# Patient Record
Sex: Female | Born: 1960 | Race: White | Hispanic: No | Smoking: Current every day smoker
Health system: Southern US, Community
[De-identification: ages and names within clinical notes are randomized; demographics above are authoritative.]

## PROBLEM LIST (undated history)

## (undated) DIAGNOSIS — J069 Acute upper respiratory infection, unspecified: Secondary | ICD-10-CM

## (undated) DIAGNOSIS — R569 Unspecified convulsions: Secondary | ICD-10-CM

## (undated) DIAGNOSIS — D839 Common variable immunodeficiency, unspecified: Secondary | ICD-10-CM

## (undated) DIAGNOSIS — J45909 Unspecified asthma, uncomplicated: Secondary | ICD-10-CM

## (undated) DIAGNOSIS — E119 Type 2 diabetes mellitus without complications: Secondary | ICD-10-CM

## (undated) DIAGNOSIS — I10 Essential (primary) hypertension: Secondary | ICD-10-CM

## (undated) DIAGNOSIS — I208 Other forms of angina pectoris: Secondary | ICD-10-CM

## (undated) HISTORY — PX: OTHER SURGICAL HISTORY: SHX169

## (undated) HISTORY — DX: Acute upper respiratory infection, unspecified: J06.9

## (undated) HISTORY — PX: APPENDECTOMY: SHX54

## (undated) HISTORY — DX: Common variable immunodeficiency, unspecified: D83.9

## (undated) HISTORY — PX: HERNIA REPAIR: SHX51

## (undated) HISTORY — DX: Type 2 diabetes mellitus without complications: E11.9

## (undated) HISTORY — PX: CHOLECYSTECTOMY: SHX55

## (undated) HISTORY — PX: BACK SURGERY: SHX140

## (undated) HISTORY — PX: SHOULDER SURGERY: SHX246

## (undated) HISTORY — DX: Unspecified asthma, uncomplicated: J45.909

## (undated) HISTORY — DX: Other forms of angina pectoris: I20.8

---

## 1998-11-26 ENCOUNTER — Observation Stay (HOSPITAL_COMMUNITY): Admission: RE | Admit: 1998-11-26 | Discharge: 1998-11-27 | Payer: Self-pay | Admitting: Specialist

## 1998-11-26 ENCOUNTER — Encounter: Payer: Self-pay | Admitting: Specialist

## 1999-03-31 ENCOUNTER — Encounter: Payer: Self-pay | Admitting: Specialist

## 1999-03-31 ENCOUNTER — Ambulatory Visit (HOSPITAL_COMMUNITY): Admission: RE | Admit: 1999-03-31 | Discharge: 1999-03-31 | Payer: Self-pay | Admitting: Specialist

## 1999-06-25 ENCOUNTER — Encounter: Payer: Self-pay | Admitting: Specialist

## 1999-06-25 ENCOUNTER — Ambulatory Visit (HOSPITAL_COMMUNITY): Admission: RE | Admit: 1999-06-25 | Discharge: 1999-06-25 | Payer: Self-pay | Admitting: Specialist

## 1999-07-14 ENCOUNTER — Encounter: Payer: Self-pay | Admitting: Specialist

## 1999-07-15 ENCOUNTER — Inpatient Hospital Stay (HOSPITAL_COMMUNITY): Admission: EM | Admit: 1999-07-15 | Discharge: 1999-07-16 | Payer: Self-pay | Admitting: Specialist

## 2000-03-04 ENCOUNTER — Ambulatory Visit (HOSPITAL_COMMUNITY): Admission: RE | Admit: 2000-03-04 | Discharge: 2000-03-04 | Payer: Self-pay | Admitting: Specialist

## 2000-03-04 ENCOUNTER — Encounter: Payer: Self-pay | Admitting: Specialist

## 2000-06-01 ENCOUNTER — Encounter: Payer: Self-pay | Admitting: Specialist

## 2000-06-08 ENCOUNTER — Inpatient Hospital Stay (HOSPITAL_COMMUNITY): Admission: RE | Admit: 2000-06-08 | Discharge: 2000-06-12 | Payer: Self-pay | Admitting: Specialist

## 2000-06-08 ENCOUNTER — Encounter: Payer: Self-pay | Admitting: Specialist

## 2001-01-11 ENCOUNTER — Encounter: Payer: Self-pay | Admitting: Specialist

## 2001-01-11 ENCOUNTER — Inpatient Hospital Stay (HOSPITAL_COMMUNITY): Admission: RE | Admit: 2001-01-11 | Discharge: 2001-01-13 | Payer: Self-pay | Admitting: Specialist

## 2001-07-19 ENCOUNTER — Ambulatory Visit (HOSPITAL_COMMUNITY): Admission: RE | Admit: 2001-07-19 | Discharge: 2001-07-19 | Payer: Self-pay | Admitting: Specialist

## 2001-07-19 ENCOUNTER — Encounter: Payer: Self-pay | Admitting: Specialist

## 2001-10-06 ENCOUNTER — Encounter: Admission: RE | Admit: 2001-10-06 | Discharge: 2002-01-04 | Payer: Self-pay

## 2002-01-30 ENCOUNTER — Encounter: Admission: RE | Admit: 2002-01-30 | Discharge: 2002-04-30 | Payer: Self-pay

## 2002-05-22 ENCOUNTER — Encounter: Admission: RE | Admit: 2002-05-22 | Discharge: 2002-05-29 | Payer: Self-pay

## 2002-06-15 ENCOUNTER — Encounter: Admission: RE | Admit: 2002-06-15 | Discharge: 2002-09-13 | Payer: Self-pay

## 2004-01-04 ENCOUNTER — Emergency Department (HOSPITAL_COMMUNITY): Admission: EM | Admit: 2004-01-04 | Discharge: 2004-01-04 | Payer: Self-pay | Admitting: Emergency Medicine

## 2006-03-09 ENCOUNTER — Emergency Department (HOSPITAL_COMMUNITY): Admission: EM | Admit: 2006-03-09 | Discharge: 2006-03-09 | Payer: Self-pay | Admitting: Emergency Medicine

## 2011-08-31 DIAGNOSIS — I10 Essential (primary) hypertension: Secondary | ICD-10-CM | POA: Diagnosis not present

## 2011-08-31 DIAGNOSIS — I2089 Other forms of angina pectoris: Secondary | ICD-10-CM | POA: Insufficient documentation

## 2011-08-31 DIAGNOSIS — R0789 Other chest pain: Secondary | ICD-10-CM

## 2011-08-31 DIAGNOSIS — J40 Bronchitis, not specified as acute or chronic: Secondary | ICD-10-CM | POA: Diagnosis not present

## 2011-08-31 DIAGNOSIS — I208 Other forms of angina pectoris: Secondary | ICD-10-CM

## 2011-08-31 DIAGNOSIS — Z79899 Other long term (current) drug therapy: Secondary | ICD-10-CM | POA: Diagnosis not present

## 2011-08-31 DIAGNOSIS — F17200 Nicotine dependence, unspecified, uncomplicated: Secondary | ICD-10-CM | POA: Diagnosis not present

## 2011-08-31 DIAGNOSIS — J111 Influenza due to unidentified influenza virus with other respiratory manifestations: Secondary | ICD-10-CM | POA: Diagnosis not present

## 2011-08-31 DIAGNOSIS — I4891 Unspecified atrial fibrillation: Secondary | ICD-10-CM | POA: Diagnosis not present

## 2011-08-31 DIAGNOSIS — R05 Cough: Secondary | ICD-10-CM | POA: Diagnosis not present

## 2011-08-31 DIAGNOSIS — E785 Hyperlipidemia, unspecified: Secondary | ICD-10-CM | POA: Diagnosis not present

## 2011-08-31 DIAGNOSIS — J45909 Unspecified asthma, uncomplicated: Secondary | ICD-10-CM | POA: Diagnosis not present

## 2011-08-31 HISTORY — DX: Other chest pain: R07.89

## 2011-08-31 HISTORY — DX: Other forms of angina pectoris: I20.89

## 2011-08-31 HISTORY — DX: Other forms of angina pectoris: I20.8

## 2011-09-08 DIAGNOSIS — H04129 Dry eye syndrome of unspecified lacrimal gland: Secondary | ICD-10-CM | POA: Diagnosis not present

## 2011-09-08 DIAGNOSIS — H401290 Low-tension glaucoma, unspecified eye, stage unspecified: Secondary | ICD-10-CM | POA: Diagnosis not present

## 2011-09-08 DIAGNOSIS — H47219 Primary optic atrophy, unspecified eye: Secondary | ICD-10-CM | POA: Diagnosis not present

## 2011-09-08 DIAGNOSIS — R002 Palpitations: Secondary | ICD-10-CM | POA: Diagnosis not present

## 2011-09-08 DIAGNOSIS — R079 Chest pain, unspecified: Secondary | ICD-10-CM | POA: Diagnosis not present

## 2011-09-13 DIAGNOSIS — L723 Sebaceous cyst: Secondary | ICD-10-CM | POA: Diagnosis not present

## 2011-09-28 DIAGNOSIS — L723 Sebaceous cyst: Secondary | ICD-10-CM | POA: Diagnosis not present

## 2011-10-06 DIAGNOSIS — G4733 Obstructive sleep apnea (adult) (pediatric): Secondary | ICD-10-CM | POA: Diagnosis not present

## 2011-10-06 DIAGNOSIS — E785 Hyperlipidemia, unspecified: Secondary | ICD-10-CM | POA: Diagnosis not present

## 2011-10-06 DIAGNOSIS — I471 Supraventricular tachycardia: Secondary | ICD-10-CM | POA: Diagnosis not present

## 2011-10-06 DIAGNOSIS — I10 Essential (primary) hypertension: Secondary | ICD-10-CM | POA: Diagnosis not present

## 2011-10-13 DIAGNOSIS — R079 Chest pain, unspecified: Secondary | ICD-10-CM | POA: Diagnosis not present

## 2011-10-13 DIAGNOSIS — G4730 Sleep apnea, unspecified: Secondary | ICD-10-CM | POA: Diagnosis not present

## 2011-10-25 DIAGNOSIS — I2510 Atherosclerotic heart disease of native coronary artery without angina pectoris: Secondary | ICD-10-CM | POA: Diagnosis not present

## 2011-10-25 DIAGNOSIS — I119 Hypertensive heart disease without heart failure: Secondary | ICD-10-CM | POA: Diagnosis not present

## 2011-10-25 DIAGNOSIS — R079 Chest pain, unspecified: Secondary | ICD-10-CM | POA: Diagnosis not present

## 2011-11-01 DIAGNOSIS — Z79899 Other long term (current) drug therapy: Secondary | ICD-10-CM | POA: Diagnosis not present

## 2011-11-05 DIAGNOSIS — J3089 Other allergic rhinitis: Secondary | ICD-10-CM | POA: Diagnosis not present

## 2011-11-05 DIAGNOSIS — G2581 Restless legs syndrome: Secondary | ICD-10-CM | POA: Diagnosis not present

## 2011-11-05 DIAGNOSIS — I10 Essential (primary) hypertension: Secondary | ICD-10-CM | POA: Diagnosis not present

## 2011-11-05 DIAGNOSIS — K219 Gastro-esophageal reflux disease without esophagitis: Secondary | ICD-10-CM | POA: Diagnosis not present

## 2011-11-11 DIAGNOSIS — E559 Vitamin D deficiency, unspecified: Secondary | ICD-10-CM | POA: Diagnosis not present

## 2011-11-11 DIAGNOSIS — R0989 Other specified symptoms and signs involving the circulatory and respiratory systems: Secondary | ICD-10-CM | POA: Diagnosis not present

## 2011-11-11 DIAGNOSIS — J310 Chronic rhinitis: Secondary | ICD-10-CM | POA: Diagnosis not present

## 2011-11-11 DIAGNOSIS — G2581 Restless legs syndrome: Secondary | ICD-10-CM | POA: Diagnosis not present

## 2011-12-06 DIAGNOSIS — G894 Chronic pain syndrome: Secondary | ICD-10-CM | POA: Diagnosis not present

## 2011-12-06 DIAGNOSIS — Z5181 Encounter for therapeutic drug level monitoring: Secondary | ICD-10-CM | POA: Diagnosis not present

## 2011-12-06 DIAGNOSIS — IMO0002 Reserved for concepts with insufficient information to code with codable children: Secondary | ICD-10-CM | POA: Diagnosis not present

## 2011-12-06 DIAGNOSIS — M961 Postlaminectomy syndrome, not elsewhere classified: Secondary | ICD-10-CM | POA: Diagnosis not present

## 2011-12-20 DIAGNOSIS — G4730 Sleep apnea, unspecified: Secondary | ICD-10-CM | POA: Diagnosis not present

## 2011-12-20 DIAGNOSIS — E785 Hyperlipidemia, unspecified: Secondary | ICD-10-CM | POA: Diagnosis not present

## 2011-12-20 DIAGNOSIS — I10 Essential (primary) hypertension: Secondary | ICD-10-CM | POA: Diagnosis not present

## 2011-12-20 DIAGNOSIS — R51 Headache: Secondary | ICD-10-CM | POA: Diagnosis not present

## 2011-12-21 DIAGNOSIS — R5381 Other malaise: Secondary | ICD-10-CM | POA: Diagnosis not present

## 2011-12-21 DIAGNOSIS — R0989 Other specified symptoms and signs involving the circulatory and respiratory systems: Secondary | ICD-10-CM | POA: Diagnosis not present

## 2011-12-21 DIAGNOSIS — G2581 Restless legs syndrome: Secondary | ICD-10-CM | POA: Diagnosis not present

## 2011-12-21 DIAGNOSIS — R0609 Other forms of dyspnea: Secondary | ICD-10-CM | POA: Diagnosis not present

## 2011-12-21 DIAGNOSIS — G4700 Insomnia, unspecified: Secondary | ICD-10-CM | POA: Diagnosis not present

## 2012-01-03 DIAGNOSIS — IMO0002 Reserved for concepts with insufficient information to code with codable children: Secondary | ICD-10-CM | POA: Diagnosis not present

## 2012-01-03 DIAGNOSIS — M961 Postlaminectomy syndrome, not elsewhere classified: Secondary | ICD-10-CM | POA: Diagnosis not present

## 2012-01-03 DIAGNOSIS — G894 Chronic pain syndrome: Secondary | ICD-10-CM | POA: Diagnosis not present

## 2012-01-15 DIAGNOSIS — I4891 Unspecified atrial fibrillation: Secondary | ICD-10-CM | POA: Diagnosis not present

## 2012-01-15 DIAGNOSIS — J449 Chronic obstructive pulmonary disease, unspecified: Secondary | ICD-10-CM | POA: Diagnosis not present

## 2012-01-15 DIAGNOSIS — F17200 Nicotine dependence, unspecified, uncomplicated: Secondary | ICD-10-CM | POA: Diagnosis not present

## 2012-01-15 DIAGNOSIS — K219 Gastro-esophageal reflux disease without esophagitis: Secondary | ICD-10-CM | POA: Diagnosis not present

## 2012-01-15 DIAGNOSIS — E7800 Pure hypercholesterolemia, unspecified: Secondary | ICD-10-CM | POA: Diagnosis not present

## 2012-01-15 DIAGNOSIS — I10 Essential (primary) hypertension: Secondary | ICD-10-CM | POA: Diagnosis not present

## 2012-01-15 DIAGNOSIS — M62838 Other muscle spasm: Secondary | ICD-10-CM | POA: Diagnosis not present

## 2012-01-26 DIAGNOSIS — E559 Vitamin D deficiency, unspecified: Secondary | ICD-10-CM | POA: Diagnosis not present

## 2012-01-26 DIAGNOSIS — R0989 Other specified symptoms and signs involving the circulatory and respiratory systems: Secondary | ICD-10-CM | POA: Diagnosis not present

## 2012-01-26 DIAGNOSIS — R0609 Other forms of dyspnea: Secondary | ICD-10-CM | POA: Diagnosis not present

## 2012-01-26 DIAGNOSIS — G2581 Restless legs syndrome: Secondary | ICD-10-CM | POA: Diagnosis not present

## 2012-01-26 DIAGNOSIS — J310 Chronic rhinitis: Secondary | ICD-10-CM | POA: Diagnosis not present

## 2012-02-02 DIAGNOSIS — M961 Postlaminectomy syndrome, not elsewhere classified: Secondary | ICD-10-CM | POA: Diagnosis not present

## 2012-02-02 DIAGNOSIS — G894 Chronic pain syndrome: Secondary | ICD-10-CM | POA: Diagnosis not present

## 2012-03-03 DIAGNOSIS — J441 Chronic obstructive pulmonary disease with (acute) exacerbation: Secondary | ICD-10-CM | POA: Diagnosis not present

## 2012-03-06 DIAGNOSIS — G894 Chronic pain syndrome: Secondary | ICD-10-CM | POA: Diagnosis not present

## 2012-03-06 DIAGNOSIS — M961 Postlaminectomy syndrome, not elsewhere classified: Secondary | ICD-10-CM | POA: Diagnosis not present

## 2012-03-06 DIAGNOSIS — IMO0002 Reserved for concepts with insufficient information to code with codable children: Secondary | ICD-10-CM | POA: Diagnosis not present

## 2012-03-21 DIAGNOSIS — R0989 Other specified symptoms and signs involving the circulatory and respiratory systems: Secondary | ICD-10-CM | POA: Diagnosis not present

## 2012-03-21 DIAGNOSIS — R5383 Other fatigue: Secondary | ICD-10-CM | POA: Diagnosis not present

## 2012-03-21 DIAGNOSIS — R0609 Other forms of dyspnea: Secondary | ICD-10-CM | POA: Diagnosis not present

## 2012-03-21 DIAGNOSIS — R5381 Other malaise: Secondary | ICD-10-CM | POA: Diagnosis not present

## 2012-03-21 DIAGNOSIS — G2581 Restless legs syndrome: Secondary | ICD-10-CM | POA: Diagnosis not present

## 2012-03-21 DIAGNOSIS — G4700 Insomnia, unspecified: Secondary | ICD-10-CM | POA: Diagnosis not present

## 2012-04-03 DIAGNOSIS — Z5181 Encounter for therapeutic drug level monitoring: Secondary | ICD-10-CM | POA: Diagnosis not present

## 2012-04-03 DIAGNOSIS — G894 Chronic pain syndrome: Secondary | ICD-10-CM | POA: Diagnosis not present

## 2012-04-03 DIAGNOSIS — IMO0002 Reserved for concepts with insufficient information to code with codable children: Secondary | ICD-10-CM | POA: Diagnosis not present

## 2012-04-03 DIAGNOSIS — M961 Postlaminectomy syndrome, not elsewhere classified: Secondary | ICD-10-CM | POA: Diagnosis not present

## 2012-04-24 DIAGNOSIS — R222 Localized swelling, mass and lump, trunk: Secondary | ICD-10-CM | POA: Diagnosis not present

## 2012-04-26 DIAGNOSIS — R222 Localized swelling, mass and lump, trunk: Secondary | ICD-10-CM | POA: Diagnosis not present

## 2012-04-26 DIAGNOSIS — R911 Solitary pulmonary nodule: Secondary | ICD-10-CM | POA: Diagnosis not present

## 2012-05-03 DIAGNOSIS — G894 Chronic pain syndrome: Secondary | ICD-10-CM | POA: Diagnosis not present

## 2012-05-03 DIAGNOSIS — IMO0002 Reserved for concepts with insufficient information to code with codable children: Secondary | ICD-10-CM | POA: Diagnosis not present

## 2012-05-03 DIAGNOSIS — M961 Postlaminectomy syndrome, not elsewhere classified: Secondary | ICD-10-CM | POA: Diagnosis not present

## 2012-05-22 DIAGNOSIS — R5381 Other malaise: Secondary | ICD-10-CM | POA: Diagnosis not present

## 2012-05-22 DIAGNOSIS — G4700 Insomnia, unspecified: Secondary | ICD-10-CM | POA: Diagnosis not present

## 2012-05-22 DIAGNOSIS — J45901 Unspecified asthma with (acute) exacerbation: Secondary | ICD-10-CM | POA: Diagnosis not present

## 2012-05-22 DIAGNOSIS — R5383 Other fatigue: Secondary | ICD-10-CM | POA: Diagnosis not present

## 2012-05-22 DIAGNOSIS — J310 Chronic rhinitis: Secondary | ICD-10-CM | POA: Diagnosis not present

## 2012-06-01 DIAGNOSIS — Z5181 Encounter for therapeutic drug level monitoring: Secondary | ICD-10-CM | POA: Diagnosis not present

## 2012-06-01 DIAGNOSIS — IMO0002 Reserved for concepts with insufficient information to code with codable children: Secondary | ICD-10-CM | POA: Diagnosis not present

## 2012-06-01 DIAGNOSIS — G894 Chronic pain syndrome: Secondary | ICD-10-CM | POA: Diagnosis not present

## 2012-06-21 DIAGNOSIS — R05 Cough: Secondary | ICD-10-CM | POA: Diagnosis not present

## 2012-06-21 DIAGNOSIS — Z23 Encounter for immunization: Secondary | ICD-10-CM | POA: Diagnosis not present

## 2012-06-21 DIAGNOSIS — J310 Chronic rhinitis: Secondary | ICD-10-CM | POA: Diagnosis not present

## 2012-06-21 DIAGNOSIS — R0609 Other forms of dyspnea: Secondary | ICD-10-CM | POA: Diagnosis not present

## 2012-06-21 DIAGNOSIS — R0989 Other specified symptoms and signs involving the circulatory and respiratory systems: Secondary | ICD-10-CM | POA: Diagnosis not present

## 2012-06-26 DIAGNOSIS — R0989 Other specified symptoms and signs involving the circulatory and respiratory systems: Secondary | ICD-10-CM | POA: Diagnosis not present

## 2012-06-26 DIAGNOSIS — R0609 Other forms of dyspnea: Secondary | ICD-10-CM | POA: Diagnosis not present

## 2012-06-26 DIAGNOSIS — R911 Solitary pulmonary nodule: Secondary | ICD-10-CM | POA: Diagnosis not present

## 2012-06-29 DIAGNOSIS — M961 Postlaminectomy syndrome, not elsewhere classified: Secondary | ICD-10-CM | POA: Diagnosis not present

## 2012-07-05 DIAGNOSIS — J309 Allergic rhinitis, unspecified: Secondary | ICD-10-CM | POA: Diagnosis not present

## 2012-07-18 DIAGNOSIS — E785 Hyperlipidemia, unspecified: Secondary | ICD-10-CM | POA: Diagnosis not present

## 2012-07-18 DIAGNOSIS — I10 Essential (primary) hypertension: Secondary | ICD-10-CM | POA: Diagnosis not present

## 2012-07-18 DIAGNOSIS — R0602 Shortness of breath: Secondary | ICD-10-CM | POA: Diagnosis not present

## 2012-07-18 DIAGNOSIS — I471 Supraventricular tachycardia: Secondary | ICD-10-CM | POA: Diagnosis not present

## 2012-07-18 DIAGNOSIS — R079 Chest pain, unspecified: Secondary | ICD-10-CM | POA: Diagnosis not present

## 2012-07-25 DIAGNOSIS — J45901 Unspecified asthma with (acute) exacerbation: Secondary | ICD-10-CM | POA: Diagnosis not present

## 2012-07-25 DIAGNOSIS — F17200 Nicotine dependence, unspecified, uncomplicated: Secondary | ICD-10-CM | POA: Diagnosis not present

## 2012-07-25 DIAGNOSIS — R05 Cough: Secondary | ICD-10-CM | POA: Diagnosis not present

## 2012-07-25 DIAGNOSIS — J310 Chronic rhinitis: Secondary | ICD-10-CM | POA: Diagnosis not present

## 2012-08-02 DIAGNOSIS — J069 Acute upper respiratory infection, unspecified: Secondary | ICD-10-CM | POA: Diagnosis not present

## 2012-08-02 DIAGNOSIS — M961 Postlaminectomy syndrome, not elsewhere classified: Secondary | ICD-10-CM | POA: Diagnosis not present

## 2012-08-02 DIAGNOSIS — Z5181 Encounter for therapeutic drug level monitoring: Secondary | ICD-10-CM | POA: Diagnosis not present

## 2012-08-31 DIAGNOSIS — Z5181 Encounter for therapeutic drug level monitoring: Secondary | ICD-10-CM | POA: Diagnosis not present

## 2012-08-31 DIAGNOSIS — M961 Postlaminectomy syndrome, not elsewhere classified: Secondary | ICD-10-CM | POA: Diagnosis not present

## 2012-09-12 DIAGNOSIS — G4700 Insomnia, unspecified: Secondary | ICD-10-CM | POA: Diagnosis not present

## 2012-09-12 DIAGNOSIS — J310 Chronic rhinitis: Secondary | ICD-10-CM | POA: Diagnosis not present

## 2012-09-12 DIAGNOSIS — E559 Vitamin D deficiency, unspecified: Secondary | ICD-10-CM | POA: Diagnosis not present

## 2012-09-12 DIAGNOSIS — R0989 Other specified symptoms and signs involving the circulatory and respiratory systems: Secondary | ICD-10-CM | POA: Diagnosis not present

## 2012-09-15 DIAGNOSIS — M545 Low back pain, unspecified: Secondary | ICD-10-CM | POA: Diagnosis not present

## 2012-09-15 DIAGNOSIS — M961 Postlaminectomy syndrome, not elsewhere classified: Secondary | ICD-10-CM | POA: Diagnosis not present

## 2012-09-20 DIAGNOSIS — M5126 Other intervertebral disc displacement, lumbar region: Secondary | ICD-10-CM | POA: Diagnosis not present

## 2012-09-20 DIAGNOSIS — M961 Postlaminectomy syndrome, not elsewhere classified: Secondary | ICD-10-CM | POA: Diagnosis not present

## 2012-09-22 DIAGNOSIS — R39198 Other difficulties with micturition: Secondary | ICD-10-CM | POA: Diagnosis not present

## 2012-09-22 DIAGNOSIS — R3915 Urgency of urination: Secondary | ICD-10-CM | POA: Diagnosis not present

## 2012-09-22 DIAGNOSIS — R3913 Splitting of urinary stream: Secondary | ICD-10-CM | POA: Diagnosis not present

## 2012-09-28 DIAGNOSIS — M961 Postlaminectomy syndrome, not elsewhere classified: Secondary | ICD-10-CM | POA: Diagnosis not present

## 2012-10-02 DIAGNOSIS — IMO0002 Reserved for concepts with insufficient information to code with codable children: Secondary | ICD-10-CM | POA: Diagnosis not present

## 2012-10-02 DIAGNOSIS — L723 Sebaceous cyst: Secondary | ICD-10-CM | POA: Diagnosis not present

## 2012-10-03 DIAGNOSIS — M961 Postlaminectomy syndrome, not elsewhere classified: Secondary | ICD-10-CM | POA: Diagnosis not present

## 2012-10-23 DIAGNOSIS — R296 Repeated falls: Secondary | ICD-10-CM | POA: Diagnosis not present

## 2012-10-23 DIAGNOSIS — F17200 Nicotine dependence, unspecified, uncomplicated: Secondary | ICD-10-CM | POA: Diagnosis not present

## 2012-10-23 DIAGNOSIS — I10 Essential (primary) hypertension: Secondary | ICD-10-CM | POA: Diagnosis not present

## 2012-10-23 DIAGNOSIS — K219 Gastro-esophageal reflux disease without esophagitis: Secondary | ICD-10-CM | POA: Diagnosis not present

## 2012-10-23 DIAGNOSIS — J449 Chronic obstructive pulmonary disease, unspecified: Secondary | ICD-10-CM | POA: Diagnosis not present

## 2012-10-23 DIAGNOSIS — S058X9A Other injuries of unspecified eye and orbit, initial encounter: Secondary | ICD-10-CM | POA: Diagnosis not present

## 2012-10-23 DIAGNOSIS — K439 Ventral hernia without obstruction or gangrene: Secondary | ICD-10-CM | POA: Diagnosis not present

## 2012-10-23 DIAGNOSIS — L723 Sebaceous cyst: Secondary | ICD-10-CM | POA: Diagnosis not present

## 2012-10-23 DIAGNOSIS — H1130 Conjunctival hemorrhage, unspecified eye: Secondary | ICD-10-CM | POA: Diagnosis not present

## 2012-10-25 DIAGNOSIS — M545 Low back pain: Secondary | ICD-10-CM | POA: Diagnosis not present

## 2012-10-25 DIAGNOSIS — Z5181 Encounter for therapeutic drug level monitoring: Secondary | ICD-10-CM | POA: Diagnosis not present

## 2012-10-25 DIAGNOSIS — M961 Postlaminectomy syndrome, not elsewhere classified: Secondary | ICD-10-CM | POA: Diagnosis not present

## 2012-10-26 DIAGNOSIS — H547 Unspecified visual loss: Secondary | ICD-10-CM | POA: Diagnosis not present

## 2012-10-26 DIAGNOSIS — H5710 Ocular pain, unspecified eye: Secondary | ICD-10-CM | POA: Diagnosis not present

## 2012-11-22 DIAGNOSIS — Z5181 Encounter for therapeutic drug level monitoring: Secondary | ICD-10-CM | POA: Diagnosis not present

## 2012-11-22 DIAGNOSIS — M961 Postlaminectomy syndrome, not elsewhere classified: Secondary | ICD-10-CM | POA: Diagnosis not present

## 2012-11-22 DIAGNOSIS — IMO0002 Reserved for concepts with insufficient information to code with codable children: Secondary | ICD-10-CM | POA: Diagnosis not present

## 2012-11-22 DIAGNOSIS — G894 Chronic pain syndrome: Secondary | ICD-10-CM | POA: Diagnosis not present

## 2012-11-24 DIAGNOSIS — M7989 Other specified soft tissue disorders: Secondary | ICD-10-CM | POA: Diagnosis not present

## 2012-11-24 DIAGNOSIS — R609 Edema, unspecified: Secondary | ICD-10-CM | POA: Diagnosis not present

## 2012-11-28 DIAGNOSIS — E785 Hyperlipidemia, unspecified: Secondary | ICD-10-CM | POA: Diagnosis not present

## 2012-11-28 DIAGNOSIS — I10 Essential (primary) hypertension: Secondary | ICD-10-CM | POA: Diagnosis not present

## 2012-11-28 DIAGNOSIS — R002 Palpitations: Secondary | ICD-10-CM | POA: Diagnosis not present

## 2012-11-28 DIAGNOSIS — R0602 Shortness of breath: Secondary | ICD-10-CM | POA: Diagnosis not present

## 2012-11-28 DIAGNOSIS — I471 Supraventricular tachycardia: Secondary | ICD-10-CM | POA: Diagnosis not present

## 2012-11-28 DIAGNOSIS — F17200 Nicotine dependence, unspecified, uncomplicated: Secondary | ICD-10-CM | POA: Diagnosis not present

## 2012-12-05 DIAGNOSIS — G4700 Insomnia, unspecified: Secondary | ICD-10-CM | POA: Diagnosis not present

## 2012-12-06 DIAGNOSIS — R0602 Shortness of breath: Secondary | ICD-10-CM | POA: Diagnosis not present

## 2012-12-06 DIAGNOSIS — E785 Hyperlipidemia, unspecified: Secondary | ICD-10-CM | POA: Diagnosis not present

## 2012-12-11 DIAGNOSIS — L723 Sebaceous cyst: Secondary | ICD-10-CM | POA: Diagnosis not present

## 2012-12-20 DIAGNOSIS — H353190 Nonexudative age-related macular degeneration, unspecified eye, stage unspecified: Secondary | ICD-10-CM | POA: Diagnosis not present

## 2012-12-20 DIAGNOSIS — H547 Unspecified visual loss: Secondary | ICD-10-CM | POA: Diagnosis not present

## 2012-12-21 DIAGNOSIS — R911 Solitary pulmonary nodule: Secondary | ICD-10-CM | POA: Diagnosis not present

## 2012-12-25 DIAGNOSIS — K432 Incisional hernia without obstruction or gangrene: Secondary | ICD-10-CM | POA: Diagnosis not present

## 2012-12-25 DIAGNOSIS — Z1211 Encounter for screening for malignant neoplasm of colon: Secondary | ICD-10-CM | POA: Diagnosis not present

## 2012-12-25 DIAGNOSIS — E669 Obesity, unspecified: Secondary | ICD-10-CM | POA: Diagnosis not present

## 2012-12-28 DIAGNOSIS — G4700 Insomnia, unspecified: Secondary | ICD-10-CM | POA: Diagnosis not present

## 2012-12-28 DIAGNOSIS — R0609 Other forms of dyspnea: Secondary | ICD-10-CM | POA: Diagnosis not present

## 2012-12-28 DIAGNOSIS — J984 Other disorders of lung: Secondary | ICD-10-CM | POA: Diagnosis not present

## 2012-12-28 DIAGNOSIS — E559 Vitamin D deficiency, unspecified: Secondary | ICD-10-CM | POA: Diagnosis not present

## 2012-12-28 DIAGNOSIS — G2581 Restless legs syndrome: Secondary | ICD-10-CM | POA: Diagnosis not present

## 2012-12-28 DIAGNOSIS — J310 Chronic rhinitis: Secondary | ICD-10-CM | POA: Diagnosis not present

## 2012-12-28 DIAGNOSIS — R0989 Other specified symptoms and signs involving the circulatory and respiratory systems: Secondary | ICD-10-CM | POA: Diagnosis not present

## 2013-01-01 DIAGNOSIS — Z01812 Encounter for preprocedural laboratory examination: Secondary | ICD-10-CM | POA: Diagnosis not present

## 2013-01-01 DIAGNOSIS — K4030 Unilateral inguinal hernia, with obstruction, without gangrene, not specified as recurrent: Secondary | ICD-10-CM | POA: Diagnosis not present

## 2013-01-01 DIAGNOSIS — M961 Postlaminectomy syndrome, not elsewhere classified: Secondary | ICD-10-CM | POA: Diagnosis not present

## 2013-01-01 DIAGNOSIS — Z5181 Encounter for therapeutic drug level monitoring: Secondary | ICD-10-CM | POA: Diagnosis not present

## 2013-01-01 DIAGNOSIS — Z01818 Encounter for other preprocedural examination: Secondary | ICD-10-CM | POA: Diagnosis not present

## 2013-01-05 DIAGNOSIS — K430 Incisional hernia with obstruction, without gangrene: Secondary | ICD-10-CM | POA: Diagnosis not present

## 2013-01-05 DIAGNOSIS — I10 Essential (primary) hypertension: Secondary | ICD-10-CM | POA: Diagnosis not present

## 2013-01-05 DIAGNOSIS — G8918 Other acute postprocedural pain: Secondary | ICD-10-CM | POA: Diagnosis not present

## 2013-01-05 DIAGNOSIS — Z79899 Other long term (current) drug therapy: Secondary | ICD-10-CM | POA: Diagnosis not present

## 2013-01-05 DIAGNOSIS — K432 Incisional hernia without obstruction or gangrene: Secondary | ICD-10-CM | POA: Diagnosis not present

## 2013-01-05 DIAGNOSIS — J45909 Unspecified asthma, uncomplicated: Secondary | ICD-10-CM | POA: Diagnosis not present

## 2013-01-05 DIAGNOSIS — E785 Hyperlipidemia, unspecified: Secondary | ICD-10-CM | POA: Diagnosis not present

## 2013-01-05 DIAGNOSIS — E669 Obesity, unspecified: Secondary | ICD-10-CM | POA: Diagnosis not present

## 2013-01-05 DIAGNOSIS — G4733 Obstructive sleep apnea (adult) (pediatric): Secondary | ICD-10-CM | POA: Diagnosis not present

## 2013-01-05 DIAGNOSIS — F17200 Nicotine dependence, unspecified, uncomplicated: Secondary | ICD-10-CM | POA: Diagnosis not present

## 2013-01-06 DIAGNOSIS — I10 Essential (primary) hypertension: Secondary | ICD-10-CM | POA: Diagnosis not present

## 2013-01-06 DIAGNOSIS — K430 Incisional hernia with obstruction, without gangrene: Secondary | ICD-10-CM | POA: Diagnosis not present

## 2013-01-06 DIAGNOSIS — G8918 Other acute postprocedural pain: Secondary | ICD-10-CM | POA: Diagnosis not present

## 2013-01-06 DIAGNOSIS — F17200 Nicotine dependence, unspecified, uncomplicated: Secondary | ICD-10-CM | POA: Diagnosis not present

## 2013-01-06 DIAGNOSIS — J45909 Unspecified asthma, uncomplicated: Secondary | ICD-10-CM | POA: Diagnosis not present

## 2013-01-06 DIAGNOSIS — E669 Obesity, unspecified: Secondary | ICD-10-CM | POA: Diagnosis not present

## 2013-01-07 DIAGNOSIS — F17200 Nicotine dependence, unspecified, uncomplicated: Secondary | ICD-10-CM | POA: Diagnosis not present

## 2013-01-07 DIAGNOSIS — K430 Incisional hernia with obstruction, without gangrene: Secondary | ICD-10-CM | POA: Diagnosis not present

## 2013-01-07 DIAGNOSIS — G8918 Other acute postprocedural pain: Secondary | ICD-10-CM | POA: Diagnosis not present

## 2013-01-07 DIAGNOSIS — J45909 Unspecified asthma, uncomplicated: Secondary | ICD-10-CM | POA: Diagnosis not present

## 2013-01-07 DIAGNOSIS — I10 Essential (primary) hypertension: Secondary | ICD-10-CM | POA: Diagnosis not present

## 2013-01-07 DIAGNOSIS — E669 Obesity, unspecified: Secondary | ICD-10-CM | POA: Diagnosis not present

## 2013-01-29 DIAGNOSIS — Z5181 Encounter for therapeutic drug level monitoring: Secondary | ICD-10-CM | POA: Diagnosis not present

## 2013-01-29 DIAGNOSIS — IMO0002 Reserved for concepts with insufficient information to code with codable children: Secondary | ICD-10-CM | POA: Diagnosis not present

## 2013-02-06 DIAGNOSIS — A938 Other specified arthropod-borne viral fevers: Secondary | ICD-10-CM | POA: Diagnosis not present

## 2013-02-13 DIAGNOSIS — F17200 Nicotine dependence, unspecified, uncomplicated: Secondary | ICD-10-CM | POA: Diagnosis not present

## 2013-02-13 DIAGNOSIS — I471 Supraventricular tachycardia: Secondary | ICD-10-CM | POA: Diagnosis not present

## 2013-02-13 DIAGNOSIS — I10 Essential (primary) hypertension: Secondary | ICD-10-CM | POA: Diagnosis not present

## 2013-02-13 DIAGNOSIS — R079 Chest pain, unspecified: Secondary | ICD-10-CM | POA: Diagnosis not present

## 2013-02-13 DIAGNOSIS — G4730 Sleep apnea, unspecified: Secondary | ICD-10-CM | POA: Diagnosis not present

## 2013-02-13 DIAGNOSIS — Z79899 Other long term (current) drug therapy: Secondary | ICD-10-CM | POA: Diagnosis not present

## 2013-02-13 DIAGNOSIS — E785 Hyperlipidemia, unspecified: Secondary | ICD-10-CM | POA: Diagnosis not present

## 2013-02-13 DIAGNOSIS — G4733 Obstructive sleep apnea (adult) (pediatric): Secondary | ICD-10-CM | POA: Diagnosis not present

## 2013-02-13 DIAGNOSIS — R002 Palpitations: Secondary | ICD-10-CM | POA: Diagnosis not present

## 2013-03-01 DIAGNOSIS — IMO0002 Reserved for concepts with insufficient information to code with codable children: Secondary | ICD-10-CM | POA: Diagnosis not present

## 2013-03-01 DIAGNOSIS — M545 Low back pain: Secondary | ICD-10-CM | POA: Diagnosis not present

## 2013-03-01 DIAGNOSIS — Z5181 Encounter for therapeutic drug level monitoring: Secondary | ICD-10-CM | POA: Diagnosis not present

## 2013-03-14 DIAGNOSIS — J4489 Other specified chronic obstructive pulmonary disease: Secondary | ICD-10-CM | POA: Diagnosis not present

## 2013-03-14 DIAGNOSIS — J449 Chronic obstructive pulmonary disease, unspecified: Secondary | ICD-10-CM | POA: Diagnosis not present

## 2013-03-14 DIAGNOSIS — I10 Essential (primary) hypertension: Secondary | ICD-10-CM | POA: Diagnosis not present

## 2013-03-14 DIAGNOSIS — J069 Acute upper respiratory infection, unspecified: Secondary | ICD-10-CM | POA: Diagnosis not present

## 2013-03-20 DIAGNOSIS — Z79899 Other long term (current) drug therapy: Secondary | ICD-10-CM | POA: Diagnosis not present

## 2013-03-20 DIAGNOSIS — F17200 Nicotine dependence, unspecified, uncomplicated: Secondary | ICD-10-CM | POA: Diagnosis not present

## 2013-03-20 DIAGNOSIS — R51 Headache: Secondary | ICD-10-CM | POA: Diagnosis not present

## 2013-03-20 DIAGNOSIS — R000 Tachycardia, unspecified: Secondary | ICD-10-CM | POA: Diagnosis not present

## 2013-03-20 DIAGNOSIS — R072 Precordial pain: Secondary | ICD-10-CM | POA: Diagnosis not present

## 2013-03-20 DIAGNOSIS — R0602 Shortness of breath: Secondary | ICD-10-CM | POA: Diagnosis not present

## 2013-03-20 DIAGNOSIS — R5383 Other fatigue: Secondary | ICD-10-CM | POA: Diagnosis not present

## 2013-03-20 DIAGNOSIS — I10 Essential (primary) hypertension: Secondary | ICD-10-CM | POA: Diagnosis not present

## 2013-03-20 DIAGNOSIS — E785 Hyperlipidemia, unspecified: Secondary | ICD-10-CM | POA: Diagnosis not present

## 2013-03-20 DIAGNOSIS — R079 Chest pain, unspecified: Secondary | ICD-10-CM | POA: Diagnosis not present

## 2013-03-20 DIAGNOSIS — J449 Chronic obstructive pulmonary disease, unspecified: Secondary | ICD-10-CM | POA: Diagnosis not present

## 2013-03-21 DIAGNOSIS — J449 Chronic obstructive pulmonary disease, unspecified: Secondary | ICD-10-CM | POA: Diagnosis not present

## 2013-03-21 DIAGNOSIS — R079 Chest pain, unspecified: Secondary | ICD-10-CM | POA: Diagnosis not present

## 2013-03-21 DIAGNOSIS — I10 Essential (primary) hypertension: Secondary | ICD-10-CM | POA: Diagnosis not present

## 2013-03-21 DIAGNOSIS — E785 Hyperlipidemia, unspecified: Secondary | ICD-10-CM | POA: Diagnosis not present

## 2013-03-21 DIAGNOSIS — F17200 Nicotine dependence, unspecified, uncomplicated: Secondary | ICD-10-CM | POA: Diagnosis not present

## 2013-03-21 DIAGNOSIS — R0789 Other chest pain: Secondary | ICD-10-CM | POA: Diagnosis not present

## 2013-03-21 DIAGNOSIS — R000 Tachycardia, unspecified: Secondary | ICD-10-CM | POA: Diagnosis not present

## 2013-03-21 DIAGNOSIS — R51 Headache: Secondary | ICD-10-CM | POA: Diagnosis not present

## 2013-03-21 DIAGNOSIS — Z79899 Other long term (current) drug therapy: Secondary | ICD-10-CM | POA: Diagnosis not present

## 2013-03-26 DIAGNOSIS — E7800 Pure hypercholesterolemia, unspecified: Secondary | ICD-10-CM | POA: Diagnosis not present

## 2013-03-26 DIAGNOSIS — Z006 Encounter for examination for normal comparison and control in clinical research program: Secondary | ICD-10-CM | POA: Diagnosis not present

## 2013-03-26 DIAGNOSIS — R5383 Other fatigue: Secondary | ICD-10-CM | POA: Diagnosis not present

## 2013-03-26 DIAGNOSIS — R5381 Other malaise: Secondary | ICD-10-CM | POA: Diagnosis not present

## 2013-03-26 DIAGNOSIS — F17200 Nicotine dependence, unspecified, uncomplicated: Secondary | ICD-10-CM | POA: Diagnosis not present

## 2013-04-02 DIAGNOSIS — Z5181 Encounter for therapeutic drug level monitoring: Secondary | ICD-10-CM | POA: Diagnosis not present

## 2013-04-02 DIAGNOSIS — M545 Low back pain: Secondary | ICD-10-CM | POA: Diagnosis not present

## 2013-04-02 DIAGNOSIS — M5137 Other intervertebral disc degeneration, lumbosacral region: Secondary | ICD-10-CM | POA: Diagnosis not present

## 2013-04-09 DIAGNOSIS — R0989 Other specified symptoms and signs involving the circulatory and respiratory systems: Secondary | ICD-10-CM | POA: Diagnosis not present

## 2013-04-09 DIAGNOSIS — G4700 Insomnia, unspecified: Secondary | ICD-10-CM | POA: Diagnosis not present

## 2013-04-09 DIAGNOSIS — G2581 Restless legs syndrome: Secondary | ICD-10-CM | POA: Diagnosis not present

## 2013-04-09 DIAGNOSIS — J310 Chronic rhinitis: Secondary | ICD-10-CM | POA: Diagnosis not present

## 2013-04-09 DIAGNOSIS — E559 Vitamin D deficiency, unspecified: Secondary | ICD-10-CM | POA: Diagnosis not present

## 2013-04-09 DIAGNOSIS — R05 Cough: Secondary | ICD-10-CM | POA: Diagnosis not present

## 2013-04-09 DIAGNOSIS — J984 Other disorders of lung: Secondary | ICD-10-CM | POA: Diagnosis not present

## 2013-04-23 DIAGNOSIS — R5381 Other malaise: Secondary | ICD-10-CM | POA: Diagnosis not present

## 2013-04-23 DIAGNOSIS — I10 Essential (primary) hypertension: Secondary | ICD-10-CM | POA: Diagnosis not present

## 2013-04-25 DIAGNOSIS — J310 Chronic rhinitis: Secondary | ICD-10-CM | POA: Diagnosis not present

## 2013-05-02 DIAGNOSIS — J310 Chronic rhinitis: Secondary | ICD-10-CM | POA: Diagnosis not present

## 2013-05-03 DIAGNOSIS — IMO0002 Reserved for concepts with insufficient information to code with codable children: Secondary | ICD-10-CM | POA: Diagnosis not present

## 2013-05-03 DIAGNOSIS — M545 Low back pain: Secondary | ICD-10-CM | POA: Diagnosis not present

## 2013-05-04 DIAGNOSIS — Z79899 Other long term (current) drug therapy: Secondary | ICD-10-CM | POA: Diagnosis not present

## 2013-05-09 DIAGNOSIS — J310 Chronic rhinitis: Secondary | ICD-10-CM | POA: Diagnosis not present

## 2013-05-16 DIAGNOSIS — J310 Chronic rhinitis: Secondary | ICD-10-CM | POA: Diagnosis not present

## 2013-05-21 DIAGNOSIS — J45901 Unspecified asthma with (acute) exacerbation: Secondary | ICD-10-CM | POA: Diagnosis not present

## 2013-05-21 DIAGNOSIS — J209 Acute bronchitis, unspecified: Secondary | ICD-10-CM | POA: Diagnosis not present

## 2013-05-28 DIAGNOSIS — R05 Cough: Secondary | ICD-10-CM | POA: Diagnosis not present

## 2013-05-28 DIAGNOSIS — J310 Chronic rhinitis: Secondary | ICD-10-CM | POA: Diagnosis not present

## 2013-05-28 DIAGNOSIS — R0609 Other forms of dyspnea: Secondary | ICD-10-CM | POA: Diagnosis not present

## 2013-05-28 DIAGNOSIS — R911 Solitary pulmonary nodule: Secondary | ICD-10-CM | POA: Diagnosis not present

## 2013-05-28 DIAGNOSIS — G4700 Insomnia, unspecified: Secondary | ICD-10-CM | POA: Diagnosis not present

## 2013-05-31 DIAGNOSIS — M545 Low back pain: Secondary | ICD-10-CM | POA: Diagnosis not present

## 2013-05-31 DIAGNOSIS — IMO0002 Reserved for concepts with insufficient information to code with codable children: Secondary | ICD-10-CM | POA: Diagnosis not present

## 2013-06-01 DIAGNOSIS — R918 Other nonspecific abnormal finding of lung field: Secondary | ICD-10-CM | POA: Diagnosis not present

## 2013-06-01 DIAGNOSIS — J984 Other disorders of lung: Secondary | ICD-10-CM | POA: Diagnosis not present

## 2013-06-06 DIAGNOSIS — J310 Chronic rhinitis: Secondary | ICD-10-CM | POA: Diagnosis not present

## 2013-06-13 DIAGNOSIS — J310 Chronic rhinitis: Secondary | ICD-10-CM | POA: Diagnosis not present

## 2013-06-19 DIAGNOSIS — E669 Obesity, unspecified: Secondary | ICD-10-CM | POA: Diagnosis not present

## 2013-06-19 DIAGNOSIS — R109 Unspecified abdominal pain: Secondary | ICD-10-CM | POA: Diagnosis not present

## 2013-06-19 DIAGNOSIS — R1013 Epigastric pain: Secondary | ICD-10-CM | POA: Diagnosis not present

## 2013-06-20 DIAGNOSIS — R911 Solitary pulmonary nodule: Secondary | ICD-10-CM | POA: Diagnosis not present

## 2013-06-20 DIAGNOSIS — E559 Vitamin D deficiency, unspecified: Secondary | ICD-10-CM | POA: Diagnosis not present

## 2013-06-20 DIAGNOSIS — J310 Chronic rhinitis: Secondary | ICD-10-CM | POA: Diagnosis not present

## 2013-06-20 DIAGNOSIS — G4700 Insomnia, unspecified: Secondary | ICD-10-CM | POA: Diagnosis not present

## 2013-06-20 DIAGNOSIS — F411 Generalized anxiety disorder: Secondary | ICD-10-CM | POA: Diagnosis not present

## 2013-06-20 DIAGNOSIS — R0609 Other forms of dyspnea: Secondary | ICD-10-CM | POA: Diagnosis not present

## 2013-06-20 DIAGNOSIS — G2581 Restless legs syndrome: Secondary | ICD-10-CM | POA: Diagnosis not present

## 2013-06-21 DIAGNOSIS — R109 Unspecified abdominal pain: Secondary | ICD-10-CM | POA: Diagnosis not present

## 2013-07-02 DIAGNOSIS — M545 Low back pain: Secondary | ICD-10-CM | POA: Diagnosis not present

## 2013-07-02 DIAGNOSIS — Z5181 Encounter for therapeutic drug level monitoring: Secondary | ICD-10-CM | POA: Diagnosis not present

## 2013-07-02 DIAGNOSIS — IMO0002 Reserved for concepts with insufficient information to code with codable children: Secondary | ICD-10-CM | POA: Diagnosis not present

## 2013-07-04 DIAGNOSIS — J310 Chronic rhinitis: Secondary | ICD-10-CM | POA: Diagnosis not present

## 2013-07-11 DIAGNOSIS — I471 Supraventricular tachycardia: Secondary | ICD-10-CM | POA: Diagnosis not present

## 2013-07-11 DIAGNOSIS — F17200 Nicotine dependence, unspecified, uncomplicated: Secondary | ICD-10-CM | POA: Diagnosis not present

## 2013-07-11 DIAGNOSIS — E785 Hyperlipidemia, unspecified: Secondary | ICD-10-CM | POA: Diagnosis not present

## 2013-07-11 DIAGNOSIS — R002 Palpitations: Secondary | ICD-10-CM | POA: Diagnosis not present

## 2013-07-11 DIAGNOSIS — I10 Essential (primary) hypertension: Secondary | ICD-10-CM | POA: Diagnosis not present

## 2013-07-11 DIAGNOSIS — J310 Chronic rhinitis: Secondary | ICD-10-CM | POA: Diagnosis not present

## 2013-07-11 DIAGNOSIS — R079 Chest pain, unspecified: Secondary | ICD-10-CM | POA: Diagnosis not present

## 2013-07-25 DIAGNOSIS — J310 Chronic rhinitis: Secondary | ICD-10-CM | POA: Diagnosis not present

## 2013-08-02 DIAGNOSIS — IMO0002 Reserved for concepts with insufficient information to code with codable children: Secondary | ICD-10-CM | POA: Diagnosis not present

## 2013-08-02 DIAGNOSIS — M545 Low back pain: Secondary | ICD-10-CM | POA: Diagnosis not present

## 2013-08-02 DIAGNOSIS — J310 Chronic rhinitis: Secondary | ICD-10-CM | POA: Diagnosis not present

## 2013-08-07 DIAGNOSIS — J111 Influenza due to unidentified influenza virus with other respiratory manifestations: Secondary | ICD-10-CM | POA: Diagnosis not present

## 2013-08-13 DIAGNOSIS — J069 Acute upper respiratory infection, unspecified: Secondary | ICD-10-CM | POA: Diagnosis not present

## 2013-09-06 DIAGNOSIS — M545 Low back pain, unspecified: Secondary | ICD-10-CM | POA: Diagnosis not present

## 2013-09-19 DIAGNOSIS — I10 Essential (primary) hypertension: Secondary | ICD-10-CM | POA: Diagnosis not present

## 2013-09-19 DIAGNOSIS — E7800 Pure hypercholesterolemia, unspecified: Secondary | ICD-10-CM | POA: Diagnosis not present

## 2013-09-25 DIAGNOSIS — D1739 Benign lipomatous neoplasm of skin and subcutaneous tissue of other sites: Secondary | ICD-10-CM | POA: Diagnosis not present

## 2013-09-25 DIAGNOSIS — F17200 Nicotine dependence, unspecified, uncomplicated: Secondary | ICD-10-CM | POA: Diagnosis not present

## 2013-09-25 DIAGNOSIS — F411 Generalized anxiety disorder: Secondary | ICD-10-CM | POA: Diagnosis not present

## 2013-09-27 DIAGNOSIS — Z8679 Personal history of other diseases of the circulatory system: Secondary | ICD-10-CM | POA: Diagnosis not present

## 2013-09-27 DIAGNOSIS — R110 Nausea: Secondary | ICD-10-CM | POA: Diagnosis not present

## 2013-09-27 DIAGNOSIS — I10 Essential (primary) hypertension: Secondary | ICD-10-CM | POA: Diagnosis not present

## 2013-09-27 DIAGNOSIS — J449 Chronic obstructive pulmonary disease, unspecified: Secondary | ICD-10-CM | POA: Diagnosis not present

## 2013-09-27 DIAGNOSIS — R079 Chest pain, unspecified: Secondary | ICD-10-CM | POA: Diagnosis not present

## 2013-09-27 DIAGNOSIS — R0602 Shortness of breath: Secondary | ICD-10-CM | POA: Diagnosis not present

## 2013-09-27 DIAGNOSIS — R0789 Other chest pain: Secondary | ICD-10-CM | POA: Diagnosis not present

## 2013-09-27 DIAGNOSIS — F17200 Nicotine dependence, unspecified, uncomplicated: Secondary | ICD-10-CM | POA: Diagnosis not present

## 2013-10-03 DIAGNOSIS — R51 Headache: Secondary | ICD-10-CM | POA: Diagnosis not present

## 2013-10-03 DIAGNOSIS — R229 Localized swelling, mass and lump, unspecified: Secondary | ICD-10-CM | POA: Diagnosis not present

## 2013-10-03 DIAGNOSIS — F411 Generalized anxiety disorder: Secondary | ICD-10-CM | POA: Diagnosis not present

## 2013-10-03 DIAGNOSIS — F17200 Nicotine dependence, unspecified, uncomplicated: Secondary | ICD-10-CM | POA: Diagnosis not present

## 2013-10-08 DIAGNOSIS — IMO0002 Reserved for concepts with insufficient information to code with codable children: Secondary | ICD-10-CM | POA: Diagnosis not present

## 2013-10-08 DIAGNOSIS — M545 Low back pain, unspecified: Secondary | ICD-10-CM | POA: Diagnosis not present

## 2013-10-19 DIAGNOSIS — E669 Obesity, unspecified: Secondary | ICD-10-CM | POA: Diagnosis not present

## 2013-10-19 DIAGNOSIS — Z6831 Body mass index (BMI) 31.0-31.9, adult: Secondary | ICD-10-CM | POA: Diagnosis not present

## 2013-10-19 DIAGNOSIS — R1084 Generalized abdominal pain: Secondary | ICD-10-CM | POA: Diagnosis not present

## 2013-11-06 DIAGNOSIS — M545 Low back pain, unspecified: Secondary | ICD-10-CM | POA: Diagnosis not present

## 2013-11-06 DIAGNOSIS — IMO0002 Reserved for concepts with insufficient information to code with codable children: Secondary | ICD-10-CM | POA: Diagnosis not present

## 2013-11-08 DIAGNOSIS — Z79899 Other long term (current) drug therapy: Secondary | ICD-10-CM | POA: Diagnosis not present

## 2013-11-08 DIAGNOSIS — R002 Palpitations: Secondary | ICD-10-CM | POA: Diagnosis not present

## 2013-11-08 DIAGNOSIS — E785 Hyperlipidemia, unspecified: Secondary | ICD-10-CM | POA: Diagnosis not present

## 2013-11-08 DIAGNOSIS — I10 Essential (primary) hypertension: Secondary | ICD-10-CM | POA: Diagnosis not present

## 2013-11-08 DIAGNOSIS — R079 Chest pain, unspecified: Secondary | ICD-10-CM | POA: Diagnosis not present

## 2013-11-15 DIAGNOSIS — R109 Unspecified abdominal pain: Secondary | ICD-10-CM | POA: Diagnosis not present

## 2013-11-26 DIAGNOSIS — R079 Chest pain, unspecified: Secondary | ICD-10-CM | POA: Diagnosis not present

## 2013-11-27 DIAGNOSIS — G2581 Restless legs syndrome: Secondary | ICD-10-CM | POA: Diagnosis not present

## 2013-11-27 DIAGNOSIS — R0989 Other specified symptoms and signs involving the circulatory and respiratory systems: Secondary | ICD-10-CM | POA: Diagnosis not present

## 2013-11-27 DIAGNOSIS — R911 Solitary pulmonary nodule: Secondary | ICD-10-CM | POA: Diagnosis not present

## 2013-11-27 DIAGNOSIS — R0609 Other forms of dyspnea: Secondary | ICD-10-CM | POA: Diagnosis not present

## 2013-11-27 DIAGNOSIS — J310 Chronic rhinitis: Secondary | ICD-10-CM | POA: Diagnosis not present

## 2013-12-05 DIAGNOSIS — M545 Low back pain, unspecified: Secondary | ICD-10-CM | POA: Diagnosis not present

## 2013-12-05 DIAGNOSIS — IMO0002 Reserved for concepts with insufficient information to code with codable children: Secondary | ICD-10-CM | POA: Diagnosis not present

## 2013-12-10 DIAGNOSIS — R002 Palpitations: Secondary | ICD-10-CM | POA: Diagnosis not present

## 2013-12-11 DIAGNOSIS — S9030XA Contusion of unspecified foot, initial encounter: Secondary | ICD-10-CM | POA: Diagnosis not present

## 2013-12-11 DIAGNOSIS — J984 Other disorders of lung: Secondary | ICD-10-CM | POA: Diagnosis not present

## 2013-12-11 DIAGNOSIS — R911 Solitary pulmonary nodule: Secondary | ICD-10-CM | POA: Diagnosis not present

## 2013-12-11 DIAGNOSIS — S93409A Sprain of unspecified ligament of unspecified ankle, initial encounter: Secondary | ICD-10-CM | POA: Diagnosis not present

## 2013-12-14 DIAGNOSIS — S99929A Unspecified injury of unspecified foot, initial encounter: Secondary | ICD-10-CM | POA: Diagnosis not present

## 2013-12-14 DIAGNOSIS — Z79899 Other long term (current) drug therapy: Secondary | ICD-10-CM | POA: Diagnosis not present

## 2013-12-14 DIAGNOSIS — I4891 Unspecified atrial fibrillation: Secondary | ICD-10-CM | POA: Diagnosis not present

## 2013-12-14 DIAGNOSIS — F17200 Nicotine dependence, unspecified, uncomplicated: Secondary | ICD-10-CM | POA: Diagnosis not present

## 2013-12-14 DIAGNOSIS — K219 Gastro-esophageal reflux disease without esophagitis: Secondary | ICD-10-CM | POA: Diagnosis not present

## 2013-12-14 DIAGNOSIS — J438 Other emphysema: Secondary | ICD-10-CM | POA: Diagnosis not present

## 2013-12-14 DIAGNOSIS — M79609 Pain in unspecified limb: Secondary | ICD-10-CM | POA: Diagnosis not present

## 2013-12-14 DIAGNOSIS — S93409A Sprain of unspecified ligament of unspecified ankle, initial encounter: Secondary | ICD-10-CM | POA: Diagnosis not present

## 2013-12-14 DIAGNOSIS — I10 Essential (primary) hypertension: Secondary | ICD-10-CM | POA: Diagnosis not present

## 2013-12-14 DIAGNOSIS — S8990XA Unspecified injury of unspecified lower leg, initial encounter: Secondary | ICD-10-CM | POA: Diagnosis not present

## 2013-12-14 DIAGNOSIS — M7989 Other specified soft tissue disorders: Secondary | ICD-10-CM | POA: Diagnosis not present

## 2013-12-14 DIAGNOSIS — X503XXA Overexertion from repetitive movements, initial encounter: Secondary | ICD-10-CM | POA: Diagnosis not present

## 2013-12-14 DIAGNOSIS — E7800 Pure hypercholesterolemia, unspecified: Secondary | ICD-10-CM | POA: Diagnosis not present

## 2013-12-15 DIAGNOSIS — M79609 Pain in unspecified limb: Secondary | ICD-10-CM | POA: Diagnosis not present

## 2013-12-15 DIAGNOSIS — S8990XA Unspecified injury of unspecified lower leg, initial encounter: Secondary | ICD-10-CM | POA: Diagnosis not present

## 2013-12-15 DIAGNOSIS — M7989 Other specified soft tissue disorders: Secondary | ICD-10-CM | POA: Diagnosis not present

## 2013-12-15 DIAGNOSIS — S99919A Unspecified injury of unspecified ankle, initial encounter: Secondary | ICD-10-CM | POA: Diagnosis not present

## 2013-12-21 DIAGNOSIS — Z113 Encounter for screening for infections with a predominantly sexual mode of transmission: Secondary | ICD-10-CM | POA: Diagnosis not present

## 2013-12-21 DIAGNOSIS — R002 Palpitations: Secondary | ICD-10-CM | POA: Diagnosis not present

## 2013-12-21 DIAGNOSIS — M79609 Pain in unspecified limb: Secondary | ICD-10-CM | POA: Diagnosis not present

## 2013-12-21 DIAGNOSIS — J029 Acute pharyngitis, unspecified: Secondary | ICD-10-CM | POA: Diagnosis not present

## 2013-12-21 DIAGNOSIS — S93409A Sprain of unspecified ligament of unspecified ankle, initial encounter: Secondary | ICD-10-CM | POA: Diagnosis not present

## 2013-12-25 DIAGNOSIS — E785 Hyperlipidemia, unspecified: Secondary | ICD-10-CM | POA: Diagnosis not present

## 2013-12-25 DIAGNOSIS — I10 Essential (primary) hypertension: Secondary | ICD-10-CM | POA: Diagnosis not present

## 2013-12-25 DIAGNOSIS — I471 Supraventricular tachycardia: Secondary | ICD-10-CM | POA: Diagnosis not present

## 2013-12-26 DIAGNOSIS — J45909 Unspecified asthma, uncomplicated: Secondary | ICD-10-CM | POA: Diagnosis not present

## 2013-12-26 DIAGNOSIS — R918 Other nonspecific abnormal finding of lung field: Secondary | ICD-10-CM | POA: Diagnosis not present

## 2013-12-26 DIAGNOSIS — J310 Chronic rhinitis: Secondary | ICD-10-CM | POA: Diagnosis not present

## 2013-12-26 DIAGNOSIS — G4700 Insomnia, unspecified: Secondary | ICD-10-CM | POA: Diagnosis not present

## 2014-02-04 DIAGNOSIS — M961 Postlaminectomy syndrome, not elsewhere classified: Secondary | ICD-10-CM | POA: Diagnosis not present

## 2014-02-04 DIAGNOSIS — IMO0002 Reserved for concepts with insufficient information to code with codable children: Secondary | ICD-10-CM | POA: Diagnosis not present

## 2014-02-04 DIAGNOSIS — M545 Low back pain, unspecified: Secondary | ICD-10-CM | POA: Diagnosis not present

## 2014-02-06 DIAGNOSIS — J4489 Other specified chronic obstructive pulmonary disease: Secondary | ICD-10-CM | POA: Diagnosis not present

## 2014-02-06 DIAGNOSIS — J449 Chronic obstructive pulmonary disease, unspecified: Secondary | ICD-10-CM | POA: Diagnosis not present

## 2014-02-06 DIAGNOSIS — J40 Bronchitis, not specified as acute or chronic: Secondary | ICD-10-CM | POA: Diagnosis not present

## 2014-02-06 DIAGNOSIS — F17200 Nicotine dependence, unspecified, uncomplicated: Secondary | ICD-10-CM | POA: Diagnosis not present

## 2014-03-11 DIAGNOSIS — S5000XA Contusion of unspecified elbow, initial encounter: Secondary | ICD-10-CM | POA: Diagnosis not present

## 2014-03-11 DIAGNOSIS — IMO0002 Reserved for concepts with insufficient information to code with codable children: Secondary | ICD-10-CM | POA: Diagnosis not present

## 2014-03-11 DIAGNOSIS — Z1231 Encounter for screening mammogram for malignant neoplasm of breast: Secondary | ICD-10-CM | POA: Diagnosis not present

## 2014-03-12 DIAGNOSIS — H353190 Nonexudative age-related macular degeneration, unspecified eye, stage unspecified: Secondary | ICD-10-CM | POA: Diagnosis not present

## 2014-04-15 DIAGNOSIS — J310 Chronic rhinitis: Secondary | ICD-10-CM | POA: Diagnosis not present

## 2014-04-15 DIAGNOSIS — G2581 Restless legs syndrome: Secondary | ICD-10-CM | POA: Diagnosis not present

## 2014-04-15 DIAGNOSIS — R911 Solitary pulmonary nodule: Secondary | ICD-10-CM | POA: Diagnosis not present

## 2014-04-15 DIAGNOSIS — J45909 Unspecified asthma, uncomplicated: Secondary | ICD-10-CM | POA: Diagnosis not present

## 2014-05-30 DIAGNOSIS — M5416 Radiculopathy, lumbar region: Secondary | ICD-10-CM | POA: Diagnosis not present

## 2014-05-30 DIAGNOSIS — M545 Low back pain: Secondary | ICD-10-CM | POA: Diagnosis not present

## 2014-05-30 DIAGNOSIS — M961 Postlaminectomy syndrome, not elsewhere classified: Secondary | ICD-10-CM | POA: Diagnosis not present

## 2014-06-10 DIAGNOSIS — S60211A Contusion of right wrist, initial encounter: Secondary | ICD-10-CM | POA: Diagnosis not present

## 2014-06-10 DIAGNOSIS — M25522 Pain in left elbow: Secondary | ICD-10-CM | POA: Diagnosis not present

## 2014-06-10 DIAGNOSIS — M25422 Effusion, left elbow: Secondary | ICD-10-CM | POA: Diagnosis not present

## 2014-06-10 DIAGNOSIS — M25562 Pain in left knee: Secondary | ICD-10-CM | POA: Diagnosis not present

## 2014-06-10 DIAGNOSIS — W19XXXA Unspecified fall, initial encounter: Secondary | ICD-10-CM | POA: Diagnosis not present

## 2014-06-10 DIAGNOSIS — S52502A Unspecified fracture of the lower end of left radius, initial encounter for closed fracture: Secondary | ICD-10-CM | POA: Diagnosis not present

## 2014-06-10 DIAGNOSIS — S8992XA Unspecified injury of left lower leg, initial encounter: Secondary | ICD-10-CM | POA: Diagnosis not present

## 2014-06-10 DIAGNOSIS — S59912A Unspecified injury of left forearm, initial encounter: Secondary | ICD-10-CM | POA: Diagnosis not present

## 2014-06-10 DIAGNOSIS — Z9181 History of falling: Secondary | ICD-10-CM | POA: Diagnosis not present

## 2014-06-12 DIAGNOSIS — S42402A Unspecified fracture of lower end of left humerus, initial encounter for closed fracture: Secondary | ICD-10-CM | POA: Diagnosis not present

## 2014-06-12 DIAGNOSIS — M25522 Pain in left elbow: Secondary | ICD-10-CM | POA: Diagnosis not present

## 2014-06-20 DIAGNOSIS — Z23 Encounter for immunization: Secondary | ICD-10-CM | POA: Diagnosis not present

## 2014-06-20 DIAGNOSIS — J4520 Mild intermittent asthma, uncomplicated: Secondary | ICD-10-CM | POA: Diagnosis not present

## 2014-06-20 DIAGNOSIS — J310 Chronic rhinitis: Secondary | ICD-10-CM | POA: Diagnosis not present

## 2014-06-20 DIAGNOSIS — G2581 Restless legs syndrome: Secondary | ICD-10-CM | POA: Diagnosis not present

## 2014-06-20 DIAGNOSIS — F419 Anxiety disorder, unspecified: Secondary | ICD-10-CM | POA: Diagnosis not present

## 2014-07-05 DIAGNOSIS — S42402D Unspecified fracture of lower end of left humerus, subsequent encounter for fracture with routine healing: Secondary | ICD-10-CM | POA: Diagnosis not present

## 2014-07-31 DIAGNOSIS — Z1389 Encounter for screening for other disorder: Secondary | ICD-10-CM | POA: Diagnosis not present

## 2014-07-31 DIAGNOSIS — D519 Vitamin B12 deficiency anemia, unspecified: Secondary | ICD-10-CM | POA: Diagnosis not present

## 2014-07-31 DIAGNOSIS — M5416 Radiculopathy, lumbar region: Secondary | ICD-10-CM | POA: Diagnosis not present

## 2014-07-31 DIAGNOSIS — M961 Postlaminectomy syndrome, not elsewhere classified: Secondary | ICD-10-CM | POA: Diagnosis not present

## 2014-07-31 DIAGNOSIS — R5383 Other fatigue: Secondary | ICD-10-CM | POA: Diagnosis not present

## 2014-07-31 DIAGNOSIS — M545 Low back pain: Secondary | ICD-10-CM | POA: Diagnosis not present

## 2014-07-31 DIAGNOSIS — R5381 Other malaise: Secondary | ICD-10-CM | POA: Diagnosis not present

## 2014-07-31 DIAGNOSIS — M5414 Radiculopathy, thoracic region: Secondary | ICD-10-CM | POA: Diagnosis not present

## 2014-07-31 DIAGNOSIS — E780 Pure hypercholesterolemia: Secondary | ICD-10-CM | POA: Diagnosis not present

## 2014-07-31 DIAGNOSIS — J3089 Other allergic rhinitis: Secondary | ICD-10-CM | POA: Diagnosis not present

## 2014-08-12 DIAGNOSIS — S5002XA Contusion of left elbow, initial encounter: Secondary | ICD-10-CM | POA: Diagnosis not present

## 2014-08-12 DIAGNOSIS — M25512 Pain in left shoulder: Secondary | ICD-10-CM | POA: Diagnosis not present

## 2014-08-19 DIAGNOSIS — I10 Essential (primary) hypertension: Secondary | ICD-10-CM | POA: Diagnosis not present

## 2014-08-19 DIAGNOSIS — R0602 Shortness of breath: Secondary | ICD-10-CM | POA: Diagnosis not present

## 2014-08-19 DIAGNOSIS — F17200 Nicotine dependence, unspecified, uncomplicated: Secondary | ICD-10-CM | POA: Diagnosis not present

## 2014-08-19 DIAGNOSIS — E785 Hyperlipidemia, unspecified: Secondary | ICD-10-CM | POA: Diagnosis not present

## 2014-08-27 DIAGNOSIS — R0602 Shortness of breath: Secondary | ICD-10-CM | POA: Diagnosis not present

## 2014-08-27 DIAGNOSIS — R079 Chest pain, unspecified: Secondary | ICD-10-CM | POA: Diagnosis not present

## 2014-09-04 DIAGNOSIS — F411 Generalized anxiety disorder: Secondary | ICD-10-CM | POA: Diagnosis not present

## 2014-09-04 DIAGNOSIS — J4520 Mild intermittent asthma, uncomplicated: Secondary | ICD-10-CM | POA: Diagnosis not present

## 2014-09-04 DIAGNOSIS — R5383 Other fatigue: Secondary | ICD-10-CM | POA: Diagnosis not present

## 2014-09-04 DIAGNOSIS — J329 Chronic sinusitis, unspecified: Secondary | ICD-10-CM | POA: Diagnosis not present

## 2014-09-09 DIAGNOSIS — M5416 Radiculopathy, lumbar region: Secondary | ICD-10-CM | POA: Diagnosis not present

## 2014-09-09 DIAGNOSIS — M545 Low back pain: Secondary | ICD-10-CM | POA: Diagnosis not present

## 2014-09-09 DIAGNOSIS — M5414 Radiculopathy, thoracic region: Secondary | ICD-10-CM | POA: Diagnosis not present

## 2014-09-09 DIAGNOSIS — G8929 Other chronic pain: Secondary | ICD-10-CM | POA: Diagnosis not present

## 2014-09-09 DIAGNOSIS — M961 Postlaminectomy syndrome, not elsewhere classified: Secondary | ICD-10-CM | POA: Diagnosis not present

## 2014-09-10 DIAGNOSIS — Z79899 Other long term (current) drug therapy: Secondary | ICD-10-CM | POA: Diagnosis not present

## 2014-09-10 DIAGNOSIS — R079 Chest pain, unspecified: Secondary | ICD-10-CM | POA: Diagnosis not present

## 2014-09-10 DIAGNOSIS — E785 Hyperlipidemia, unspecified: Secondary | ICD-10-CM | POA: Diagnosis not present

## 2014-09-10 DIAGNOSIS — G4730 Sleep apnea, unspecified: Secondary | ICD-10-CM | POA: Diagnosis not present

## 2014-09-10 DIAGNOSIS — R0602 Shortness of breath: Secondary | ICD-10-CM | POA: Diagnosis not present

## 2014-09-10 DIAGNOSIS — I10 Essential (primary) hypertension: Secondary | ICD-10-CM | POA: Diagnosis not present

## 2014-09-10 DIAGNOSIS — R5383 Other fatigue: Secondary | ICD-10-CM | POA: Diagnosis not present

## 2014-09-17 DIAGNOSIS — H524 Presbyopia: Secondary | ICD-10-CM | POA: Diagnosis not present

## 2014-09-17 DIAGNOSIS — H3531 Nonexudative age-related macular degeneration: Secondary | ICD-10-CM | POA: Diagnosis not present

## 2014-09-17 DIAGNOSIS — H04123 Dry eye syndrome of bilateral lacrimal glands: Secondary | ICD-10-CM | POA: Diagnosis not present

## 2014-09-26 DIAGNOSIS — I119 Hypertensive heart disease without heart failure: Secondary | ICD-10-CM | POA: Diagnosis not present

## 2014-09-26 DIAGNOSIS — R079 Chest pain, unspecified: Secondary | ICD-10-CM | POA: Diagnosis not present

## 2014-09-26 DIAGNOSIS — R002 Palpitations: Secondary | ICD-10-CM | POA: Diagnosis not present

## 2014-09-26 DIAGNOSIS — R0602 Shortness of breath: Secondary | ICD-10-CM | POA: Diagnosis not present

## 2014-11-15 DIAGNOSIS — Z720 Tobacco use: Secondary | ICD-10-CM | POA: Diagnosis not present

## 2014-11-15 DIAGNOSIS — D519 Vitamin B12 deficiency anemia, unspecified: Secondary | ICD-10-CM | POA: Diagnosis not present

## 2014-11-15 DIAGNOSIS — R42 Dizziness and giddiness: Secondary | ICD-10-CM | POA: Diagnosis not present

## 2014-11-15 DIAGNOSIS — Z79899 Other long term (current) drug therapy: Secondary | ICD-10-CM | POA: Diagnosis not present

## 2014-11-19 DIAGNOSIS — Z1389 Encounter for screening for other disorder: Secondary | ICD-10-CM | POA: Diagnosis not present

## 2014-11-19 DIAGNOSIS — R0789 Other chest pain: Secondary | ICD-10-CM | POA: Diagnosis not present

## 2014-11-19 DIAGNOSIS — G8929 Other chronic pain: Secondary | ICD-10-CM | POA: Diagnosis not present

## 2014-11-19 DIAGNOSIS — M549 Dorsalgia, unspecified: Secondary | ICD-10-CM | POA: Diagnosis not present

## 2014-11-19 DIAGNOSIS — R51 Headache: Secondary | ICD-10-CM | POA: Diagnosis not present

## 2014-11-25 DIAGNOSIS — R51 Headache: Secondary | ICD-10-CM | POA: Diagnosis not present

## 2014-11-25 DIAGNOSIS — R42 Dizziness and giddiness: Secondary | ICD-10-CM | POA: Diagnosis not present

## 2014-12-04 DIAGNOSIS — Z720 Tobacco use: Secondary | ICD-10-CM | POA: Diagnosis not present

## 2014-12-04 DIAGNOSIS — G2581 Restless legs syndrome: Secondary | ICD-10-CM | POA: Diagnosis not present

## 2014-12-04 DIAGNOSIS — J310 Chronic rhinitis: Secondary | ICD-10-CM | POA: Diagnosis not present

## 2014-12-04 DIAGNOSIS — J4530 Mild persistent asthma, uncomplicated: Secondary | ICD-10-CM | POA: Diagnosis not present

## 2014-12-11 DIAGNOSIS — E785 Hyperlipidemia, unspecified: Secondary | ICD-10-CM | POA: Diagnosis not present

## 2014-12-11 DIAGNOSIS — R079 Chest pain, unspecified: Secondary | ICD-10-CM | POA: Diagnosis not present

## 2014-12-11 DIAGNOSIS — R42 Dizziness and giddiness: Secondary | ICD-10-CM | POA: Diagnosis not present

## 2014-12-20 DIAGNOSIS — J45909 Unspecified asthma, uncomplicated: Secondary | ICD-10-CM | POA: Diagnosis not present

## 2014-12-20 DIAGNOSIS — E780 Pure hypercholesterolemia: Secondary | ICD-10-CM | POA: Diagnosis not present

## 2014-12-20 DIAGNOSIS — Z7982 Long term (current) use of aspirin: Secondary | ICD-10-CM | POA: Diagnosis not present

## 2014-12-20 DIAGNOSIS — M5430 Sciatica, unspecified side: Secondary | ICD-10-CM | POA: Diagnosis not present

## 2014-12-20 DIAGNOSIS — J449 Chronic obstructive pulmonary disease, unspecified: Secondary | ICD-10-CM | POA: Diagnosis not present

## 2014-12-20 DIAGNOSIS — I10 Essential (primary) hypertension: Secondary | ICD-10-CM | POA: Diagnosis not present

## 2014-12-20 DIAGNOSIS — F17210 Nicotine dependence, cigarettes, uncomplicated: Secondary | ICD-10-CM | POA: Diagnosis not present

## 2014-12-31 DIAGNOSIS — S7001XA Contusion of right hip, initial encounter: Secondary | ICD-10-CM | POA: Diagnosis not present

## 2014-12-31 DIAGNOSIS — S300XXA Contusion of lower back and pelvis, initial encounter: Secondary | ICD-10-CM | POA: Diagnosis not present

## 2015-01-03 DIAGNOSIS — M5416 Radiculopathy, lumbar region: Secondary | ICD-10-CM | POA: Diagnosis not present

## 2015-01-03 DIAGNOSIS — R600 Localized edema: Secondary | ICD-10-CM | POA: Diagnosis not present

## 2015-01-13 DIAGNOSIS — M5126 Other intervertebral disc displacement, lumbar region: Secondary | ICD-10-CM | POA: Diagnosis not present

## 2015-01-13 DIAGNOSIS — Z981 Arthrodesis status: Secondary | ICD-10-CM | POA: Diagnosis not present

## 2015-01-13 DIAGNOSIS — M47896 Other spondylosis, lumbar region: Secondary | ICD-10-CM | POA: Diagnosis not present

## 2015-01-13 DIAGNOSIS — M5125 Other intervertebral disc displacement, thoracolumbar region: Secondary | ICD-10-CM | POA: Diagnosis not present

## 2015-01-13 DIAGNOSIS — M5416 Radiculopathy, lumbar region: Secondary | ICD-10-CM | POA: Diagnosis not present

## 2015-01-21 DIAGNOSIS — M4716 Other spondylosis with myelopathy, lumbar region: Secondary | ICD-10-CM | POA: Diagnosis not present

## 2015-01-21 DIAGNOSIS — M4806 Spinal stenosis, lumbar region: Secondary | ICD-10-CM | POA: Diagnosis not present

## 2015-01-21 DIAGNOSIS — Z79899 Other long term (current) drug therapy: Secondary | ICD-10-CM | POA: Diagnosis not present

## 2015-01-23 DIAGNOSIS — M961 Postlaminectomy syndrome, not elsewhere classified: Secondary | ICD-10-CM | POA: Diagnosis not present

## 2015-01-23 DIAGNOSIS — G894 Chronic pain syndrome: Secondary | ICD-10-CM | POA: Diagnosis not present

## 2015-01-23 DIAGNOSIS — Z79891 Long term (current) use of opiate analgesic: Secondary | ICD-10-CM | POA: Diagnosis not present

## 2015-01-23 DIAGNOSIS — M545 Low back pain: Secondary | ICD-10-CM | POA: Diagnosis not present

## 2015-01-29 DIAGNOSIS — M545 Low back pain: Secondary | ICD-10-CM | POA: Diagnosis not present

## 2015-02-03 DIAGNOSIS — R05 Cough: Secondary | ICD-10-CM | POA: Diagnosis not present

## 2015-02-03 DIAGNOSIS — J4530 Mild persistent asthma, uncomplicated: Secondary | ICD-10-CM | POA: Diagnosis not present

## 2015-02-03 DIAGNOSIS — E559 Vitamin D deficiency, unspecified: Secondary | ICD-10-CM | POA: Diagnosis not present

## 2015-02-05 DIAGNOSIS — G2581 Restless legs syndrome: Secondary | ICD-10-CM | POA: Diagnosis not present

## 2015-02-05 DIAGNOSIS — J4530 Mild persistent asthma, uncomplicated: Secondary | ICD-10-CM | POA: Diagnosis not present

## 2015-02-05 DIAGNOSIS — J310 Chronic rhinitis: Secondary | ICD-10-CM | POA: Diagnosis not present

## 2015-02-05 DIAGNOSIS — F17210 Nicotine dependence, cigarettes, uncomplicated: Secondary | ICD-10-CM | POA: Diagnosis not present

## 2015-02-12 DIAGNOSIS — M961 Postlaminectomy syndrome, not elsewhere classified: Secondary | ICD-10-CM | POA: Diagnosis not present

## 2015-02-12 DIAGNOSIS — M545 Low back pain: Secondary | ICD-10-CM | POA: Diagnosis not present

## 2015-02-14 DIAGNOSIS — S32059K Unspecified fracture of fifth lumbar vertebra, subsequent encounter for fracture with nonunion: Secondary | ICD-10-CM | POA: Diagnosis not present

## 2015-02-14 DIAGNOSIS — Z981 Arthrodesis status: Secondary | ICD-10-CM | POA: Diagnosis not present

## 2015-02-14 DIAGNOSIS — S32049K Unspecified fracture of fourth lumbar vertebra, subsequent encounter for fracture with nonunion: Secondary | ICD-10-CM | POA: Diagnosis not present

## 2015-02-14 DIAGNOSIS — M47816 Spondylosis without myelopathy or radiculopathy, lumbar region: Secondary | ICD-10-CM | POA: Diagnosis not present

## 2015-02-14 DIAGNOSIS — M5126 Other intervertebral disc displacement, lumbar region: Secondary | ICD-10-CM | POA: Diagnosis not present

## 2015-02-14 DIAGNOSIS — M545 Low back pain: Secondary | ICD-10-CM | POA: Diagnosis not present

## 2015-02-24 DIAGNOSIS — R5383 Other fatigue: Secondary | ICD-10-CM | POA: Diagnosis not present

## 2015-02-24 DIAGNOSIS — J4530 Mild persistent asthma, uncomplicated: Secondary | ICD-10-CM | POA: Diagnosis not present

## 2015-02-27 DIAGNOSIS — M545 Low back pain: Secondary | ICD-10-CM | POA: Diagnosis not present

## 2015-02-27 DIAGNOSIS — M4806 Spinal stenosis, lumbar region: Secondary | ICD-10-CM | POA: Diagnosis not present

## 2015-02-27 DIAGNOSIS — M4716 Other spondylosis with myelopathy, lumbar region: Secondary | ICD-10-CM | POA: Diagnosis not present

## 2015-02-27 DIAGNOSIS — M960 Pseudarthrosis after fusion or arthrodesis: Secondary | ICD-10-CM | POA: Diagnosis not present

## 2015-03-06 DIAGNOSIS — E7800 Pure hypercholesterolemia, unspecified: Secondary | ICD-10-CM | POA: Insufficient documentation

## 2015-03-06 DIAGNOSIS — G4733 Obstructive sleep apnea (adult) (pediatric): Secondary | ICD-10-CM | POA: Insufficient documentation

## 2015-03-06 DIAGNOSIS — F17200 Nicotine dependence, unspecified, uncomplicated: Secondary | ICD-10-CM | POA: Insufficient documentation

## 2015-03-06 DIAGNOSIS — Z720 Tobacco use: Secondary | ICD-10-CM

## 2015-03-06 DIAGNOSIS — R0789 Other chest pain: Secondary | ICD-10-CM | POA: Diagnosis not present

## 2015-03-06 HISTORY — DX: Tobacco use: Z72.0

## 2015-03-06 HISTORY — DX: Obstructive sleep apnea (adult) (pediatric): G47.33

## 2015-03-06 HISTORY — DX: Pure hypercholesterolemia, unspecified: E78.00

## 2015-03-07 DIAGNOSIS — J4530 Mild persistent asthma, uncomplicated: Secondary | ICD-10-CM | POA: Diagnosis not present

## 2015-03-28 DIAGNOSIS — M4726 Other spondylosis with radiculopathy, lumbar region: Secondary | ICD-10-CM | POA: Diagnosis not present

## 2015-03-28 DIAGNOSIS — M961 Postlaminectomy syndrome, not elsewhere classified: Secondary | ICD-10-CM | POA: Diagnosis not present

## 2015-03-28 DIAGNOSIS — J439 Emphysema, unspecified: Secondary | ICD-10-CM | POA: Diagnosis present

## 2015-03-28 DIAGNOSIS — I10 Essential (primary) hypertension: Secondary | ICD-10-CM | POA: Diagnosis present

## 2015-03-28 DIAGNOSIS — Z9981 Dependence on supplemental oxygen: Secondary | ICD-10-CM | POA: Diagnosis not present

## 2015-03-28 DIAGNOSIS — M4806 Spinal stenosis, lumbar region: Secondary | ICD-10-CM | POA: Diagnosis not present

## 2015-03-28 DIAGNOSIS — R54 Age-related physical debility: Secondary | ICD-10-CM | POA: Diagnosis not present

## 2015-03-28 DIAGNOSIS — G4733 Obstructive sleep apnea (adult) (pediatric): Secondary | ICD-10-CM | POA: Diagnosis not present

## 2015-03-28 DIAGNOSIS — J45909 Unspecified asthma, uncomplicated: Secondary | ICD-10-CM | POA: Diagnosis present

## 2015-03-28 DIAGNOSIS — J449 Chronic obstructive pulmonary disease, unspecified: Secondary | ICD-10-CM | POA: Diagnosis present

## 2015-03-28 DIAGNOSIS — M4716 Other spondylosis with myelopathy, lumbar region: Secondary | ICD-10-CM | POA: Insufficient documentation

## 2015-03-28 DIAGNOSIS — M4800 Spinal stenosis, site unspecified: Secondary | ICD-10-CM

## 2015-03-28 DIAGNOSIS — E785 Hyperlipidemia, unspecified: Secondary | ICD-10-CM | POA: Diagnosis present

## 2015-03-28 DIAGNOSIS — T84296A Other mechanical complication of internal fixation device of vertebrae, initial encounter: Secondary | ICD-10-CM | POA: Diagnosis not present

## 2015-03-28 DIAGNOSIS — Z4889 Encounter for other specified surgical aftercare: Secondary | ICD-10-CM | POA: Diagnosis not present

## 2015-03-28 DIAGNOSIS — F17210 Nicotine dependence, cigarettes, uncomplicated: Secondary | ICD-10-CM | POA: Diagnosis present

## 2015-03-28 DIAGNOSIS — G4700 Insomnia, unspecified: Secondary | ICD-10-CM | POA: Diagnosis present

## 2015-03-28 DIAGNOSIS — Z981 Arthrodesis status: Secondary | ICD-10-CM | POA: Diagnosis not present

## 2015-03-28 DIAGNOSIS — M960 Pseudarthrosis after fusion or arthrodesis: Secondary | ICD-10-CM | POA: Diagnosis not present

## 2015-03-28 HISTORY — DX: Spinal stenosis, site unspecified: M48.00

## 2015-03-28 HISTORY — DX: Other spondylosis with myelopathy, lumbar region: M47.16

## 2015-04-03 DIAGNOSIS — I10 Essential (primary) hypertension: Secondary | ICD-10-CM | POA: Diagnosis present

## 2015-04-03 DIAGNOSIS — J45909 Unspecified asthma, uncomplicated: Secondary | ICD-10-CM | POA: Diagnosis present

## 2015-04-03 DIAGNOSIS — J449 Chronic obstructive pulmonary disease, unspecified: Secondary | ICD-10-CM | POA: Diagnosis present

## 2015-04-03 DIAGNOSIS — Z9981 Dependence on supplemental oxygen: Secondary | ICD-10-CM | POA: Diagnosis not present

## 2015-04-03 DIAGNOSIS — M4806 Spinal stenosis, lumbar region: Secondary | ICD-10-CM | POA: Diagnosis present

## 2015-04-03 DIAGNOSIS — G4733 Obstructive sleep apnea (adult) (pediatric): Secondary | ICD-10-CM | POA: Diagnosis present

## 2015-04-03 DIAGNOSIS — F17210 Nicotine dependence, cigarettes, uncomplicated: Secondary | ICD-10-CM | POA: Diagnosis present

## 2015-04-03 DIAGNOSIS — M4716 Other spondylosis with myelopathy, lumbar region: Secondary | ICD-10-CM | POA: Diagnosis present

## 2015-04-03 DIAGNOSIS — M961 Postlaminectomy syndrome, not elsewhere classified: Secondary | ICD-10-CM | POA: Diagnosis present

## 2015-04-03 DIAGNOSIS — R54 Age-related physical debility: Secondary | ICD-10-CM | POA: Diagnosis not present

## 2015-04-03 DIAGNOSIS — E785 Hyperlipidemia, unspecified: Secondary | ICD-10-CM | POA: Diagnosis present

## 2015-04-03 DIAGNOSIS — Z981 Arthrodesis status: Secondary | ICD-10-CM | POA: Diagnosis not present

## 2015-04-03 DIAGNOSIS — J439 Emphysema, unspecified: Secondary | ICD-10-CM | POA: Diagnosis present

## 2015-04-14 DIAGNOSIS — M4806 Spinal stenosis, lumbar region: Secondary | ICD-10-CM | POA: Diagnosis not present

## 2015-04-18 DIAGNOSIS — R2689 Other abnormalities of gait and mobility: Secondary | ICD-10-CM | POA: Diagnosis not present

## 2015-04-18 DIAGNOSIS — M4806 Spinal stenosis, lumbar region: Secondary | ICD-10-CM | POA: Diagnosis not present

## 2015-04-18 DIAGNOSIS — M6281 Muscle weakness (generalized): Secondary | ICD-10-CM | POA: Diagnosis not present

## 2015-04-18 DIAGNOSIS — M545 Low back pain: Secondary | ICD-10-CM | POA: Diagnosis not present

## 2015-04-23 DIAGNOSIS — M545 Low back pain: Secondary | ICD-10-CM | POA: Diagnosis not present

## 2015-04-23 DIAGNOSIS — M4806 Spinal stenosis, lumbar region: Secondary | ICD-10-CM | POA: Diagnosis not present

## 2015-04-23 DIAGNOSIS — M6281 Muscle weakness (generalized): Secondary | ICD-10-CM | POA: Diagnosis not present

## 2015-04-23 DIAGNOSIS — R2689 Other abnormalities of gait and mobility: Secondary | ICD-10-CM | POA: Diagnosis not present

## 2015-04-28 DIAGNOSIS — M6281 Muscle weakness (generalized): Secondary | ICD-10-CM | POA: Diagnosis not present

## 2015-04-28 DIAGNOSIS — M545 Low back pain: Secondary | ICD-10-CM | POA: Diagnosis not present

## 2015-04-28 DIAGNOSIS — R2689 Other abnormalities of gait and mobility: Secondary | ICD-10-CM | POA: Diagnosis not present

## 2015-04-28 DIAGNOSIS — M4806 Spinal stenosis, lumbar region: Secondary | ICD-10-CM | POA: Diagnosis not present

## 2015-04-30 DIAGNOSIS — M4806 Spinal stenosis, lumbar region: Secondary | ICD-10-CM | POA: Diagnosis not present

## 2015-04-30 DIAGNOSIS — R2689 Other abnormalities of gait and mobility: Secondary | ICD-10-CM | POA: Diagnosis not present

## 2015-04-30 DIAGNOSIS — M545 Low back pain: Secondary | ICD-10-CM | POA: Diagnosis not present

## 2015-04-30 DIAGNOSIS — M6281 Muscle weakness (generalized): Secondary | ICD-10-CM | POA: Diagnosis not present

## 2015-05-01 DIAGNOSIS — M961 Postlaminectomy syndrome, not elsewhere classified: Secondary | ICD-10-CM | POA: Diagnosis not present

## 2015-05-24 DIAGNOSIS — S098XXA Other specified injuries of head, initial encounter: Secondary | ICD-10-CM | POA: Diagnosis not present

## 2015-05-24 DIAGNOSIS — S0990XA Unspecified injury of head, initial encounter: Secondary | ICD-10-CM | POA: Diagnosis not present

## 2015-05-24 DIAGNOSIS — M545 Low back pain: Secondary | ICD-10-CM | POA: Diagnosis not present

## 2015-05-24 DIAGNOSIS — J329 Chronic sinusitis, unspecified: Secondary | ICD-10-CM | POA: Diagnosis not present

## 2015-05-24 DIAGNOSIS — M25562 Pain in left knee: Secondary | ICD-10-CM | POA: Diagnosis not present

## 2015-05-24 DIAGNOSIS — S139XXA Sprain of joints and ligaments of unspecified parts of neck, initial encounter: Secondary | ICD-10-CM | POA: Diagnosis not present

## 2015-05-24 DIAGNOSIS — S39012A Strain of muscle, fascia and tendon of lower back, initial encounter: Secondary | ICD-10-CM | POA: Diagnosis not present

## 2015-05-24 DIAGNOSIS — R51 Headache: Secondary | ICD-10-CM | POA: Diagnosis not present

## 2015-05-24 DIAGNOSIS — S8002XA Contusion of left knee, initial encounter: Secondary | ICD-10-CM | POA: Diagnosis not present

## 2015-05-26 DIAGNOSIS — M549 Dorsalgia, unspecified: Secondary | ICD-10-CM | POA: Diagnosis not present

## 2015-06-02 DIAGNOSIS — F17210 Nicotine dependence, cigarettes, uncomplicated: Secondary | ICD-10-CM | POA: Diagnosis not present

## 2015-06-02 DIAGNOSIS — Z23 Encounter for immunization: Secondary | ICD-10-CM | POA: Diagnosis not present

## 2015-06-02 DIAGNOSIS — G2581 Restless legs syndrome: Secondary | ICD-10-CM | POA: Diagnosis not present

## 2015-06-02 DIAGNOSIS — J310 Chronic rhinitis: Secondary | ICD-10-CM | POA: Diagnosis not present

## 2015-06-02 DIAGNOSIS — J4530 Mild persistent asthma, uncomplicated: Secondary | ICD-10-CM | POA: Diagnosis not present

## 2015-06-03 DIAGNOSIS — F0781 Postconcussional syndrome: Secondary | ICD-10-CM | POA: Diagnosis not present

## 2015-06-03 DIAGNOSIS — M25562 Pain in left knee: Secondary | ICD-10-CM | POA: Diagnosis not present

## 2015-06-03 DIAGNOSIS — J3489 Other specified disorders of nose and nasal sinuses: Secondary | ICD-10-CM | POA: Diagnosis not present

## 2015-07-04 DIAGNOSIS — M2560 Stiffness of unspecified joint, not elsewhere classified: Secondary | ICD-10-CM | POA: Diagnosis not present

## 2015-07-04 DIAGNOSIS — M6281 Muscle weakness (generalized): Secondary | ICD-10-CM | POA: Diagnosis not present

## 2015-07-04 DIAGNOSIS — M545 Low back pain: Secondary | ICD-10-CM | POA: Diagnosis not present

## 2015-07-04 DIAGNOSIS — M4716 Other spondylosis with myelopathy, lumbar region: Secondary | ICD-10-CM | POA: Diagnosis not present

## 2015-07-04 DIAGNOSIS — R293 Abnormal posture: Secondary | ICD-10-CM | POA: Diagnosis not present

## 2015-07-04 DIAGNOSIS — R201 Hypoesthesia of skin: Secondary | ICD-10-CM | POA: Diagnosis not present

## 2015-07-04 DIAGNOSIS — R262 Difficulty in walking, not elsewhere classified: Secondary | ICD-10-CM | POA: Diagnosis not present

## 2015-07-04 DIAGNOSIS — M79605 Pain in left leg: Secondary | ICD-10-CM | POA: Diagnosis not present

## 2015-07-11 DIAGNOSIS — M79605 Pain in left leg: Secondary | ICD-10-CM | POA: Diagnosis not present

## 2015-07-11 DIAGNOSIS — M4716 Other spondylosis with myelopathy, lumbar region: Secondary | ICD-10-CM | POA: Diagnosis not present

## 2015-07-11 DIAGNOSIS — R201 Hypoesthesia of skin: Secondary | ICD-10-CM | POA: Diagnosis not present

## 2015-07-11 DIAGNOSIS — M545 Low back pain: Secondary | ICD-10-CM | POA: Diagnosis not present

## 2015-07-11 DIAGNOSIS — R293 Abnormal posture: Secondary | ICD-10-CM | POA: Diagnosis not present

## 2015-07-11 DIAGNOSIS — M6281 Muscle weakness (generalized): Secondary | ICD-10-CM | POA: Diagnosis not present

## 2015-07-14 DIAGNOSIS — M545 Low back pain: Secondary | ICD-10-CM | POA: Diagnosis not present

## 2015-07-14 DIAGNOSIS — M4716 Other spondylosis with myelopathy, lumbar region: Secondary | ICD-10-CM | POA: Diagnosis not present

## 2015-07-14 DIAGNOSIS — M6281 Muscle weakness (generalized): Secondary | ICD-10-CM | POA: Diagnosis not present

## 2015-07-14 DIAGNOSIS — M79605 Pain in left leg: Secondary | ICD-10-CM | POA: Diagnosis not present

## 2015-07-14 DIAGNOSIS — R201 Hypoesthesia of skin: Secondary | ICD-10-CM | POA: Diagnosis not present

## 2015-07-14 DIAGNOSIS — R293 Abnormal posture: Secondary | ICD-10-CM | POA: Diagnosis not present

## 2015-07-16 DIAGNOSIS — M545 Low back pain: Secondary | ICD-10-CM | POA: Diagnosis not present

## 2015-07-16 DIAGNOSIS — R293 Abnormal posture: Secondary | ICD-10-CM | POA: Diagnosis not present

## 2015-07-16 DIAGNOSIS — R201 Hypoesthesia of skin: Secondary | ICD-10-CM | POA: Diagnosis not present

## 2015-07-16 DIAGNOSIS — M79605 Pain in left leg: Secondary | ICD-10-CM | POA: Diagnosis not present

## 2015-07-16 DIAGNOSIS — M6281 Muscle weakness (generalized): Secondary | ICD-10-CM | POA: Diagnosis not present

## 2015-07-16 DIAGNOSIS — M4716 Other spondylosis with myelopathy, lumbar region: Secondary | ICD-10-CM | POA: Diagnosis not present

## 2015-07-20 DIAGNOSIS — J449 Chronic obstructive pulmonary disease, unspecified: Secondary | ICD-10-CM | POA: Diagnosis not present

## 2015-07-20 DIAGNOSIS — M5432 Sciatica, left side: Secondary | ICD-10-CM | POA: Diagnosis not present

## 2015-07-20 DIAGNOSIS — Z7982 Long term (current) use of aspirin: Secondary | ICD-10-CM | POA: Diagnosis not present

## 2015-07-20 DIAGNOSIS — M545 Low back pain: Secondary | ICD-10-CM | POA: Diagnosis not present

## 2015-07-20 DIAGNOSIS — E785 Hyperlipidemia, unspecified: Secondary | ICD-10-CM | POA: Diagnosis not present

## 2015-07-20 DIAGNOSIS — I10 Essential (primary) hypertension: Secondary | ICD-10-CM | POA: Diagnosis not present

## 2015-07-20 DIAGNOSIS — J45909 Unspecified asthma, uncomplicated: Secondary | ICD-10-CM | POA: Diagnosis not present

## 2015-07-20 DIAGNOSIS — F17210 Nicotine dependence, cigarettes, uncomplicated: Secondary | ICD-10-CM | POA: Diagnosis not present

## 2015-07-20 DIAGNOSIS — R52 Pain, unspecified: Secondary | ICD-10-CM | POA: Diagnosis not present

## 2015-07-20 DIAGNOSIS — M5442 Lumbago with sciatica, left side: Secondary | ICD-10-CM | POA: Diagnosis not present

## 2015-07-20 DIAGNOSIS — Z79899 Other long term (current) drug therapy: Secondary | ICD-10-CM | POA: Diagnosis not present

## 2015-07-21 DIAGNOSIS — M4806 Spinal stenosis, lumbar region: Secondary | ICD-10-CM | POA: Diagnosis not present

## 2015-07-21 DIAGNOSIS — M961 Postlaminectomy syndrome, not elsewhere classified: Secondary | ICD-10-CM | POA: Diagnosis not present

## 2015-07-21 DIAGNOSIS — M545 Low back pain: Secondary | ICD-10-CM | POA: Diagnosis not present

## 2015-07-21 DIAGNOSIS — M4716 Other spondylosis with myelopathy, lumbar region: Secondary | ICD-10-CM | POA: Diagnosis not present

## 2015-07-28 DIAGNOSIS — M4806 Spinal stenosis, lumbar region: Secondary | ICD-10-CM | POA: Diagnosis not present

## 2015-07-28 DIAGNOSIS — M5416 Radiculopathy, lumbar region: Secondary | ICD-10-CM | POA: Diagnosis not present

## 2015-07-28 DIAGNOSIS — M5442 Lumbago with sciatica, left side: Secondary | ICD-10-CM | POA: Diagnosis not present

## 2015-07-31 DIAGNOSIS — R079 Chest pain, unspecified: Secondary | ICD-10-CM | POA: Diagnosis not present

## 2015-07-31 DIAGNOSIS — R0600 Dyspnea, unspecified: Secondary | ICD-10-CM | POA: Diagnosis not present

## 2015-07-31 DIAGNOSIS — E7800 Pure hypercholesterolemia, unspecified: Secondary | ICD-10-CM | POA: Diagnosis not present

## 2015-07-31 DIAGNOSIS — K219 Gastro-esophageal reflux disease without esophagitis: Secondary | ICD-10-CM | POA: Diagnosis not present

## 2015-07-31 DIAGNOSIS — Z7982 Long term (current) use of aspirin: Secondary | ICD-10-CM | POA: Diagnosis not present

## 2015-07-31 DIAGNOSIS — R001 Bradycardia, unspecified: Secondary | ICD-10-CM

## 2015-07-31 DIAGNOSIS — M4716 Other spondylosis with myelopathy, lumbar region: Secondary | ICD-10-CM | POA: Diagnosis not present

## 2015-07-31 DIAGNOSIS — Z79899 Other long term (current) drug therapy: Secondary | ICD-10-CM | POA: Diagnosis not present

## 2015-07-31 DIAGNOSIS — F17210 Nicotine dependence, cigarettes, uncomplicated: Secondary | ICD-10-CM | POA: Diagnosis not present

## 2015-07-31 DIAGNOSIS — G4733 Obstructive sleep apnea (adult) (pediatric): Secondary | ICD-10-CM | POA: Diagnosis not present

## 2015-07-31 DIAGNOSIS — M4806 Spinal stenosis, lumbar region: Secondary | ICD-10-CM | POA: Diagnosis not present

## 2015-07-31 DIAGNOSIS — R0789 Other chest pain: Secondary | ICD-10-CM | POA: Diagnosis not present

## 2015-07-31 DIAGNOSIS — E785 Hyperlipidemia, unspecified: Secondary | ICD-10-CM | POA: Diagnosis not present

## 2015-07-31 DIAGNOSIS — I10 Essential (primary) hypertension: Secondary | ICD-10-CM | POA: Diagnosis not present

## 2015-07-31 DIAGNOSIS — R0602 Shortness of breath: Secondary | ICD-10-CM | POA: Diagnosis not present

## 2015-07-31 DIAGNOSIS — J449 Chronic obstructive pulmonary disease, unspecified: Secondary | ICD-10-CM | POA: Diagnosis not present

## 2015-07-31 DIAGNOSIS — R072 Precordial pain: Secondary | ICD-10-CM | POA: Diagnosis not present

## 2015-07-31 HISTORY — DX: Bradycardia, unspecified: R00.1

## 2015-08-01 DIAGNOSIS — R0789 Other chest pain: Secondary | ICD-10-CM | POA: Diagnosis not present

## 2015-08-01 DIAGNOSIS — R079 Chest pain, unspecified: Secondary | ICD-10-CM | POA: Diagnosis not present

## 2015-08-01 DIAGNOSIS — R9431 Abnormal electrocardiogram [ECG] [EKG]: Secondary | ICD-10-CM | POA: Diagnosis not present

## 2015-08-05 DIAGNOSIS — M5124 Other intervertebral disc displacement, thoracic region: Secondary | ICD-10-CM | POA: Diagnosis not present

## 2015-08-05 DIAGNOSIS — M961 Postlaminectomy syndrome, not elsewhere classified: Secondary | ICD-10-CM | POA: Diagnosis not present

## 2015-08-05 DIAGNOSIS — M4716 Other spondylosis with myelopathy, lumbar region: Secondary | ICD-10-CM | POA: Diagnosis not present

## 2015-08-05 DIAGNOSIS — M47894 Other spondylosis, thoracic region: Secondary | ICD-10-CM | POA: Diagnosis not present

## 2015-08-06 DIAGNOSIS — M545 Low back pain: Secondary | ICD-10-CM | POA: Diagnosis not present

## 2015-08-06 DIAGNOSIS — M4326 Fusion of spine, lumbar region: Secondary | ICD-10-CM | POA: Diagnosis not present

## 2015-08-06 DIAGNOSIS — M961 Postlaminectomy syndrome, not elsewhere classified: Secondary | ICD-10-CM | POA: Diagnosis not present

## 2015-08-14 DIAGNOSIS — J4530 Mild persistent asthma, uncomplicated: Secondary | ICD-10-CM | POA: Diagnosis not present

## 2015-08-14 DIAGNOSIS — F17210 Nicotine dependence, cigarettes, uncomplicated: Secondary | ICD-10-CM | POA: Diagnosis not present

## 2015-08-14 DIAGNOSIS — G4733 Obstructive sleep apnea (adult) (pediatric): Secondary | ICD-10-CM | POA: Diagnosis not present

## 2015-08-14 DIAGNOSIS — J310 Chronic rhinitis: Secondary | ICD-10-CM | POA: Diagnosis not present

## 2015-08-14 DIAGNOSIS — G2581 Restless legs syndrome: Secondary | ICD-10-CM | POA: Diagnosis not present

## 2015-08-18 DIAGNOSIS — Z720 Tobacco use: Secondary | ICD-10-CM | POA: Diagnosis not present

## 2015-08-28 DIAGNOSIS — J310 Chronic rhinitis: Secondary | ICD-10-CM | POA: Diagnosis not present

## 2015-09-22 DIAGNOSIS — M4326 Fusion of spine, lumbar region: Secondary | ICD-10-CM | POA: Diagnosis not present

## 2015-09-22 DIAGNOSIS — J4521 Mild intermittent asthma with (acute) exacerbation: Secondary | ICD-10-CM | POA: Diagnosis not present

## 2015-09-22 DIAGNOSIS — J209 Acute bronchitis, unspecified: Secondary | ICD-10-CM | POA: Diagnosis not present

## 2015-09-22 DIAGNOSIS — R509 Fever, unspecified: Secondary | ICD-10-CM | POA: Diagnosis not present

## 2015-09-22 DIAGNOSIS — M545 Low back pain: Secondary | ICD-10-CM | POA: Diagnosis not present

## 2015-09-22 DIAGNOSIS — M961 Postlaminectomy syndrome, not elsewhere classified: Secondary | ICD-10-CM | POA: Diagnosis not present

## 2015-09-30 DIAGNOSIS — M79605 Pain in left leg: Secondary | ICD-10-CM | POA: Diagnosis not present

## 2015-09-30 DIAGNOSIS — M6281 Muscle weakness (generalized): Secondary | ICD-10-CM | POA: Diagnosis not present

## 2015-09-30 DIAGNOSIS — R201 Hypoesthesia of skin: Secondary | ICD-10-CM | POA: Diagnosis not present

## 2015-09-30 DIAGNOSIS — M4326 Fusion of spine, lumbar region: Secondary | ICD-10-CM | POA: Diagnosis not present

## 2015-09-30 DIAGNOSIS — Z79899 Other long term (current) drug therapy: Secondary | ICD-10-CM | POA: Diagnosis not present

## 2015-09-30 DIAGNOSIS — E785 Hyperlipidemia, unspecified: Secondary | ICD-10-CM | POA: Diagnosis not present

## 2015-09-30 DIAGNOSIS — M545 Low back pain: Secondary | ICD-10-CM | POA: Diagnosis not present

## 2015-09-30 DIAGNOSIS — R262 Difficulty in walking, not elsewhere classified: Secondary | ICD-10-CM | POA: Diagnosis not present

## 2015-09-30 DIAGNOSIS — R293 Abnormal posture: Secondary | ICD-10-CM | POA: Diagnosis not present

## 2015-09-30 DIAGNOSIS — M4716 Other spondylosis with myelopathy, lumbar region: Secondary | ICD-10-CM | POA: Diagnosis not present

## 2015-09-30 DIAGNOSIS — M2560 Stiffness of unspecified joint, not elsewhere classified: Secondary | ICD-10-CM | POA: Diagnosis not present

## 2015-10-02 DIAGNOSIS — M2560 Stiffness of unspecified joint, not elsewhere classified: Secondary | ICD-10-CM | POA: Diagnosis not present

## 2015-10-02 DIAGNOSIS — R293 Abnormal posture: Secondary | ICD-10-CM | POA: Diagnosis not present

## 2015-10-02 DIAGNOSIS — M4716 Other spondylosis with myelopathy, lumbar region: Secondary | ICD-10-CM | POA: Diagnosis not present

## 2015-10-02 DIAGNOSIS — M4326 Fusion of spine, lumbar region: Secondary | ICD-10-CM | POA: Diagnosis not present

## 2015-10-02 DIAGNOSIS — R201 Hypoesthesia of skin: Secondary | ICD-10-CM | POA: Diagnosis not present

## 2015-10-02 DIAGNOSIS — R262 Difficulty in walking, not elsewhere classified: Secondary | ICD-10-CM | POA: Diagnosis not present

## 2015-10-02 DIAGNOSIS — M6281 Muscle weakness (generalized): Secondary | ICD-10-CM | POA: Diagnosis not present

## 2015-10-02 DIAGNOSIS — M79605 Pain in left leg: Secondary | ICD-10-CM | POA: Diagnosis not present

## 2015-10-02 DIAGNOSIS — M545 Low back pain: Secondary | ICD-10-CM | POA: Diagnosis not present

## 2015-10-07 DIAGNOSIS — M6281 Muscle weakness (generalized): Secondary | ICD-10-CM | POA: Diagnosis not present

## 2015-10-07 DIAGNOSIS — M79605 Pain in left leg: Secondary | ICD-10-CM | POA: Diagnosis not present

## 2015-10-07 DIAGNOSIS — M545 Low back pain: Secondary | ICD-10-CM | POA: Diagnosis not present

## 2015-10-07 DIAGNOSIS — R201 Hypoesthesia of skin: Secondary | ICD-10-CM | POA: Diagnosis not present

## 2015-10-07 DIAGNOSIS — M4326 Fusion of spine, lumbar region: Secondary | ICD-10-CM | POA: Diagnosis not present

## 2015-10-07 DIAGNOSIS — M4716 Other spondylosis with myelopathy, lumbar region: Secondary | ICD-10-CM | POA: Diagnosis not present

## 2015-10-09 DIAGNOSIS — M545 Low back pain: Secondary | ICD-10-CM | POA: Diagnosis not present

## 2015-10-09 DIAGNOSIS — R201 Hypoesthesia of skin: Secondary | ICD-10-CM | POA: Diagnosis not present

## 2015-10-09 DIAGNOSIS — M4716 Other spondylosis with myelopathy, lumbar region: Secondary | ICD-10-CM | POA: Diagnosis not present

## 2015-10-09 DIAGNOSIS — M79605 Pain in left leg: Secondary | ICD-10-CM | POA: Diagnosis not present

## 2015-10-09 DIAGNOSIS — M4326 Fusion of spine, lumbar region: Secondary | ICD-10-CM | POA: Diagnosis not present

## 2015-10-09 DIAGNOSIS — M6281 Muscle weakness (generalized): Secondary | ICD-10-CM | POA: Diagnosis not present

## 2015-10-13 DIAGNOSIS — M79605 Pain in left leg: Secondary | ICD-10-CM | POA: Diagnosis not present

## 2015-10-13 DIAGNOSIS — M545 Low back pain: Secondary | ICD-10-CM | POA: Diagnosis not present

## 2015-10-13 DIAGNOSIS — M4716 Other spondylosis with myelopathy, lumbar region: Secondary | ICD-10-CM | POA: Diagnosis not present

## 2015-10-13 DIAGNOSIS — M4326 Fusion of spine, lumbar region: Secondary | ICD-10-CM | POA: Diagnosis not present

## 2015-10-13 DIAGNOSIS — M6281 Muscle weakness (generalized): Secondary | ICD-10-CM | POA: Diagnosis not present

## 2015-10-13 DIAGNOSIS — R201 Hypoesthesia of skin: Secondary | ICD-10-CM | POA: Diagnosis not present

## 2015-10-14 DIAGNOSIS — S93602A Unspecified sprain of left foot, initial encounter: Secondary | ICD-10-CM | POA: Diagnosis not present

## 2015-10-14 DIAGNOSIS — M7989 Other specified soft tissue disorders: Secondary | ICD-10-CM | POA: Diagnosis not present

## 2015-10-17 DIAGNOSIS — M79605 Pain in left leg: Secondary | ICD-10-CM | POA: Diagnosis not present

## 2015-10-17 DIAGNOSIS — R201 Hypoesthesia of skin: Secondary | ICD-10-CM | POA: Diagnosis not present

## 2015-10-17 DIAGNOSIS — M6281 Muscle weakness (generalized): Secondary | ICD-10-CM | POA: Diagnosis not present

## 2015-10-17 DIAGNOSIS — M545 Low back pain: Secondary | ICD-10-CM | POA: Diagnosis not present

## 2015-10-17 DIAGNOSIS — M4326 Fusion of spine, lumbar region: Secondary | ICD-10-CM | POA: Diagnosis not present

## 2015-10-17 DIAGNOSIS — M4716 Other spondylosis with myelopathy, lumbar region: Secondary | ICD-10-CM | POA: Diagnosis not present

## 2015-10-20 DIAGNOSIS — Z0000 Encounter for general adult medical examination without abnormal findings: Secondary | ICD-10-CM | POA: Diagnosis not present

## 2015-10-20 DIAGNOSIS — M79672 Pain in left foot: Secondary | ICD-10-CM | POA: Diagnosis not present

## 2015-10-21 DIAGNOSIS — Z1231 Encounter for screening mammogram for malignant neoplasm of breast: Secondary | ICD-10-CM | POA: Diagnosis not present

## 2015-10-23 DIAGNOSIS — M961 Postlaminectomy syndrome, not elsewhere classified: Secondary | ICD-10-CM | POA: Diagnosis not present

## 2015-10-23 DIAGNOSIS — M4326 Fusion of spine, lumbar region: Secondary | ICD-10-CM | POA: Diagnosis not present

## 2015-10-29 DIAGNOSIS — E010 Iodine-deficiency related diffuse (endemic) goiter: Secondary | ICD-10-CM | POA: Diagnosis not present

## 2015-10-29 DIAGNOSIS — R350 Frequency of micturition: Secondary | ICD-10-CM | POA: Diagnosis not present

## 2015-10-29 DIAGNOSIS — M6281 Muscle weakness (generalized): Secondary | ICD-10-CM | POA: Diagnosis not present

## 2015-10-29 DIAGNOSIS — R201 Hypoesthesia of skin: Secondary | ICD-10-CM | POA: Diagnosis not present

## 2015-10-29 DIAGNOSIS — R262 Difficulty in walking, not elsewhere classified: Secondary | ICD-10-CM | POA: Diagnosis not present

## 2015-10-29 DIAGNOSIS — M4326 Fusion of spine, lumbar region: Secondary | ICD-10-CM | POA: Diagnosis not present

## 2015-10-29 DIAGNOSIS — N3941 Urge incontinence: Secondary | ICD-10-CM | POA: Diagnosis not present

## 2015-10-29 DIAGNOSIS — N318 Other neuromuscular dysfunction of bladder: Secondary | ICD-10-CM | POA: Diagnosis not present

## 2015-10-29 DIAGNOSIS — M545 Low back pain: Secondary | ICD-10-CM | POA: Diagnosis not present

## 2015-10-29 DIAGNOSIS — M79605 Pain in left leg: Secondary | ICD-10-CM | POA: Diagnosis not present

## 2015-10-29 DIAGNOSIS — M2560 Stiffness of unspecified joint, not elsewhere classified: Secondary | ICD-10-CM | POA: Diagnosis not present

## 2015-10-29 DIAGNOSIS — M4716 Other spondylosis with myelopathy, lumbar region: Secondary | ICD-10-CM | POA: Diagnosis not present

## 2015-10-29 DIAGNOSIS — N3945 Continuous leakage: Secondary | ICD-10-CM | POA: Diagnosis not present

## 2015-10-29 DIAGNOSIS — R1310 Dysphagia, unspecified: Secondary | ICD-10-CM | POA: Diagnosis not present

## 2015-10-29 DIAGNOSIS — M79672 Pain in left foot: Secondary | ICD-10-CM | POA: Diagnosis not present

## 2015-10-29 DIAGNOSIS — R293 Abnormal posture: Secondary | ICD-10-CM | POA: Diagnosis not present

## 2015-10-29 DIAGNOSIS — N3000 Acute cystitis without hematuria: Secondary | ICD-10-CM | POA: Diagnosis not present

## 2015-10-31 DIAGNOSIS — R350 Frequency of micturition: Secondary | ICD-10-CM | POA: Diagnosis not present

## 2015-10-31 DIAGNOSIS — N3941 Urge incontinence: Secondary | ICD-10-CM | POA: Diagnosis not present

## 2015-10-31 DIAGNOSIS — N3945 Continuous leakage: Secondary | ICD-10-CM | POA: Diagnosis not present

## 2015-11-06 DIAGNOSIS — R1310 Dysphagia, unspecified: Secondary | ICD-10-CM | POA: Diagnosis not present

## 2015-11-06 DIAGNOSIS — K219 Gastro-esophageal reflux disease without esophagitis: Secondary | ICD-10-CM | POA: Diagnosis not present

## 2015-11-06 DIAGNOSIS — E010 Iodine-deficiency related diffuse (endemic) goiter: Secondary | ICD-10-CM | POA: Diagnosis not present

## 2015-11-06 DIAGNOSIS — K5900 Constipation, unspecified: Secondary | ICD-10-CM | POA: Diagnosis not present

## 2015-11-11 DIAGNOSIS — J45909 Unspecified asthma, uncomplicated: Secondary | ICD-10-CM | POA: Diagnosis not present

## 2015-11-11 DIAGNOSIS — D122 Benign neoplasm of ascending colon: Secondary | ICD-10-CM | POA: Diagnosis not present

## 2015-11-11 DIAGNOSIS — K219 Gastro-esophageal reflux disease without esophagitis: Secondary | ICD-10-CM | POA: Diagnosis not present

## 2015-11-11 DIAGNOSIS — K449 Diaphragmatic hernia without obstruction or gangrene: Secondary | ICD-10-CM | POA: Diagnosis not present

## 2015-11-11 DIAGNOSIS — D123 Benign neoplasm of transverse colon: Secondary | ICD-10-CM | POA: Diagnosis not present

## 2015-11-11 DIAGNOSIS — G4730 Sleep apnea, unspecified: Secondary | ICD-10-CM | POA: Diagnosis not present

## 2015-11-11 DIAGNOSIS — K222 Esophageal obstruction: Secondary | ICD-10-CM | POA: Diagnosis not present

## 2015-11-11 DIAGNOSIS — D126 Benign neoplasm of colon, unspecified: Secondary | ICD-10-CM | POA: Diagnosis not present

## 2015-11-11 DIAGNOSIS — Z1211 Encounter for screening for malignant neoplasm of colon: Secondary | ICD-10-CM | POA: Diagnosis not present

## 2015-11-11 DIAGNOSIS — R1310 Dysphagia, unspecified: Secondary | ICD-10-CM | POA: Diagnosis not present

## 2015-11-11 DIAGNOSIS — F17210 Nicotine dependence, cigarettes, uncomplicated: Secondary | ICD-10-CM | POA: Diagnosis not present

## 2015-11-11 DIAGNOSIS — K228 Other specified diseases of esophagus: Secondary | ICD-10-CM | POA: Diagnosis not present

## 2015-11-11 DIAGNOSIS — I10 Essential (primary) hypertension: Secondary | ICD-10-CM | POA: Diagnosis not present

## 2015-11-11 DIAGNOSIS — Z9049 Acquired absence of other specified parts of digestive tract: Secondary | ICD-10-CM | POA: Diagnosis not present

## 2015-11-11 DIAGNOSIS — Z86010 Personal history of colonic polyps: Secondary | ICD-10-CM | POA: Diagnosis not present

## 2015-11-13 DIAGNOSIS — M79605 Pain in left leg: Secondary | ICD-10-CM | POA: Diagnosis not present

## 2015-11-13 DIAGNOSIS — M4716 Other spondylosis with myelopathy, lumbar region: Secondary | ICD-10-CM | POA: Diagnosis not present

## 2015-11-13 DIAGNOSIS — M6281 Muscle weakness (generalized): Secondary | ICD-10-CM | POA: Diagnosis not present

## 2015-11-13 DIAGNOSIS — M545 Low back pain: Secondary | ICD-10-CM | POA: Diagnosis not present

## 2015-11-13 DIAGNOSIS — R201 Hypoesthesia of skin: Secondary | ICD-10-CM | POA: Diagnosis not present

## 2015-11-13 DIAGNOSIS — M4326 Fusion of spine, lumbar region: Secondary | ICD-10-CM | POA: Diagnosis not present

## 2015-11-14 DIAGNOSIS — K5900 Constipation, unspecified: Secondary | ICD-10-CM | POA: Diagnosis not present

## 2015-11-14 DIAGNOSIS — N3946 Mixed incontinence: Secondary | ICD-10-CM | POA: Diagnosis not present

## 2015-11-14 DIAGNOSIS — M6281 Muscle weakness (generalized): Secondary | ICD-10-CM | POA: Diagnosis not present

## 2015-11-17 DIAGNOSIS — M4326 Fusion of spine, lumbar region: Secondary | ICD-10-CM | POA: Diagnosis not present

## 2015-11-17 DIAGNOSIS — M79605 Pain in left leg: Secondary | ICD-10-CM | POA: Diagnosis not present

## 2015-11-17 DIAGNOSIS — M6281 Muscle weakness (generalized): Secondary | ICD-10-CM | POA: Diagnosis not present

## 2015-11-17 DIAGNOSIS — R201 Hypoesthesia of skin: Secondary | ICD-10-CM | POA: Diagnosis not present

## 2015-11-17 DIAGNOSIS — M545 Low back pain: Secondary | ICD-10-CM | POA: Diagnosis not present

## 2015-11-17 DIAGNOSIS — M4716 Other spondylosis with myelopathy, lumbar region: Secondary | ICD-10-CM | POA: Diagnosis not present

## 2015-11-18 DIAGNOSIS — J441 Chronic obstructive pulmonary disease with (acute) exacerbation: Secondary | ICD-10-CM | POA: Diagnosis not present

## 2015-11-18 DIAGNOSIS — E7800 Pure hypercholesterolemia, unspecified: Secondary | ICD-10-CM | POA: Diagnosis not present

## 2015-11-18 DIAGNOSIS — Z7982 Long term (current) use of aspirin: Secondary | ICD-10-CM | POA: Diagnosis not present

## 2015-11-18 DIAGNOSIS — Z79899 Other long term (current) drug therapy: Secondary | ICD-10-CM | POA: Diagnosis not present

## 2015-11-18 DIAGNOSIS — F17210 Nicotine dependence, cigarettes, uncomplicated: Secondary | ICD-10-CM | POA: Diagnosis not present

## 2015-11-18 DIAGNOSIS — R221 Localized swelling, mass and lump, neck: Secondary | ICD-10-CM | POA: Diagnosis not present

## 2015-11-18 DIAGNOSIS — Z885 Allergy status to narcotic agent status: Secondary | ICD-10-CM | POA: Diagnosis not present

## 2015-11-18 DIAGNOSIS — R0602 Shortness of breath: Secondary | ICD-10-CM | POA: Diagnosis not present

## 2015-11-18 DIAGNOSIS — Z888 Allergy status to other drugs, medicaments and biological substances status: Secondary | ICD-10-CM | POA: Diagnosis not present

## 2015-11-18 DIAGNOSIS — F17200 Nicotine dependence, unspecified, uncomplicated: Secondary | ICD-10-CM | POA: Diagnosis not present

## 2015-11-18 DIAGNOSIS — J029 Acute pharyngitis, unspecified: Secondary | ICD-10-CM | POA: Diagnosis not present

## 2015-11-18 DIAGNOSIS — R001 Bradycardia, unspecified: Secondary | ICD-10-CM | POA: Diagnosis not present

## 2015-11-18 DIAGNOSIS — I10 Essential (primary) hypertension: Secondary | ICD-10-CM | POA: Diagnosis not present

## 2015-11-18 DIAGNOSIS — R079 Chest pain, unspecified: Secondary | ICD-10-CM | POA: Diagnosis not present

## 2015-11-18 DIAGNOSIS — R0789 Other chest pain: Secondary | ICD-10-CM | POA: Diagnosis not present

## 2015-11-19 DIAGNOSIS — R079 Chest pain, unspecified: Secondary | ICD-10-CM | POA: Diagnosis not present

## 2015-11-19 DIAGNOSIS — R0602 Shortness of breath: Secondary | ICD-10-CM | POA: Diagnosis not present

## 2015-11-19 DIAGNOSIS — J029 Acute pharyngitis, unspecified: Secondary | ICD-10-CM | POA: Diagnosis not present

## 2015-11-19 DIAGNOSIS — R221 Localized swelling, mass and lump, neck: Secondary | ICD-10-CM | POA: Diagnosis not present

## 2015-11-20 DIAGNOSIS — M4326 Fusion of spine, lumbar region: Secondary | ICD-10-CM | POA: Diagnosis not present

## 2015-11-20 DIAGNOSIS — M961 Postlaminectomy syndrome, not elsewhere classified: Secondary | ICD-10-CM | POA: Diagnosis not present

## 2015-11-21 DIAGNOSIS — M79605 Pain in left leg: Secondary | ICD-10-CM | POA: Diagnosis not present

## 2015-11-21 DIAGNOSIS — M6281 Muscle weakness (generalized): Secondary | ICD-10-CM | POA: Diagnosis not present

## 2015-11-21 DIAGNOSIS — R201 Hypoesthesia of skin: Secondary | ICD-10-CM | POA: Diagnosis not present

## 2015-11-21 DIAGNOSIS — M4326 Fusion of spine, lumbar region: Secondary | ICD-10-CM | POA: Diagnosis not present

## 2015-11-21 DIAGNOSIS — M545 Low back pain: Secondary | ICD-10-CM | POA: Diagnosis not present

## 2015-11-21 DIAGNOSIS — M4716 Other spondylosis with myelopathy, lumbar region: Secondary | ICD-10-CM | POA: Diagnosis not present

## 2015-11-25 DIAGNOSIS — E041 Nontoxic single thyroid nodule: Secondary | ICD-10-CM | POA: Diagnosis not present

## 2015-11-25 DIAGNOSIS — G2581 Restless legs syndrome: Secondary | ICD-10-CM | POA: Diagnosis not present

## 2015-11-25 DIAGNOSIS — I10 Essential (primary) hypertension: Secondary | ICD-10-CM | POA: Diagnosis not present

## 2015-11-25 DIAGNOSIS — I4891 Unspecified atrial fibrillation: Secondary | ICD-10-CM | POA: Diagnosis not present

## 2015-11-25 DIAGNOSIS — F17210 Nicotine dependence, cigarettes, uncomplicated: Secondary | ICD-10-CM | POA: Diagnosis not present

## 2015-11-25 DIAGNOSIS — J4530 Mild persistent asthma, uncomplicated: Secondary | ICD-10-CM | POA: Diagnosis not present

## 2015-11-25 DIAGNOSIS — F411 Generalized anxiety disorder: Secondary | ICD-10-CM | POA: Diagnosis not present

## 2015-11-25 DIAGNOSIS — J310 Chronic rhinitis: Secondary | ICD-10-CM | POA: Diagnosis not present

## 2015-11-28 DIAGNOSIS — E042 Nontoxic multinodular goiter: Secondary | ICD-10-CM | POA: Diagnosis not present

## 2015-11-28 DIAGNOSIS — E041 Nontoxic single thyroid nodule: Secondary | ICD-10-CM | POA: Diagnosis not present

## 2015-12-02 DIAGNOSIS — M4326 Fusion of spine, lumbar region: Secondary | ICD-10-CM | POA: Diagnosis not present

## 2015-12-02 DIAGNOSIS — M545 Low back pain: Secondary | ICD-10-CM | POA: Diagnosis not present

## 2015-12-02 DIAGNOSIS — M6281 Muscle weakness (generalized): Secondary | ICD-10-CM | POA: Diagnosis not present

## 2015-12-02 DIAGNOSIS — M4716 Other spondylosis with myelopathy, lumbar region: Secondary | ICD-10-CM | POA: Diagnosis not present

## 2015-12-02 DIAGNOSIS — R293 Abnormal posture: Secondary | ICD-10-CM | POA: Diagnosis not present

## 2015-12-02 DIAGNOSIS — M79605 Pain in left leg: Secondary | ICD-10-CM | POA: Diagnosis not present

## 2015-12-02 DIAGNOSIS — R262 Difficulty in walking, not elsewhere classified: Secondary | ICD-10-CM | POA: Diagnosis not present

## 2015-12-02 DIAGNOSIS — M2560 Stiffness of unspecified joint, not elsewhere classified: Secondary | ICD-10-CM | POA: Diagnosis not present

## 2015-12-02 DIAGNOSIS — R201 Hypoesthesia of skin: Secondary | ICD-10-CM | POA: Diagnosis not present

## 2015-12-09 DIAGNOSIS — I4891 Unspecified atrial fibrillation: Secondary | ICD-10-CM | POA: Diagnosis not present

## 2015-12-09 DIAGNOSIS — I10 Essential (primary) hypertension: Secondary | ICD-10-CM | POA: Diagnosis not present

## 2015-12-09 DIAGNOSIS — E041 Nontoxic single thyroid nodule: Secondary | ICD-10-CM | POA: Diagnosis not present

## 2015-12-15 DIAGNOSIS — K222 Esophageal obstruction: Secondary | ICD-10-CM | POA: Diagnosis not present

## 2015-12-15 DIAGNOSIS — K5900 Constipation, unspecified: Secondary | ICD-10-CM | POA: Diagnosis not present

## 2015-12-15 DIAGNOSIS — K219 Gastro-esophageal reflux disease without esophagitis: Secondary | ICD-10-CM | POA: Diagnosis not present

## 2015-12-15 DIAGNOSIS — R1310 Dysphagia, unspecified: Secondary | ICD-10-CM | POA: Diagnosis not present

## 2015-12-17 DIAGNOSIS — R1310 Dysphagia, unspecified: Secondary | ICD-10-CM | POA: Diagnosis not present

## 2015-12-18 DIAGNOSIS — M4326 Fusion of spine, lumbar region: Secondary | ICD-10-CM | POA: Diagnosis not present

## 2015-12-18 DIAGNOSIS — M6281 Muscle weakness (generalized): Secondary | ICD-10-CM | POA: Diagnosis not present

## 2015-12-18 DIAGNOSIS — R201 Hypoesthesia of skin: Secondary | ICD-10-CM | POA: Diagnosis not present

## 2015-12-18 DIAGNOSIS — M4716 Other spondylosis with myelopathy, lumbar region: Secondary | ICD-10-CM | POA: Diagnosis not present

## 2015-12-18 DIAGNOSIS — M545 Low back pain: Secondary | ICD-10-CM | POA: Diagnosis not present

## 2015-12-18 DIAGNOSIS — M79605 Pain in left leg: Secondary | ICD-10-CM | POA: Diagnosis not present

## 2015-12-30 DIAGNOSIS — M4326 Fusion of spine, lumbar region: Secondary | ICD-10-CM | POA: Diagnosis not present

## 2015-12-30 DIAGNOSIS — M79605 Pain in left leg: Secondary | ICD-10-CM | POA: Diagnosis not present

## 2015-12-30 DIAGNOSIS — R262 Difficulty in walking, not elsewhere classified: Secondary | ICD-10-CM | POA: Diagnosis not present

## 2015-12-30 DIAGNOSIS — R293 Abnormal posture: Secondary | ICD-10-CM | POA: Diagnosis not present

## 2015-12-30 DIAGNOSIS — M4716 Other spondylosis with myelopathy, lumbar region: Secondary | ICD-10-CM | POA: Diagnosis not present

## 2015-12-30 DIAGNOSIS — M6281 Muscle weakness (generalized): Secondary | ICD-10-CM | POA: Diagnosis not present

## 2015-12-30 DIAGNOSIS — M2560 Stiffness of unspecified joint, not elsewhere classified: Secondary | ICD-10-CM | POA: Diagnosis not present

## 2015-12-30 DIAGNOSIS — R201 Hypoesthesia of skin: Secondary | ICD-10-CM | POA: Diagnosis not present

## 2015-12-30 DIAGNOSIS — M545 Low back pain: Secondary | ICD-10-CM | POA: Diagnosis not present

## 2016-01-02 DIAGNOSIS — R201 Hypoesthesia of skin: Secondary | ICD-10-CM | POA: Diagnosis not present

## 2016-01-02 DIAGNOSIS — M4326 Fusion of spine, lumbar region: Secondary | ICD-10-CM | POA: Diagnosis not present

## 2016-01-02 DIAGNOSIS — M6281 Muscle weakness (generalized): Secondary | ICD-10-CM | POA: Diagnosis not present

## 2016-01-02 DIAGNOSIS — M545 Low back pain: Secondary | ICD-10-CM | POA: Diagnosis not present

## 2016-01-02 DIAGNOSIS — M4716 Other spondylosis with myelopathy, lumbar region: Secondary | ICD-10-CM | POA: Diagnosis not present

## 2016-01-02 DIAGNOSIS — M79605 Pain in left leg: Secondary | ICD-10-CM | POA: Diagnosis not present

## 2016-01-07 DIAGNOSIS — K222 Esophageal obstruction: Secondary | ICD-10-CM | POA: Diagnosis not present

## 2016-01-07 DIAGNOSIS — R1310 Dysphagia, unspecified: Secondary | ICD-10-CM | POA: Diagnosis not present

## 2016-01-07 DIAGNOSIS — M545 Low back pain: Secondary | ICD-10-CM | POA: Diagnosis not present

## 2016-01-07 DIAGNOSIS — M549 Dorsalgia, unspecified: Secondary | ICD-10-CM | POA: Diagnosis not present

## 2016-01-07 DIAGNOSIS — D126 Benign neoplasm of colon, unspecified: Secondary | ICD-10-CM | POA: Diagnosis not present

## 2016-01-12 DIAGNOSIS — M4716 Other spondylosis with myelopathy, lumbar region: Secondary | ICD-10-CM | POA: Diagnosis not present

## 2016-01-12 DIAGNOSIS — M79605 Pain in left leg: Secondary | ICD-10-CM | POA: Diagnosis not present

## 2016-01-12 DIAGNOSIS — M4326 Fusion of spine, lumbar region: Secondary | ICD-10-CM | POA: Diagnosis not present

## 2016-01-12 DIAGNOSIS — M545 Low back pain: Secondary | ICD-10-CM | POA: Diagnosis not present

## 2016-01-12 DIAGNOSIS — R201 Hypoesthesia of skin: Secondary | ICD-10-CM | POA: Diagnosis not present

## 2016-01-12 DIAGNOSIS — M6281 Muscle weakness (generalized): Secondary | ICD-10-CM | POA: Diagnosis not present

## 2016-01-13 DIAGNOSIS — E7800 Pure hypercholesterolemia, unspecified: Secondary | ICD-10-CM | POA: Diagnosis not present

## 2016-01-13 DIAGNOSIS — R0789 Other chest pain: Secondary | ICD-10-CM | POA: Diagnosis not present

## 2016-01-13 DIAGNOSIS — R001 Bradycardia, unspecified: Secondary | ICD-10-CM | POA: Diagnosis not present

## 2016-01-13 DIAGNOSIS — F17200 Nicotine dependence, unspecified, uncomplicated: Secondary | ICD-10-CM | POA: Diagnosis not present

## 2016-01-14 DIAGNOSIS — M79605 Pain in left leg: Secondary | ICD-10-CM | POA: Diagnosis not present

## 2016-01-14 DIAGNOSIS — M4716 Other spondylosis with myelopathy, lumbar region: Secondary | ICD-10-CM | POA: Diagnosis not present

## 2016-01-14 DIAGNOSIS — M545 Low back pain: Secondary | ICD-10-CM | POA: Diagnosis not present

## 2016-01-14 DIAGNOSIS — R201 Hypoesthesia of skin: Secondary | ICD-10-CM | POA: Diagnosis not present

## 2016-01-14 DIAGNOSIS — M4326 Fusion of spine, lumbar region: Secondary | ICD-10-CM | POA: Diagnosis not present

## 2016-01-14 DIAGNOSIS — M6281 Muscle weakness (generalized): Secondary | ICD-10-CM | POA: Diagnosis not present

## 2016-01-19 DIAGNOSIS — R201 Hypoesthesia of skin: Secondary | ICD-10-CM | POA: Diagnosis not present

## 2016-01-19 DIAGNOSIS — M4326 Fusion of spine, lumbar region: Secondary | ICD-10-CM | POA: Diagnosis not present

## 2016-01-19 DIAGNOSIS — M79605 Pain in left leg: Secondary | ICD-10-CM | POA: Diagnosis not present

## 2016-01-19 DIAGNOSIS — M4716 Other spondylosis with myelopathy, lumbar region: Secondary | ICD-10-CM | POA: Diagnosis not present

## 2016-01-19 DIAGNOSIS — M6281 Muscle weakness (generalized): Secondary | ICD-10-CM | POA: Diagnosis not present

## 2016-01-19 DIAGNOSIS — M545 Low back pain: Secondary | ICD-10-CM | POA: Diagnosis not present

## 2016-01-20 DIAGNOSIS — K219 Gastro-esophageal reflux disease without esophagitis: Secondary | ICD-10-CM | POA: Diagnosis not present

## 2016-01-20 DIAGNOSIS — R110 Nausea: Secondary | ICD-10-CM | POA: Diagnosis not present

## 2016-01-20 DIAGNOSIS — K3189 Other diseases of stomach and duodenum: Secondary | ICD-10-CM | POA: Diagnosis not present

## 2016-01-22 DIAGNOSIS — J310 Chronic rhinitis: Secondary | ICD-10-CM | POA: Diagnosis not present

## 2016-01-22 DIAGNOSIS — F17210 Nicotine dependence, cigarettes, uncomplicated: Secondary | ICD-10-CM | POA: Diagnosis not present

## 2016-01-22 DIAGNOSIS — M545 Low back pain: Secondary | ICD-10-CM | POA: Diagnosis not present

## 2016-01-22 DIAGNOSIS — M4716 Other spondylosis with myelopathy, lumbar region: Secondary | ICD-10-CM | POA: Diagnosis not present

## 2016-01-22 DIAGNOSIS — M6281 Muscle weakness (generalized): Secondary | ICD-10-CM | POA: Diagnosis not present

## 2016-01-22 DIAGNOSIS — G2581 Restless legs syndrome: Secondary | ICD-10-CM | POA: Diagnosis not present

## 2016-01-22 DIAGNOSIS — M79605 Pain in left leg: Secondary | ICD-10-CM | POA: Diagnosis not present

## 2016-01-22 DIAGNOSIS — M4326 Fusion of spine, lumbar region: Secondary | ICD-10-CM | POA: Diagnosis not present

## 2016-01-22 DIAGNOSIS — R201 Hypoesthesia of skin: Secondary | ICD-10-CM | POA: Diagnosis not present

## 2016-01-22 DIAGNOSIS — J4530 Mild persistent asthma, uncomplicated: Secondary | ICD-10-CM | POA: Diagnosis not present

## 2016-01-27 DIAGNOSIS — M4326 Fusion of spine, lumbar region: Secondary | ICD-10-CM | POA: Diagnosis not present

## 2016-01-27 DIAGNOSIS — M4716 Other spondylosis with myelopathy, lumbar region: Secondary | ICD-10-CM | POA: Diagnosis not present

## 2016-01-27 DIAGNOSIS — M6281 Muscle weakness (generalized): Secondary | ICD-10-CM | POA: Diagnosis not present

## 2016-01-27 DIAGNOSIS — M545 Low back pain: Secondary | ICD-10-CM | POA: Diagnosis not present

## 2016-01-27 DIAGNOSIS — R201 Hypoesthesia of skin: Secondary | ICD-10-CM | POA: Diagnosis not present

## 2016-01-27 DIAGNOSIS — M79605 Pain in left leg: Secondary | ICD-10-CM | POA: Diagnosis not present

## 2016-01-30 DIAGNOSIS — F419 Anxiety disorder, unspecified: Secondary | ICD-10-CM | POA: Diagnosis not present

## 2016-01-30 DIAGNOSIS — Z79899 Other long term (current) drug therapy: Secondary | ICD-10-CM | POA: Diagnosis not present

## 2016-01-30 DIAGNOSIS — K219 Gastro-esophageal reflux disease without esophagitis: Secondary | ICD-10-CM | POA: Diagnosis not present

## 2016-01-30 DIAGNOSIS — F17200 Nicotine dependence, unspecified, uncomplicated: Secondary | ICD-10-CM | POA: Diagnosis not present

## 2016-01-30 DIAGNOSIS — R4182 Altered mental status, unspecified: Secondary | ICD-10-CM | POA: Diagnosis not present

## 2016-01-30 DIAGNOSIS — G43909 Migraine, unspecified, not intractable, without status migrainosus: Secondary | ICD-10-CM | POA: Diagnosis not present

## 2016-01-30 DIAGNOSIS — M4692 Unspecified inflammatory spondylopathy, cervical region: Secondary | ICD-10-CM | POA: Diagnosis not present

## 2016-01-30 DIAGNOSIS — J449 Chronic obstructive pulmonary disease, unspecified: Secondary | ICD-10-CM | POA: Diagnosis not present

## 2016-01-30 DIAGNOSIS — M542 Cervicalgia: Secondary | ICD-10-CM | POA: Diagnosis not present

## 2016-01-30 DIAGNOSIS — M4682 Other specified inflammatory spondylopathies, cervical region: Secondary | ICD-10-CM | POA: Diagnosis not present

## 2016-01-30 DIAGNOSIS — I4891 Unspecified atrial fibrillation: Secondary | ICD-10-CM | POA: Diagnosis not present

## 2016-01-30 DIAGNOSIS — I10 Essential (primary) hypertension: Secondary | ICD-10-CM | POA: Diagnosis not present

## 2016-01-30 DIAGNOSIS — E7800 Pure hypercholesterolemia, unspecified: Secondary | ICD-10-CM | POA: Diagnosis not present

## 2016-01-30 DIAGNOSIS — S199XXA Unspecified injury of neck, initial encounter: Secondary | ICD-10-CM | POA: Diagnosis not present

## 2016-02-04 DIAGNOSIS — M79605 Pain in left leg: Secondary | ICD-10-CM | POA: Diagnosis not present

## 2016-02-04 DIAGNOSIS — I10 Essential (primary) hypertension: Secondary | ICD-10-CM | POA: Diagnosis not present

## 2016-02-04 DIAGNOSIS — E041 Nontoxic single thyroid nodule: Secondary | ICD-10-CM | POA: Diagnosis not present

## 2016-02-04 DIAGNOSIS — M2560 Stiffness of unspecified joint, not elsewhere classified: Secondary | ICD-10-CM | POA: Diagnosis not present

## 2016-02-04 DIAGNOSIS — R293 Abnormal posture: Secondary | ICD-10-CM | POA: Diagnosis not present

## 2016-02-04 DIAGNOSIS — M6281 Muscle weakness (generalized): Secondary | ICD-10-CM | POA: Diagnosis not present

## 2016-02-04 DIAGNOSIS — I4891 Unspecified atrial fibrillation: Secondary | ICD-10-CM | POA: Diagnosis not present

## 2016-02-04 DIAGNOSIS — R201 Hypoesthesia of skin: Secondary | ICD-10-CM | POA: Diagnosis not present

## 2016-02-04 DIAGNOSIS — M4716 Other spondylosis with myelopathy, lumbar region: Secondary | ICD-10-CM | POA: Diagnosis not present

## 2016-02-04 DIAGNOSIS — M4326 Fusion of spine, lumbar region: Secondary | ICD-10-CM | POA: Diagnosis not present

## 2016-02-04 DIAGNOSIS — M542 Cervicalgia: Secondary | ICD-10-CM | POA: Diagnosis not present

## 2016-02-04 DIAGNOSIS — R262 Difficulty in walking, not elsewhere classified: Secondary | ICD-10-CM | POA: Diagnosis not present

## 2016-02-05 DIAGNOSIS — M79605 Pain in left leg: Secondary | ICD-10-CM | POA: Diagnosis not present

## 2016-02-05 DIAGNOSIS — R201 Hypoesthesia of skin: Secondary | ICD-10-CM | POA: Diagnosis not present

## 2016-02-05 DIAGNOSIS — R293 Abnormal posture: Secondary | ICD-10-CM | POA: Diagnosis not present

## 2016-02-05 DIAGNOSIS — M6281 Muscle weakness (generalized): Secondary | ICD-10-CM | POA: Diagnosis not present

## 2016-02-05 DIAGNOSIS — M4716 Other spondylosis with myelopathy, lumbar region: Secondary | ICD-10-CM | POA: Diagnosis not present

## 2016-02-05 DIAGNOSIS — M4326 Fusion of spine, lumbar region: Secondary | ICD-10-CM | POA: Diagnosis not present

## 2016-02-09 DIAGNOSIS — R269 Unspecified abnormalities of gait and mobility: Secondary | ICD-10-CM | POA: Diagnosis not present

## 2016-02-09 DIAGNOSIS — R253 Fasciculation: Secondary | ICD-10-CM | POA: Diagnosis not present

## 2016-02-09 DIAGNOSIS — M5442 Lumbago with sciatica, left side: Secondary | ICD-10-CM | POA: Diagnosis not present

## 2016-02-09 DIAGNOSIS — G8929 Other chronic pain: Secondary | ICD-10-CM | POA: Diagnosis not present

## 2016-02-09 DIAGNOSIS — R55 Syncope and collapse: Secondary | ICD-10-CM | POA: Diagnosis not present

## 2016-02-09 DIAGNOSIS — G43009 Migraine without aura, not intractable, without status migrainosus: Secondary | ICD-10-CM | POA: Diagnosis not present

## 2016-02-10 DIAGNOSIS — R55 Syncope and collapse: Secondary | ICD-10-CM | POA: Diagnosis not present

## 2016-02-11 DIAGNOSIS — M4712 Other spondylosis with myelopathy, cervical region: Secondary | ICD-10-CM | POA: Diagnosis not present

## 2016-02-11 DIAGNOSIS — G894 Chronic pain syndrome: Secondary | ICD-10-CM | POA: Diagnosis not present

## 2016-02-11 DIAGNOSIS — Z79891 Long term (current) use of opiate analgesic: Secondary | ICD-10-CM | POA: Diagnosis not present

## 2016-02-11 DIAGNOSIS — M4326 Fusion of spine, lumbar region: Secondary | ICD-10-CM | POA: Diagnosis not present

## 2016-02-11 DIAGNOSIS — M542 Cervicalgia: Secondary | ICD-10-CM | POA: Diagnosis not present

## 2016-02-13 ENCOUNTER — Encounter (HOSPITAL_COMMUNITY): Payer: Self-pay

## 2016-02-13 ENCOUNTER — Emergency Department (HOSPITAL_COMMUNITY)
Admission: EM | Admit: 2016-02-13 | Discharge: 2016-02-13 | Disposition: A | Discharge status: Home / Self Care | Payer: Medicare Other | Attending: Emergency Medicine | Admitting: Emergency Medicine

## 2016-02-13 DIAGNOSIS — N390 Urinary tract infection, site not specified: Secondary | ICD-10-CM | POA: Diagnosis not present

## 2016-02-13 DIAGNOSIS — I10 Essential (primary) hypertension: Secondary | ICD-10-CM | POA: Diagnosis not present

## 2016-02-13 DIAGNOSIS — R569 Unspecified convulsions: Secondary | ICD-10-CM

## 2016-02-13 DIAGNOSIS — G40909 Epilepsy, unspecified, not intractable, without status epilepticus: Secondary | ICD-10-CM | POA: Insufficient documentation

## 2016-02-13 DIAGNOSIS — F17200 Nicotine dependence, unspecified, uncomplicated: Secondary | ICD-10-CM | POA: Diagnosis not present

## 2016-02-13 DIAGNOSIS — G4489 Other headache syndrome: Secondary | ICD-10-CM | POA: Diagnosis not present

## 2016-02-13 DIAGNOSIS — R51 Headache: Secondary | ICD-10-CM | POA: Diagnosis not present

## 2016-02-13 HISTORY — DX: Unspecified convulsions: R56.9

## 2016-02-13 HISTORY — DX: Essential (primary) hypertension: I10

## 2016-02-13 LAB — URINALYSIS, ROUTINE W REFLEX MICROSCOPIC
Bilirubin Urine: NEGATIVE
Glucose, UA: NEGATIVE mg/dL
Hgb urine dipstick: NEGATIVE
Ketones, ur: NEGATIVE mg/dL
Leukocytes, UA: NEGATIVE
Nitrite: POSITIVE — AB
Protein, ur: NEGATIVE mg/dL
Specific Gravity, Urine: 1.01 (ref 1.005–1.030)
pH: 5.5 (ref 5.0–8.0)

## 2016-02-13 LAB — URINE MICROSCOPIC-ADD ON
RBC / HPF: NONE SEEN RBC/hpf (ref 0–5)
WBC, UA: NONE SEEN WBC/hpf (ref 0–5)

## 2016-02-13 LAB — COMPREHENSIVE METABOLIC PANEL
ALT: 15 U/L (ref 14–54)
AST: 14 U/L — ABNORMAL LOW (ref 15–41)
Albumin: 3.3 g/dL — ABNORMAL LOW (ref 3.5–5.0)
Alkaline Phosphatase: 107 U/L (ref 38–126)
Anion gap: 7 (ref 5–15)
BUN: 9 mg/dL (ref 6–20)
CO2: 21 mmol/L — ABNORMAL LOW (ref 22–32)
Calcium: 9 mg/dL (ref 8.9–10.3)
Chloride: 110 mmol/L (ref 101–111)
Creatinine, Ser: 1.06 mg/dL — ABNORMAL HIGH (ref 0.44–1.00)
GFR calc Af Amer: >60 mL/min (ref >60)
GFR calc non Af Amer: 59 mL/min — ABNORMAL LOW (ref >60)
Glucose, Bld: 124 mg/dL — ABNORMAL HIGH (ref 65–99)
Potassium: 3.9 mmol/L (ref 3.5–5.1)
Sodium: 138 mmol/L (ref 135–145)
Total Bilirubin: <0.1 mg/dL — ABNORMAL LOW (ref 0.3–1.2)
Total Protein: 6 g/dL — ABNORMAL LOW (ref 6.5–8.1)

## 2016-02-13 LAB — CBC WITH DIFFERENTIAL/PLATELET
Basophils Absolute: 0 10*3/uL (ref 0.0–0.1)
Basophils Relative: 0 %
Eosinophils Absolute: 0.1 10*3/uL (ref 0.0–0.7)
Eosinophils Relative: 1 %
HCT: 38.5 % (ref 36.0–46.0)
Hemoglobin: 13.2 g/dL (ref 12.0–15.0)
Lymphocytes Relative: 32 %
Lymphs Abs: 2.5 10*3/uL (ref 0.7–4.0)
MCH: 32.1 pg (ref 26.0–34.0)
MCHC: 34.3 g/dL (ref 30.0–36.0)
MCV: 93.7 fL (ref 78.0–100.0)
Monocytes Absolute: 0.6 10*3/uL (ref 0.1–1.0)
Monocytes Relative: 7 %
Neutro Abs: 4.6 10*3/uL (ref 1.7–7.7)
Neutrophils Relative %: 60 %
Platelets: 248 10*3/uL (ref 150–400)
RBC: 4.11 MIL/uL (ref 3.87–5.11)
RDW: 14.4 % (ref 11.5–15.5)
WBC: 7.8 10*3/uL (ref 4.0–10.5)

## 2016-02-13 MED ORDER — PROCHLORPERAZINE EDISYLATE 5 MG/ML IJ SOLN
5.0000 mg | Freq: Once | INTRAMUSCULAR | Status: AC
Start: 2016-02-13 — End: 2016-02-13
  Administered 2016-02-13: 5 mg via INTRAVENOUS
  Filled 2016-02-13: qty 2

## 2016-02-13 MED ORDER — MECLIZINE HCL 25 MG PO TABS
25.0000 mg | ORAL_TABLET | Freq: Once | ORAL | Status: AC
  Administered 2016-02-13: 25 mg via ORAL
  Filled 2016-02-13: qty 1

## 2016-02-13 MED ORDER — DIPHENHYDRAMINE HCL 50 MG/ML IJ SOLN
12.5000 mg | Freq: Once | INTRAMUSCULAR | Status: AC
  Administered 2016-02-13: 12.5 mg via INTRAVENOUS
  Filled 2016-02-13: qty 1

## 2016-02-13 MED ORDER — SODIUM CHLORIDE 0.9 % IV BOLUS (SEPSIS)
1000.0000 mL | Freq: Once | INTRAVENOUS | Status: AC
  Administered 2016-02-13: 1000 mL via INTRAVENOUS

## 2016-02-13 MED ORDER — KETOROLAC TROMETHAMINE 30 MG/ML IJ SOLN
30.0000 mg | Freq: Once | INTRAMUSCULAR | Status: AC
  Administered 2016-02-13: 30 mg via INTRAVENOUS
  Filled 2016-02-13: qty 1

## 2016-02-13 MED ORDER — MECLIZINE HCL 25 MG PO TABS: 25.0000 mg | ORAL_TABLET | Freq: Three times a day (TID) | ORAL | Status: DC | PRN

## 2016-02-13 MED ORDER — SULFAMETHOXAZOLE-TRIMETHOPRIM 800-160 MG PO TABS: 1.0000 | ORAL_TABLET | Freq: Two times a day (BID) | ORAL | Status: AC

## 2016-02-13 NOTE — Discharge Instructions (Signed)
Please take your keppra only 1 time a day. Take the meclizine for dizziness. Take the bactrim for your UTI. YOU need to follow up early next week to discuss your anitseizure medication.  Seizure, Adult A seizure is abnormal electrical activity in the brain. Seizures usually last from 30 seconds to 2 minutes. There are various types of seizures. Before a seizure, you may have a warning sensation (aura) that a seizure is about to occur. An aura may include the following symptoms:   Fear or anxiety.  Nausea.  Feeling like the room is spinning (vertigo).  Vision changes, such as seeing flashing lights or spots. Common symptoms during a seizure include:  A change in attention or behavior (altered mental status).  Convulsions with rhythmic jerking movements.  Drooling.  Rapid eye movements.  Grunting.  Loss of bladder and bowel control.  Bitter taste in the mouth.  Tongue biting. After a seizure, you may feel confused and sleepy. You may also have an injury resulting from convulsions during the seizure. HOME CARE INSTRUCTIONS   If you are given medicines, take them exactly as prescribed by your health care provider.  Keep all follow-up appointments as directed by your health care provider.  Do not swim or drive or engage in risky activity during which a seizure could cause further injury to you or others until your health care provider says it is OK.  Get adequate rest.  Teach friends and family what to do if you have a seizure. They should:  Lay you on the ground to prevent a fall.  Put a cushion under your head.  Loosen any tight clothing around your neck.  Turn you on your side. If vomiting occurs, this helps keep your airway clear.  Stay with you until you recover.  Know whether or not you need emergency care. SEEK IMMEDIATE MEDICAL CARE IF:  The seizure lasts longer than 5 minutes.  The seizure is severe or you do not wake up immediately after the  seizure.  You have an altered mental status after the seizure.  You are having more frequent or worsening seizures. Someone should drive you to the emergency department or call local emergency services (911 in [place] MAKE SURE YOU:  Understand these instructions.  Will watch your condition.  Will get help right away if you are not doing well or get worse.   This information is not intended to replace advice given to you by your health care provider. Make sure you discuss any questions you have with your health care provider.   Document Released: 08/13/2000 Document Revised: 09/06/2014 Document Reviewed: 03/28/2013 Elsevier Interactive Patient Education 2016 [employer]  Urinary Tract Infection Urinary tract infections (UTIs) can develop anywhere along your urinary tract. Your urinary tract is your body's drainage system for removing wastes and extra water. Your urinary tract includes two kidneys, two ureters, a bladder, and a urethra. Your kidneys are a pair of bean-shaped organs. Each kidney is about the size of your fist. They are located below your ribs, one on each side of your spine. CAUSES Infections are caused by microbes, which are microscopic organisms, including fungi, viruses, and bacteria. These organisms are so small that they can only be seen through a microscope. Bacteria are the microbes that most commonly cause UTIs. SYMPTOMS  Symptoms of UTIs may vary by age and gender of the patient and by the location of the infection. Symptoms in young women typically include a frequent and intense urge to urinate and  a painful, burning feeling in the bladder or urethra during urination. Older women and men are more likely to be tired, shaky, and weak and have muscle aches and abdominal pain. A fever may mean the infection is in your kidneys. Other symptoms of a kidney infection include pain in your back or sides below the ribs, nausea, and vomiting. DIAGNOSIS To diagnose a UTI, your  caregiver will ask you about your symptoms. Your caregiver will also ask you to provide a urine sample. The urine sample will be tested for bacteria and white blood cells. White blood cells are made by your body to help fight infection. TREATMENT  Typically, UTIs can be treated with medication. Because most UTIs are caused by a bacterial infection, they usually can be treated with the use of antibiotics. The choice of antibiotic and length of treatment depend on your symptoms and the type of bacteria causing your infection. HOME CARE INSTRUCTIONS  If you were prescribed antibiotics, take them exactly as your caregiver instructs you. Finish the medication even if you feel better after you have only taken some of the medication.  Drink enough water and fluids to keep your urine clear or pale yellow.  Avoid caffeine, tea, and carbonated beverages. They tend to irritate your bladder.  Empty your bladder often. Avoid holding urine for long periods of time.  Empty your bladder before and after sexual intercourse.  After a bowel movement, women should cleanse from front to back. Use each tissue only once. SEEK MEDICAL CARE IF:   You have back pain.  You develop a fever.  Your symptoms do not begin to resolve within 3 days. SEEK IMMEDIATE MEDICAL CARE IF:   You have severe back pain or lower abdominal pain.  You develop chills.  You have nausea or vomiting.  You have continued burning or discomfort with urination. MAKE SURE YOU:   Understand these instructions.  Will watch your condition.  Will get help right away if you are not doing well or get worse.   This information is not intended to replace advice given to you by your health care provider. Make sure you discuss any questions you have with your health care provider.   Document Released: 05/26/2005 Document Revised: 05/07/2015 Document Reviewed: 09/24/2011 Elsevier Interactive Patient Education [employer].

## 2016-02-13 NOTE — ED Notes (Signed)
Patient Alert and oriented X4. Stable and ambulatory. Patient verbalized understanding of the discharge instructions.  Patient belongings were taken by the patient.  

## 2016-02-13 NOTE — ED Notes (Signed)
Patient fell asleep and O2 saturation dropped to 88% on room air.  Applied oxygen at 2L and saturation came up to 94%

## 2016-02-13 NOTE — ED Notes (Signed)
Patient comes from home by EMS.  EMS states they were called out for seizure like activity. Patient started on Kepra 3 days ago.  No post ictal signs or symptoms noted.  Patient lethargic and complains of left sided headache.  EMS also reports initial BP of 190/120.  Patient A&Ox4

## 2016-02-13 NOTE — ED Notes (Signed)
Patient ambulated with assistance without oxygen.  Saturations dropped to 88%.  After sitting down and applying oxygen her saturation came back up to 94%

## 2016-02-13 NOTE — ED Provider Notes (Signed)
CSN: [id number]     Arrival date & time 02/13/16  1515 History   First MD Initiated Contact with Patient 02/13/16 1534     Chief Complaint  Patient presents with  . Headache  . Hypertension     (Consider location/radiation/quality/duration/timing/severity/associated sxs/prior Treatment) HPI KAILYNN ROUGEAU is a(n) 55 y.o. female who presents to the ED with cc  Dragon of seizures, headache and somnolence. History is given by the patient's sisters. They state that she has had about one month of intermittent seizure-like activity, which has been increasing in frequency and duration. She was seen June 1 at [facility] for seizure. I reviewed her records which were sent over by fax that show she had a urinary tract infection, which go out Klebsiella pneumonia and was treated with nitrofurantoin. She has completed, she had a negative head CT, labs without significant abnormality. The patient was referred out to neurology. She was started on On June 12, and had a negative brain MRI. She is scheduled to have an EEG in the near future. Her sisters are concerned because she has been complaining of a constant intractable headache for the past week and a half. Since beginning She has been extremely sleepy and lethargic, barely getting out of bed complaining of the pain in her head. She has seemed to be more confused. This morning prior to arrival in the emergency department the patient had a seizure that lasted approximately 1 minute. Her sister states that it took her about a minute or 2 before she was able to ask what was happening and was confused for about 5 minutes after the event. Her sister described her as having generalized shaking without tongue biting or incontinence. The patient is somnolent but alert and oriented, oriented. She states that she is also feeling extremely ataxic, having double vision and vertigo. She has mild nausea. She has a hx of migraine headaches, however this is nothing like  her previous headaches.   Past Medical History  Diagnosis Date  . Seizures ([place])   . Hypertension    Past Surgical History  Procedure Laterality Date  . Back surgery    . Appendectomy    . Hernia repair    . Cholecystectomy     History reviewed. No pertinent family history. Social History  Substance Use Topics  . Smoking status: Current Every Day Smoker -- 1.00 packs/day  . Smokeless tobacco: None  . Alcohol Use: No   OB History    No data available     Review of Systems Ten systems reviewed and are negative for acute change, except as noted in the HPI.     Allergies  Imitrex; Tramadol; Adhesive; and Morphine and related  Home Medications   Prior to Admission medications   Medication Sig Start Date End Date Taking? Authorizing Provider  albuterol (PROVENTIL HFA;VENTOLIN HFA) 108 (90 Base) MCG/ACT inhaler Inhale 1-2 puffs into the lungs every 6 (six) hours as needed for wheezing or shortness of breath.   Yes Historical Provider, MD  albuterol (PROVENTIL) (2.5 MG/3ML) 0.083% nebulizer solution Take 2.5 mg by nebulization every 6 (six) hours as needed for wheezing or shortness of breath.   Yes Historical Provider, MD  ALPRAZolam Duanne Moron) 0.5 MG tablet Take 0.5 mg by mouth 3 (three) times daily as needed for anxiety.   Yes Historical Provider, MD  azelastine (ASTELIN) 0.1 % nasal spray Place 1 spray into both nostrils 2 (two) times daily. Use in each nostril as directed   Yes  Historical Provider, MD  beclomethasone (QVAR) 80 MCG/ACT inhaler Inhale 1 puff into the lungs 2 (two) times daily.   Yes Historical Provider, MD  diltiazem (DILACOR XR) 240 MG 24 hr capsule Take 240 mg by mouth daily.   Yes Historical Provider, MD  esomeprazole (NEXIUM) 40 MG capsule Take 40 mg by mouth 3 (three) times daily.    Yes Historical Provider, MD  Eszopiclone (ESZOPICLONE) 3 MG TABS Take 3 mg by mouth at bedtime. Take immediately before bedtime   Yes Historical Provider, MD  fluticasone  (FLONASE) 50 MCG/ACT nasal spray Place 1 spray into both nostrils daily.   Yes Historical Provider, MD  fluticasone furoate-vilanterol (BREO ELLIPTA) 200-25 MCG/INH AEPB Inhale 1 puff into the lungs 2 (two) times daily.   Yes Historical Provider, MD  levETIRAcetam (KEPPRA) 500 MG tablet Take 500 mg by mouth 2 (two) times daily.   Yes Historical Provider, MD  nitroGLYCERIN (NITROSTAT) 0.4 MG SL tablet Place 0.4 mg under the tongue every 5 (five) minutes as needed for chest pain.   Yes Historical Provider, MD  rOPINIRole (REQUIP) 1 MG tablet Take 3 mg by mouth at bedtime.    Yes Historical Provider, MD  rosuvastatin (CRESTOR) 5 MG tablet Take 5 mg by mouth daily at 6 PM.   Yes Historical Provider, MD  Vitamin D, Ergocalciferol, (DRISDOL) 50000 units CAPS capsule Take 50,000 Units by mouth every Tuesday.    Yes Historical Provider, MD   BP 129/81 mmHg  Pulse 67  Temp(Src) 97.8 F (36.6 C) (Oral)  Resp 16  SpO2 96% Physical Exam  Constitutional: She is oriented to person, place, and time. She appears well-developed and well-nourished. She appears lethargic. No distress.  HENT:  Head: Normocephalic and atraumatic.  Eyes: Conjunctivae are normal. No scleral icterus.  Neck: Normal range of motion.  Cardiovascular: Normal rate, regular rhythm and normal heart sounds.  Exam reveals no gallop and no friction rub.   No murmur heard. Pulmonary/Chest: Effort normal and breath sounds normal. No respiratory distress.  Abdominal: Soft. Bowel sounds are normal. She exhibits no distension and no mass. There is no tenderness. There is no guarding.  Neurological: She is oriented to person, place, and time. She appears lethargic. GCS eye subscore is 3. GCS verbal subscore is 5. GCS motor subscore is 6.  nystgmus- questionable rotary.  Skin: Skin is warm and dry. She is not diaphoretic.  Nursing note and vitals reviewed.   ED Course  Procedures (including critical care time) Labs Review Labs Reviewed   COMPREHENSIVE METABOLIC PANEL - Abnormal; Notable for the following:    CO2 21 (*)    Glucose, Bld 124 (*)    Creatinine, Ser 1.06 (*)    Total Protein 6.0 (*)    Albumin 3.3 (*)    AST 14 (*)    Total Bilirubin <0.1 (*)    GFR calc non Af Amer 59 (*)    All other components within normal limits  URINALYSIS, ROUTINE W REFLEX MICROSCOPIC (NOT AT [facility]) - Abnormal; Notable for the following:    Nitrite POSITIVE (*)    All other components within normal limits  URINE MICROSCOPIC-ADD ON - Abnormal; Notable for the following:    Squamous Epithelial / LPF 0-5 (*)    Bacteria, UA MANY (*)    All other components within normal limits  URINE CULTURE  CBC WITH DIFFERENTIAL/PLATELET    Imaging Review No results found. I have personally reviewed and evaluated these images and lab results as  part of my medical decision-making.   EKG Interpretation None      MDM   Final diagnoses:  None    Patient here with seizure. I have reviewed her chart and imaging from [facility]. THe patient appears to have a negative MRI.  I suspect she is somnolent because she is post ictal. The patient labs appears wnl expcet form nitrite Pos urin. Patient given rocephin. I spoke with the neurologist who has asked that we decrease her Keppra dose to 1 time daily since her sxs seemed to worsen after starting the medication.Although her oxygen sats dropped with ambulation, she is a chronic smoker and I suppose she has this chronically. She does not complain off respiratory sxs.  I have discussed this with Dr. Tomi Bamberger who agrees that she may follow up with her pcp. Patient will be given Bactrim and meclizine. She is ambulatory and awake nand alert after fluids, and pain treatment. . I have discussed lab and/or imaging findings with the patient and answered all questions/concerns to the best of my ability. Discussed specific return precautions. The patient appears reasonably screened and/or stabilized for discharge and I  doubt any other medical condition or other [facility] requiring further screening, evaluation, or treatment in the ED at this time prior to discharge.   Deirdre Pippins, PA-C 02/16/16 [place], MD 02/17/16 [phone]

## 2016-02-15 LAB — URINE CULTURE: Special Requests: NORMAL

## 2016-02-17 DIAGNOSIS — R569 Unspecified convulsions: Secondary | ICD-10-CM | POA: Diagnosis not present

## 2016-02-17 DIAGNOSIS — R55 Syncope and collapse: Secondary | ICD-10-CM | POA: Diagnosis not present

## 2016-02-20 DIAGNOSIS — G43009 Migraine without aura, not intractable, without status migrainosus: Secondary | ICD-10-CM | POA: Diagnosis not present

## 2016-02-20 DIAGNOSIS — R253 Fasciculation: Secondary | ICD-10-CM | POA: Diagnosis not present

## 2016-02-20 DIAGNOSIS — R269 Unspecified abnormalities of gait and mobility: Secondary | ICD-10-CM | POA: Diagnosis not present

## 2016-02-20 DIAGNOSIS — G8929 Other chronic pain: Secondary | ICD-10-CM | POA: Diagnosis not present

## 2016-02-20 DIAGNOSIS — M5442 Lumbago with sciatica, left side: Secondary | ICD-10-CM | POA: Diagnosis not present

## 2016-02-20 DIAGNOSIS — R55 Syncope and collapse: Secondary | ICD-10-CM | POA: Diagnosis not present

## 2016-02-20 DIAGNOSIS — G40219 Localization-related (focal) (partial) symptomatic epilepsy and epileptic syndromes with complex partial seizures, intractable, without status epilepticus: Secondary | ICD-10-CM | POA: Diagnosis not present

## 2016-02-23 DIAGNOSIS — F17200 Nicotine dependence, unspecified, uncomplicated: Secondary | ICD-10-CM | POA: Diagnosis not present

## 2016-02-23 DIAGNOSIS — R0789 Other chest pain: Secondary | ICD-10-CM | POA: Diagnosis not present

## 2016-02-23 DIAGNOSIS — E7800 Pure hypercholesterolemia, unspecified: Secondary | ICD-10-CM | POA: Diagnosis not present

## 2016-02-24 DIAGNOSIS — M5125 Other intervertebral disc displacement, thoracolumbar region: Secondary | ICD-10-CM | POA: Diagnosis not present

## 2016-02-24 DIAGNOSIS — Z981 Arthrodesis status: Secondary | ICD-10-CM | POA: Diagnosis not present

## 2016-02-24 DIAGNOSIS — M5126 Other intervertebral disc displacement, lumbar region: Secondary | ICD-10-CM | POA: Diagnosis not present

## 2016-02-26 DIAGNOSIS — R293 Abnormal posture: Secondary | ICD-10-CM | POA: Diagnosis not present

## 2016-02-26 DIAGNOSIS — M6281 Muscle weakness (generalized): Secondary | ICD-10-CM | POA: Diagnosis not present

## 2016-02-26 DIAGNOSIS — M4716 Other spondylosis with myelopathy, lumbar region: Secondary | ICD-10-CM | POA: Diagnosis not present

## 2016-02-26 DIAGNOSIS — M79605 Pain in left leg: Secondary | ICD-10-CM | POA: Diagnosis not present

## 2016-02-26 DIAGNOSIS — R201 Hypoesthesia of skin: Secondary | ICD-10-CM | POA: Diagnosis not present

## 2016-02-26 DIAGNOSIS — M4326 Fusion of spine, lumbar region: Secondary | ICD-10-CM | POA: Diagnosis not present

## 2016-03-05 DIAGNOSIS — G40209 Localization-related (focal) (partial) symptomatic epilepsy and epileptic syndromes with complex partial seizures, not intractable, without status epilepticus: Secondary | ICD-10-CM

## 2016-03-05 DIAGNOSIS — G40219 Localization-related (focal) (partial) symptomatic epilepsy and epileptic syndromes with complex partial seizures, intractable, without status epilepticus: Secondary | ICD-10-CM

## 2016-03-05 DIAGNOSIS — G40909 Epilepsy, unspecified, not intractable, without status epilepticus: Secondary | ICD-10-CM | POA: Diagnosis not present

## 2016-03-05 DIAGNOSIS — R51 Headache: Secondary | ICD-10-CM | POA: Diagnosis not present

## 2016-03-05 DIAGNOSIS — E7800 Pure hypercholesterolemia, unspecified: Secondary | ICD-10-CM | POA: Diagnosis not present

## 2016-03-05 DIAGNOSIS — I10 Essential (primary) hypertension: Secondary | ICD-10-CM | POA: Diagnosis not present

## 2016-03-05 DIAGNOSIS — J449 Chronic obstructive pulmonary disease, unspecified: Secondary | ICD-10-CM | POA: Insufficient documentation

## 2016-03-05 DIAGNOSIS — R55 Syncope and collapse: Secondary | ICD-10-CM | POA: Diagnosis not present

## 2016-03-05 DIAGNOSIS — G4733 Obstructive sleep apnea (adult) (pediatric): Secondary | ICD-10-CM | POA: Diagnosis not present

## 2016-03-05 DIAGNOSIS — Z9981 Dependence on supplemental oxygen: Secondary | ICD-10-CM | POA: Diagnosis not present

## 2016-03-05 DIAGNOSIS — R9431 Abnormal electrocardiogram [ECG] [EKG]: Secondary | ICD-10-CM | POA: Diagnosis not present

## 2016-03-05 DIAGNOSIS — Z885 Allergy status to narcotic agent status: Secondary | ICD-10-CM | POA: Diagnosis not present

## 2016-03-05 DIAGNOSIS — G40211 Localization-related (focal) (partial) symptomatic epilepsy and epileptic syndromes with complex partial seizures, intractable, with status epilepticus: Secondary | ICD-10-CM | POA: Diagnosis not present

## 2016-03-05 DIAGNOSIS — Z7951 Long term (current) use of inhaled steroids: Secondary | ICD-10-CM | POA: Diagnosis not present

## 2016-03-05 DIAGNOSIS — E785 Hyperlipidemia, unspecified: Secondary | ICD-10-CM | POA: Diagnosis present

## 2016-03-05 DIAGNOSIS — R569 Unspecified convulsions: Secondary | ICD-10-CM | POA: Diagnosis not present

## 2016-03-05 DIAGNOSIS — Z981 Arthrodesis status: Secondary | ICD-10-CM | POA: Diagnosis not present

## 2016-03-05 DIAGNOSIS — J439 Emphysema, unspecified: Secondary | ICD-10-CM | POA: Diagnosis present

## 2016-03-05 DIAGNOSIS — Z7982 Long term (current) use of aspirin: Secondary | ICD-10-CM | POA: Diagnosis not present

## 2016-03-05 DIAGNOSIS — G40201 Localization-related (focal) (partial) symptomatic epilepsy and epileptic syndromes with complex partial seizures, not intractable, with status epilepticus: Secondary | ICD-10-CM

## 2016-03-05 DIAGNOSIS — F17210 Nicotine dependence, cigarettes, uncomplicated: Secondary | ICD-10-CM | POA: Diagnosis not present

## 2016-03-05 HISTORY — DX: Localization-related (focal) (partial) symptomatic epilepsy and epileptic syndromes with complex partial seizures, not intractable, with status epilepticus: G40.201

## 2016-03-05 HISTORY — DX: Chronic obstructive pulmonary disease, unspecified: J44.9

## 2016-03-05 HISTORY — DX: Localization-related (focal) (partial) symptomatic epilepsy and epileptic syndromes with complex partial seizures, not intractable, without status epilepticus: G40.209

## 2016-03-05 HISTORY — DX: Localization-related (focal) (partial) symptomatic epilepsy and epileptic syndromes with complex partial seizures, intractable, without status epilepticus: G40.219

## 2016-03-11 DIAGNOSIS — M4712 Other spondylosis with myelopathy, cervical region: Secondary | ICD-10-CM | POA: Diagnosis not present

## 2016-03-11 DIAGNOSIS — M4326 Fusion of spine, lumbar region: Secondary | ICD-10-CM | POA: Diagnosis not present

## 2016-03-11 DIAGNOSIS — M549 Dorsalgia, unspecified: Secondary | ICD-10-CM | POA: Diagnosis not present

## 2016-03-11 DIAGNOSIS — M542 Cervicalgia: Secondary | ICD-10-CM | POA: Diagnosis not present

## 2016-03-29 DIAGNOSIS — K219 Gastro-esophageal reflux disease without esophagitis: Secondary | ICD-10-CM | POA: Diagnosis not present

## 2016-03-29 DIAGNOSIS — R1310 Dysphagia, unspecified: Secondary | ICD-10-CM | POA: Diagnosis not present

## 2016-03-29 DIAGNOSIS — K5900 Constipation, unspecified: Secondary | ICD-10-CM | POA: Diagnosis not present

## 2016-04-02 DIAGNOSIS — G8929 Other chronic pain: Secondary | ICD-10-CM | POA: Diagnosis not present

## 2016-04-02 DIAGNOSIS — F445 Conversion disorder with seizures or convulsions: Secondary | ICD-10-CM | POA: Diagnosis not present

## 2016-04-02 DIAGNOSIS — G43009 Migraine without aura, not intractable, without status migrainosus: Secondary | ICD-10-CM | POA: Diagnosis not present

## 2016-04-02 DIAGNOSIS — M5442 Lumbago with sciatica, left side: Secondary | ICD-10-CM | POA: Diagnosis not present

## 2016-04-02 DIAGNOSIS — R269 Unspecified abnormalities of gait and mobility: Secondary | ICD-10-CM | POA: Diagnosis not present

## 2016-04-02 DIAGNOSIS — G40219 Localization-related (focal) (partial) symptomatic epilepsy and epileptic syndromes with complex partial seizures, intractable, without status epilepticus: Secondary | ICD-10-CM | POA: Diagnosis not present

## 2016-04-06 DIAGNOSIS — I10 Essential (primary) hypertension: Secondary | ICD-10-CM | POA: Diagnosis not present

## 2016-04-06 DIAGNOSIS — D519 Vitamin B12 deficiency anemia, unspecified: Secondary | ICD-10-CM | POA: Diagnosis not present

## 2016-04-06 DIAGNOSIS — R5383 Other fatigue: Secondary | ICD-10-CM | POA: Diagnosis not present

## 2016-04-06 DIAGNOSIS — E785 Hyperlipidemia, unspecified: Secondary | ICD-10-CM | POA: Diagnosis not present

## 2016-04-06 DIAGNOSIS — Z79899 Other long term (current) drug therapy: Secondary | ICD-10-CM | POA: Diagnosis not present

## 2016-04-06 DIAGNOSIS — Z23 Encounter for immunization: Secondary | ICD-10-CM | POA: Diagnosis not present

## 2016-04-08 DIAGNOSIS — J4530 Mild persistent asthma, uncomplicated: Secondary | ICD-10-CM | POA: Diagnosis not present

## 2016-04-08 DIAGNOSIS — J310 Chronic rhinitis: Secondary | ICD-10-CM | POA: Diagnosis not present

## 2016-04-08 DIAGNOSIS — G2581 Restless legs syndrome: Secondary | ICD-10-CM | POA: Diagnosis not present

## 2016-04-08 DIAGNOSIS — F411 Generalized anxiety disorder: Secondary | ICD-10-CM | POA: Diagnosis not present

## 2016-04-09 DIAGNOSIS — E538 Deficiency of other specified B group vitamins: Secondary | ICD-10-CM | POA: Diagnosis not present

## 2016-04-14 DIAGNOSIS — G894 Chronic pain syndrome: Secondary | ICD-10-CM | POA: Diagnosis not present

## 2016-04-14 DIAGNOSIS — M4326 Fusion of spine, lumbar region: Secondary | ICD-10-CM | POA: Diagnosis not present

## 2016-04-14 DIAGNOSIS — M4806 Spinal stenosis, lumbar region: Secondary | ICD-10-CM | POA: Diagnosis not present

## 2016-04-14 DIAGNOSIS — Z79899 Other long term (current) drug therapy: Secondary | ICD-10-CM | POA: Diagnosis not present

## 2016-04-14 DIAGNOSIS — M4716 Other spondylosis with myelopathy, lumbar region: Secondary | ICD-10-CM | POA: Diagnosis not present

## 2016-04-15 DIAGNOSIS — E538 Deficiency of other specified B group vitamins: Secondary | ICD-10-CM | POA: Diagnosis not present

## 2016-04-20 DIAGNOSIS — R002 Palpitations: Secondary | ICD-10-CM | POA: Diagnosis not present

## 2016-04-20 DIAGNOSIS — M62838 Other muscle spasm: Secondary | ICD-10-CM | POA: Diagnosis not present

## 2016-04-20 DIAGNOSIS — R0602 Shortness of breath: Secondary | ICD-10-CM | POA: Diagnosis not present

## 2016-04-20 DIAGNOSIS — M25512 Pain in left shoulder: Secondary | ICD-10-CM | POA: Diagnosis not present

## 2016-04-20 DIAGNOSIS — R05 Cough: Secondary | ICD-10-CM | POA: Diagnosis not present

## 2016-04-22 DIAGNOSIS — E538 Deficiency of other specified B group vitamins: Secondary | ICD-10-CM | POA: Diagnosis not present

## 2016-04-27 DIAGNOSIS — M2560 Stiffness of unspecified joint, not elsewhere classified: Secondary | ICD-10-CM | POA: Diagnosis not present

## 2016-04-27 DIAGNOSIS — M4326 Fusion of spine, lumbar region: Secondary | ICD-10-CM | POA: Diagnosis not present

## 2016-04-27 DIAGNOSIS — R293 Abnormal posture: Secondary | ICD-10-CM | POA: Diagnosis not present

## 2016-04-27 DIAGNOSIS — M79605 Pain in left leg: Secondary | ICD-10-CM | POA: Diagnosis not present

## 2016-04-27 DIAGNOSIS — M6281 Muscle weakness (generalized): Secondary | ICD-10-CM | POA: Diagnosis not present

## 2016-04-27 DIAGNOSIS — M25612 Stiffness of left shoulder, not elsewhere classified: Secondary | ICD-10-CM | POA: Diagnosis not present

## 2016-04-27 DIAGNOSIS — M4716 Other spondylosis with myelopathy, lumbar region: Secondary | ICD-10-CM | POA: Diagnosis not present

## 2016-04-27 DIAGNOSIS — R2681 Unsteadiness on feet: Secondary | ICD-10-CM | POA: Diagnosis not present

## 2016-04-27 DIAGNOSIS — M25512 Pain in left shoulder: Secondary | ICD-10-CM | POA: Diagnosis not present

## 2016-04-27 DIAGNOSIS — M545 Low back pain: Secondary | ICD-10-CM | POA: Diagnosis not present

## 2016-04-27 DIAGNOSIS — R201 Hypoesthesia of skin: Secondary | ICD-10-CM | POA: Diagnosis not present

## 2016-04-29 DIAGNOSIS — E538 Deficiency of other specified B group vitamins: Secondary | ICD-10-CM | POA: Diagnosis not present

## 2016-04-30 DIAGNOSIS — M25512 Pain in left shoulder: Secondary | ICD-10-CM | POA: Diagnosis not present

## 2016-04-30 DIAGNOSIS — M4326 Fusion of spine, lumbar region: Secondary | ICD-10-CM | POA: Diagnosis not present

## 2016-04-30 DIAGNOSIS — M7552 Bursitis of left shoulder: Secondary | ICD-10-CM | POA: Diagnosis not present

## 2016-05-04 DIAGNOSIS — M79605 Pain in left leg: Secondary | ICD-10-CM | POA: Diagnosis not present

## 2016-05-04 DIAGNOSIS — M25612 Stiffness of left shoulder, not elsewhere classified: Secondary | ICD-10-CM | POA: Diagnosis not present

## 2016-05-04 DIAGNOSIS — M545 Low back pain: Secondary | ICD-10-CM | POA: Diagnosis not present

## 2016-05-04 DIAGNOSIS — R41841 Cognitive communication deficit: Secondary | ICD-10-CM | POA: Diagnosis not present

## 2016-05-04 DIAGNOSIS — M6281 Muscle weakness (generalized): Secondary | ICD-10-CM | POA: Diagnosis not present

## 2016-05-04 DIAGNOSIS — R2681 Unsteadiness on feet: Secondary | ICD-10-CM | POA: Diagnosis not present

## 2016-05-04 DIAGNOSIS — M4326 Fusion of spine, lumbar region: Secondary | ICD-10-CM | POA: Diagnosis not present

## 2016-05-04 DIAGNOSIS — G40409 Other generalized epilepsy and epileptic syndromes, not intractable, without status epilepticus: Secondary | ICD-10-CM | POA: Diagnosis not present

## 2016-05-04 DIAGNOSIS — M25512 Pain in left shoulder: Secondary | ICD-10-CM | POA: Diagnosis not present

## 2016-05-04 DIAGNOSIS — M4716 Other spondylosis with myelopathy, lumbar region: Secondary | ICD-10-CM | POA: Diagnosis not present

## 2016-05-04 DIAGNOSIS — R201 Hypoesthesia of skin: Secondary | ICD-10-CM | POA: Diagnosis not present

## 2016-05-04 DIAGNOSIS — R293 Abnormal posture: Secondary | ICD-10-CM | POA: Diagnosis not present

## 2016-05-04 DIAGNOSIS — M4806 Spinal stenosis, lumbar region: Secondary | ICD-10-CM | POA: Diagnosis not present

## 2016-05-11 DIAGNOSIS — G40409 Other generalized epilepsy and epileptic syndromes, not intractable, without status epilepticus: Secondary | ICD-10-CM | POA: Diagnosis not present

## 2016-05-11 DIAGNOSIS — M4806 Spinal stenosis, lumbar region: Secondary | ICD-10-CM | POA: Diagnosis not present

## 2016-05-11 DIAGNOSIS — R41841 Cognitive communication deficit: Secondary | ICD-10-CM | POA: Diagnosis not present

## 2016-05-11 DIAGNOSIS — M4326 Fusion of spine, lumbar region: Secondary | ICD-10-CM | POA: Diagnosis not present

## 2016-05-11 DIAGNOSIS — R201 Hypoesthesia of skin: Secondary | ICD-10-CM | POA: Diagnosis not present

## 2016-05-11 DIAGNOSIS — M4716 Other spondylosis with myelopathy, lumbar region: Secondary | ICD-10-CM | POA: Diagnosis not present

## 2016-05-13 DIAGNOSIS — M4716 Other spondylosis with myelopathy, lumbar region: Secondary | ICD-10-CM | POA: Diagnosis not present

## 2016-05-13 DIAGNOSIS — R41841 Cognitive communication deficit: Secondary | ICD-10-CM | POA: Diagnosis not present

## 2016-05-13 DIAGNOSIS — M4806 Spinal stenosis, lumbar region: Secondary | ICD-10-CM | POA: Diagnosis not present

## 2016-05-13 DIAGNOSIS — G40409 Other generalized epilepsy and epileptic syndromes, not intractable, without status epilepticus: Secondary | ICD-10-CM | POA: Diagnosis not present

## 2016-05-13 DIAGNOSIS — R201 Hypoesthesia of skin: Secondary | ICD-10-CM | POA: Diagnosis not present

## 2016-05-13 DIAGNOSIS — M4326 Fusion of spine, lumbar region: Secondary | ICD-10-CM | POA: Diagnosis not present

## 2016-05-14 DIAGNOSIS — M7552 Bursitis of left shoulder: Secondary | ICD-10-CM | POA: Diagnosis not present

## 2016-05-14 DIAGNOSIS — M4716 Other spondylosis with myelopathy, lumbar region: Secondary | ICD-10-CM | POA: Diagnosis not present

## 2016-05-14 DIAGNOSIS — M25512 Pain in left shoulder: Secondary | ICD-10-CM | POA: Diagnosis not present

## 2016-05-14 DIAGNOSIS — G894 Chronic pain syndrome: Secondary | ICD-10-CM | POA: Diagnosis not present

## 2016-05-17 DIAGNOSIS — M4326 Fusion of spine, lumbar region: Secondary | ICD-10-CM | POA: Diagnosis not present

## 2016-05-17 DIAGNOSIS — M4716 Other spondylosis with myelopathy, lumbar region: Secondary | ICD-10-CM | POA: Diagnosis not present

## 2016-05-17 DIAGNOSIS — R201 Hypoesthesia of skin: Secondary | ICD-10-CM | POA: Diagnosis not present

## 2016-05-17 DIAGNOSIS — R41841 Cognitive communication deficit: Secondary | ICD-10-CM | POA: Diagnosis not present

## 2016-05-17 DIAGNOSIS — G40409 Other generalized epilepsy and epileptic syndromes, not intractable, without status epilepticus: Secondary | ICD-10-CM | POA: Diagnosis not present

## 2016-05-17 DIAGNOSIS — M4806 Spinal stenosis, lumbar region: Secondary | ICD-10-CM | POA: Diagnosis not present

## 2016-05-18 DIAGNOSIS — G40909 Epilepsy, unspecified, not intractable, without status epilepticus: Secondary | ICD-10-CM | POA: Diagnosis not present

## 2016-05-18 DIAGNOSIS — M4716 Other spondylosis with myelopathy, lumbar region: Secondary | ICD-10-CM | POA: Diagnosis not present

## 2016-05-18 DIAGNOSIS — E041 Nontoxic single thyroid nodule: Secondary | ICD-10-CM

## 2016-05-18 DIAGNOSIS — E7800 Pure hypercholesterolemia, unspecified: Secondary | ICD-10-CM | POA: Diagnosis not present

## 2016-05-18 DIAGNOSIS — M5137 Other intervertebral disc degeneration, lumbosacral region: Secondary | ICD-10-CM | POA: Insufficient documentation

## 2016-05-18 DIAGNOSIS — F5101 Primary insomnia: Secondary | ICD-10-CM | POA: Insufficient documentation

## 2016-05-18 DIAGNOSIS — R41841 Cognitive communication deficit: Secondary | ICD-10-CM | POA: Diagnosis not present

## 2016-05-18 DIAGNOSIS — G40409 Other generalized epilepsy and epileptic syndromes, not intractable, without status epilepticus: Secondary | ICD-10-CM | POA: Diagnosis not present

## 2016-05-18 DIAGNOSIS — R201 Hypoesthesia of skin: Secondary | ICD-10-CM | POA: Diagnosis not present

## 2016-05-18 DIAGNOSIS — M51379 Other intervertebral disc degeneration, lumbosacral region without mention of lumbar back pain or lower extremity pain: Secondary | ICD-10-CM

## 2016-05-18 DIAGNOSIS — J432 Centrilobular emphysema: Secondary | ICD-10-CM | POA: Insufficient documentation

## 2016-05-18 DIAGNOSIS — K317 Polyp of stomach and duodenum: Secondary | ICD-10-CM | POA: Insufficient documentation

## 2016-05-18 DIAGNOSIS — M5136 Other intervertebral disc degeneration, lumbar region: Secondary | ICD-10-CM | POA: Diagnosis not present

## 2016-05-18 DIAGNOSIS — M19012 Primary osteoarthritis, left shoulder: Secondary | ICD-10-CM | POA: Diagnosis not present

## 2016-05-18 DIAGNOSIS — E538 Deficiency of other specified B group vitamins: Secondary | ICD-10-CM | POA: Insufficient documentation

## 2016-05-18 DIAGNOSIS — R7309 Other abnormal glucose: Secondary | ICD-10-CM | POA: Diagnosis not present

## 2016-05-18 DIAGNOSIS — M4806 Spinal stenosis, lumbar region: Secondary | ICD-10-CM | POA: Diagnosis not present

## 2016-05-18 DIAGNOSIS — M25512 Pain in left shoulder: Secondary | ICD-10-CM | POA: Diagnosis not present

## 2016-05-18 DIAGNOSIS — G40219 Localization-related (focal) (partial) symptomatic epilepsy and epileptic syndromes with complex partial seizures, intractable, without status epilepticus: Secondary | ICD-10-CM | POA: Diagnosis not present

## 2016-05-18 DIAGNOSIS — M4326 Fusion of spine, lumbar region: Secondary | ICD-10-CM | POA: Diagnosis not present

## 2016-05-18 HISTORY — DX: Other intervertebral disc degeneration, lumbosacral region: M51.37

## 2016-05-18 HISTORY — DX: Primary insomnia: F51.01

## 2016-05-18 HISTORY — DX: Centrilobular emphysema: J43.2

## 2016-05-18 HISTORY — DX: Deficiency of other specified B group vitamins: E53.8

## 2016-05-18 HISTORY — DX: Nontoxic single thyroid nodule: E04.1

## 2016-05-18 HISTORY — DX: Polyp of stomach and duodenum: K31.7

## 2016-05-18 HISTORY — DX: Other intervertebral disc degeneration, lumbosacral region without mention of lumbar back pain or lower extremity pain: M51.379

## 2016-05-19 DIAGNOSIS — M25512 Pain in left shoulder: Secondary | ICD-10-CM | POA: Diagnosis not present

## 2016-05-25 DIAGNOSIS — D72829 Elevated white blood cell count, unspecified: Secondary | ICD-10-CM | POA: Diagnosis not present

## 2016-05-25 DIAGNOSIS — M4716 Other spondylosis with myelopathy, lumbar region: Secondary | ICD-10-CM | POA: Diagnosis not present

## 2016-05-25 DIAGNOSIS — R41841 Cognitive communication deficit: Secondary | ICD-10-CM | POA: Diagnosis not present

## 2016-05-25 DIAGNOSIS — R201 Hypoesthesia of skin: Secondary | ICD-10-CM | POA: Diagnosis not present

## 2016-05-25 DIAGNOSIS — G40409 Other generalized epilepsy and epileptic syndromes, not intractable, without status epilepticus: Secondary | ICD-10-CM | POA: Diagnosis not present

## 2016-05-25 DIAGNOSIS — M4806 Spinal stenosis, lumbar region: Secondary | ICD-10-CM | POA: Diagnosis not present

## 2016-05-25 DIAGNOSIS — M4326 Fusion of spine, lumbar region: Secondary | ICD-10-CM | POA: Diagnosis not present

## 2016-05-26 DIAGNOSIS — J310 Chronic rhinitis: Secondary | ICD-10-CM | POA: Diagnosis not present

## 2016-05-26 DIAGNOSIS — F411 Generalized anxiety disorder: Secondary | ICD-10-CM | POA: Diagnosis not present

## 2016-05-26 DIAGNOSIS — M542 Cervicalgia: Secondary | ICD-10-CM | POA: Diagnosis not present

## 2016-05-26 DIAGNOSIS — E041 Nontoxic single thyroid nodule: Secondary | ICD-10-CM | POA: Diagnosis not present

## 2016-05-26 DIAGNOSIS — I10 Essential (primary) hypertension: Secondary | ICD-10-CM | POA: Diagnosis not present

## 2016-05-26 DIAGNOSIS — I4891 Unspecified atrial fibrillation: Secondary | ICD-10-CM | POA: Diagnosis not present

## 2016-05-26 DIAGNOSIS — F17210 Nicotine dependence, cigarettes, uncomplicated: Secondary | ICD-10-CM | POA: Diagnosis not present

## 2016-05-26 DIAGNOSIS — G2581 Restless legs syndrome: Secondary | ICD-10-CM | POA: Diagnosis not present

## 2016-05-27 DIAGNOSIS — M4326 Fusion of spine, lumbar region: Secondary | ICD-10-CM | POA: Diagnosis not present

## 2016-05-27 DIAGNOSIS — M4716 Other spondylosis with myelopathy, lumbar region: Secondary | ICD-10-CM | POA: Diagnosis not present

## 2016-05-27 DIAGNOSIS — R201 Hypoesthesia of skin: Secondary | ICD-10-CM | POA: Diagnosis not present

## 2016-05-27 DIAGNOSIS — G40409 Other generalized epilepsy and epileptic syndromes, not intractable, without status epilepticus: Secondary | ICD-10-CM | POA: Diagnosis not present

## 2016-05-27 DIAGNOSIS — J4531 Mild persistent asthma with (acute) exacerbation: Secondary | ICD-10-CM | POA: Diagnosis not present

## 2016-05-27 DIAGNOSIS — M4806 Spinal stenosis, lumbar region: Secondary | ICD-10-CM | POA: Diagnosis not present

## 2016-05-27 DIAGNOSIS — R41841 Cognitive communication deficit: Secondary | ICD-10-CM | POA: Diagnosis not present

## 2016-05-31 DIAGNOSIS — G2581 Restless legs syndrome: Secondary | ICD-10-CM | POA: Diagnosis not present

## 2016-05-31 DIAGNOSIS — J4520 Mild intermittent asthma, uncomplicated: Secondary | ICD-10-CM | POA: Diagnosis not present

## 2016-05-31 DIAGNOSIS — J9690 Respiratory failure, unspecified, unspecified whether with hypoxia or hypercapnia: Secondary | ICD-10-CM | POA: Diagnosis not present

## 2016-05-31 DIAGNOSIS — G4733 Obstructive sleep apnea (adult) (pediatric): Secondary | ICD-10-CM | POA: Diagnosis not present

## 2016-05-31 DIAGNOSIS — J4531 Mild persistent asthma with (acute) exacerbation: Secondary | ICD-10-CM | POA: Diagnosis present

## 2016-05-31 DIAGNOSIS — B3789 Other sites of candidiasis: Secondary | ICD-10-CM | POA: Diagnosis present

## 2016-05-31 DIAGNOSIS — J189 Pneumonia, unspecified organism: Secondary | ICD-10-CM | POA: Diagnosis not present

## 2016-05-31 DIAGNOSIS — J449 Chronic obstructive pulmonary disease, unspecified: Secondary | ICD-10-CM | POA: Diagnosis not present

## 2016-05-31 DIAGNOSIS — I10 Essential (primary) hypertension: Secondary | ICD-10-CM | POA: Diagnosis present

## 2016-05-31 DIAGNOSIS — J4521 Mild intermittent asthma with (acute) exacerbation: Secondary | ICD-10-CM | POA: Diagnosis not present

## 2016-05-31 DIAGNOSIS — Z79899 Other long term (current) drug therapy: Secondary | ICD-10-CM | POA: Diagnosis not present

## 2016-05-31 DIAGNOSIS — F17210 Nicotine dependence, cigarettes, uncomplicated: Secondary | ICD-10-CM | POA: Diagnosis not present

## 2016-05-31 DIAGNOSIS — J180 Bronchopneumonia, unspecified organism: Secondary | ICD-10-CM | POA: Diagnosis not present

## 2016-05-31 DIAGNOSIS — J310 Chronic rhinitis: Secondary | ICD-10-CM | POA: Diagnosis not present

## 2016-06-07 DIAGNOSIS — R5383 Other fatigue: Secondary | ICD-10-CM | POA: Diagnosis not present

## 2016-06-07 DIAGNOSIS — R269 Unspecified abnormalities of gait and mobility: Secondary | ICD-10-CM | POA: Diagnosis not present

## 2016-06-07 DIAGNOSIS — G43009 Migraine without aura, not intractable, without status migrainosus: Secondary | ICD-10-CM | POA: Diagnosis not present

## 2016-06-07 DIAGNOSIS — G40219 Localization-related (focal) (partial) symptomatic epilepsy and epileptic syndromes with complex partial seizures, intractable, without status epilepticus: Secondary | ICD-10-CM | POA: Diagnosis not present

## 2016-06-07 DIAGNOSIS — R55 Syncope and collapse: Secondary | ICD-10-CM | POA: Diagnosis not present

## 2016-06-07 DIAGNOSIS — J4521 Mild intermittent asthma with (acute) exacerbation: Secondary | ICD-10-CM | POA: Diagnosis not present

## 2016-06-07 DIAGNOSIS — M5442 Lumbago with sciatica, left side: Secondary | ICD-10-CM | POA: Diagnosis not present

## 2016-06-07 DIAGNOSIS — G2581 Restless legs syndrome: Secondary | ICD-10-CM | POA: Diagnosis not present

## 2016-06-07 DIAGNOSIS — G8929 Other chronic pain: Secondary | ICD-10-CM | POA: Diagnosis not present

## 2016-06-07 DIAGNOSIS — F445 Conversion disorder with seizures or convulsions: Secondary | ICD-10-CM | POA: Diagnosis not present

## 2016-06-07 DIAGNOSIS — F17210 Nicotine dependence, cigarettes, uncomplicated: Secondary | ICD-10-CM | POA: Diagnosis not present

## 2016-06-10 DIAGNOSIS — J310 Chronic rhinitis: Secondary | ICD-10-CM | POA: Diagnosis not present

## 2016-06-10 DIAGNOSIS — R5383 Other fatigue: Secondary | ICD-10-CM | POA: Diagnosis not present

## 2016-06-10 DIAGNOSIS — F411 Generalized anxiety disorder: Secondary | ICD-10-CM | POA: Diagnosis not present

## 2016-06-10 DIAGNOSIS — J4531 Mild persistent asthma with (acute) exacerbation: Secondary | ICD-10-CM | POA: Diagnosis not present

## 2016-06-10 DIAGNOSIS — G40409 Other generalized epilepsy and epileptic syndromes, not intractable, without status epilepticus: Secondary | ICD-10-CM | POA: Diagnosis not present

## 2016-06-10 DIAGNOSIS — R41841 Cognitive communication deficit: Secondary | ICD-10-CM | POA: Diagnosis not present

## 2016-06-10 DIAGNOSIS — G3184 Mild cognitive impairment, so stated: Secondary | ICD-10-CM | POA: Diagnosis not present

## 2016-06-14 DIAGNOSIS — M4326 Fusion of spine, lumbar region: Secondary | ICD-10-CM | POA: Diagnosis not present

## 2016-06-14 DIAGNOSIS — M4712 Other spondylosis with myelopathy, cervical region: Secondary | ICD-10-CM | POA: Diagnosis not present

## 2016-06-14 DIAGNOSIS — M549 Dorsalgia, unspecified: Secondary | ICD-10-CM | POA: Diagnosis not present

## 2016-06-14 DIAGNOSIS — G894 Chronic pain syndrome: Secondary | ICD-10-CM | POA: Diagnosis not present

## 2016-06-16 DIAGNOSIS — G4733 Obstructive sleep apnea (adult) (pediatric): Secondary | ICD-10-CM | POA: Diagnosis not present

## 2016-06-16 DIAGNOSIS — E7800 Pure hypercholesterolemia, unspecified: Secondary | ICD-10-CM | POA: Diagnosis not present

## 2016-06-16 DIAGNOSIS — F17200 Nicotine dependence, unspecified, uncomplicated: Secondary | ICD-10-CM | POA: Diagnosis not present

## 2016-06-16 DIAGNOSIS — R0789 Other chest pain: Secondary | ICD-10-CM | POA: Diagnosis not present

## 2016-06-17 DIAGNOSIS — Z01419 Encounter for gynecological examination (general) (routine) without abnormal findings: Secondary | ICD-10-CM | POA: Diagnosis not present

## 2016-06-17 DIAGNOSIS — J432 Centrilobular emphysema: Secondary | ICD-10-CM | POA: Diagnosis not present

## 2016-06-17 DIAGNOSIS — F17200 Nicotine dependence, unspecified, uncomplicated: Secondary | ICD-10-CM | POA: Diagnosis not present

## 2016-06-17 DIAGNOSIS — R7303 Prediabetes: Secondary | ICD-10-CM | POA: Diagnosis not present

## 2016-06-21 DIAGNOSIS — R569 Unspecified convulsions: Secondary | ICD-10-CM | POA: Diagnosis not present

## 2016-06-21 DIAGNOSIS — R51 Headache: Secondary | ICD-10-CM | POA: Diagnosis not present

## 2016-06-30 DIAGNOSIS — E1122 Type 2 diabetes mellitus with diabetic chronic kidney disease: Secondary | ICD-10-CM | POA: Insufficient documentation

## 2016-06-30 DIAGNOSIS — N1831 Chronic kidney disease, stage 3a: Secondary | ICD-10-CM

## 2016-06-30 DIAGNOSIS — E119 Type 2 diabetes mellitus without complications: Secondary | ICD-10-CM | POA: Insufficient documentation

## 2016-06-30 DIAGNOSIS — Z794 Long term (current) use of insulin: Secondary | ICD-10-CM | POA: Insufficient documentation

## 2016-06-30 HISTORY — DX: Chronic kidney disease, stage 3a: N18.31

## 2016-06-30 HISTORY — DX: Long term (current) use of insulin: Z79.4

## 2016-06-30 HISTORY — DX: Type 2 diabetes mellitus without complications: E11.9

## 2016-06-30 HISTORY — DX: Type 2 diabetes mellitus with diabetic chronic kidney disease: E11.22

## 2016-07-01 DIAGNOSIS — J4530 Mild persistent asthma, uncomplicated: Secondary | ICD-10-CM | POA: Diagnosis not present

## 2016-07-01 DIAGNOSIS — G2581 Restless legs syndrome: Secondary | ICD-10-CM | POA: Diagnosis not present

## 2016-07-01 DIAGNOSIS — R5383 Other fatigue: Secondary | ICD-10-CM | POA: Diagnosis not present

## 2016-07-01 DIAGNOSIS — G4733 Obstructive sleep apnea (adult) (pediatric): Secondary | ICD-10-CM | POA: Diagnosis not present

## 2016-07-01 DIAGNOSIS — F17210 Nicotine dependence, cigarettes, uncomplicated: Secondary | ICD-10-CM | POA: Diagnosis not present

## 2016-07-02 DIAGNOSIS — G4733 Obstructive sleep apnea (adult) (pediatric): Secondary | ICD-10-CM | POA: Diagnosis not present

## 2016-07-05 DIAGNOSIS — G8929 Other chronic pain: Secondary | ICD-10-CM | POA: Diagnosis not present

## 2016-07-05 DIAGNOSIS — R55 Syncope and collapse: Secondary | ICD-10-CM | POA: Diagnosis not present

## 2016-07-05 DIAGNOSIS — G2581 Restless legs syndrome: Secondary | ICD-10-CM | POA: Diagnosis not present

## 2016-07-05 DIAGNOSIS — M5442 Lumbago with sciatica, left side: Secondary | ICD-10-CM | POA: Diagnosis not present

## 2016-07-05 DIAGNOSIS — G40219 Localization-related (focal) (partial) symptomatic epilepsy and epileptic syndromes with complex partial seizures, intractable, without status epilepticus: Secondary | ICD-10-CM | POA: Diagnosis not present

## 2016-07-05 DIAGNOSIS — G43009 Migraine without aura, not intractable, without status migrainosus: Secondary | ICD-10-CM | POA: Diagnosis not present

## 2016-07-05 DIAGNOSIS — F411 Generalized anxiety disorder: Secondary | ICD-10-CM | POA: Diagnosis not present

## 2016-07-05 DIAGNOSIS — R269 Unspecified abnormalities of gait and mobility: Secondary | ICD-10-CM | POA: Diagnosis not present

## 2016-07-05 DIAGNOSIS — F445 Conversion disorder with seizures or convulsions: Secondary | ICD-10-CM | POA: Diagnosis not present

## 2016-07-05 DIAGNOSIS — J4530 Mild persistent asthma, uncomplicated: Secondary | ICD-10-CM | POA: Diagnosis not present

## 2016-07-05 DIAGNOSIS — G4733 Obstructive sleep apnea (adult) (pediatric): Secondary | ICD-10-CM | POA: Diagnosis not present

## 2016-07-05 DIAGNOSIS — F17210 Nicotine dependence, cigarettes, uncomplicated: Secondary | ICD-10-CM | POA: Diagnosis not present

## 2016-07-09 DIAGNOSIS — G2581 Restless legs syndrome: Secondary | ICD-10-CM | POA: Diagnosis not present

## 2016-07-09 DIAGNOSIS — J189 Pneumonia, unspecified organism: Secondary | ICD-10-CM | POA: Diagnosis not present

## 2016-07-09 DIAGNOSIS — G4733 Obstructive sleep apnea (adult) (pediatric): Secondary | ICD-10-CM | POA: Diagnosis not present

## 2016-07-09 DIAGNOSIS — R918 Other nonspecific abnormal finding of lung field: Secondary | ICD-10-CM | POA: Diagnosis not present

## 2016-07-09 DIAGNOSIS — J4530 Mild persistent asthma, uncomplicated: Secondary | ICD-10-CM | POA: Diagnosis not present

## 2016-07-12 DIAGNOSIS — E119 Type 2 diabetes mellitus without complications: Secondary | ICD-10-CM | POA: Diagnosis not present

## 2016-07-14 DIAGNOSIS — D849 Immunodeficiency, unspecified: Secondary | ICD-10-CM | POA: Diagnosis not present

## 2016-07-14 DIAGNOSIS — A09 Infectious gastroenteritis and colitis, unspecified: Secondary | ICD-10-CM | POA: Diagnosis not present

## 2016-07-14 DIAGNOSIS — E119 Type 2 diabetes mellitus without complications: Secondary | ICD-10-CM | POA: Diagnosis not present

## 2016-07-14 DIAGNOSIS — Z23 Encounter for immunization: Secondary | ICD-10-CM | POA: Diagnosis not present

## 2016-07-16 DIAGNOSIS — M545 Low back pain: Secondary | ICD-10-CM | POA: Diagnosis not present

## 2016-07-16 DIAGNOSIS — G894 Chronic pain syndrome: Secondary | ICD-10-CM | POA: Diagnosis not present

## 2016-07-16 DIAGNOSIS — M4712 Other spondylosis with myelopathy, cervical region: Secondary | ICD-10-CM | POA: Diagnosis not present

## 2016-07-20 ENCOUNTER — Ambulatory Visit (INDEPENDENT_AMBULATORY_CARE_PROVIDER_SITE_OTHER): Payer: Medicare Other | Admitting: Allergy

## 2016-07-20 ENCOUNTER — Encounter: Payer: Self-pay | Admitting: Allergy

## 2016-07-20 VITALS — BP 148/88 | HR 80 | Temp 98.8°F | Resp 18 | Ht 64.88 in | Wt 208.0 lb

## 2016-07-20 DIAGNOSIS — D849 Immunodeficiency, unspecified: Secondary | ICD-10-CM | POA: Diagnosis not present

## 2016-07-20 DIAGNOSIS — J449 Chronic obstructive pulmonary disease, unspecified: Secondary | ICD-10-CM | POA: Diagnosis not present

## 2016-07-20 DIAGNOSIS — J4489 Other specified chronic obstructive pulmonary disease: Secondary | ICD-10-CM

## 2016-07-20 DIAGNOSIS — B999 Unspecified infectious disease: Secondary | ICD-10-CM

## 2016-07-20 HISTORY — DX: Chronic obstructive pulmonary disease, unspecified: J44.9

## 2016-07-20 HISTORY — DX: Other specified chronic obstructive pulmonary disease: J44.89

## 2016-07-20 NOTE — Patient Instructions (Addendum)
Recurrent infections,  Possible immunodeficiency     - will obtain additional labs to work-up your immune system as follows: CBC w diff, CMP, immunoglobulins (A,G, M), vaccine titers, ESR, CRP, Ch50    - we have requested labs/records from Dr. Chodri's office    - if your IgG is low then immunoglobulin replacement would be the treatment of choice either by IV monthly infusions or weekly-biweekly subcutaneous infusions.  We will discuss these options further once your labs return.      - Please have your labs redrawn no sooner than 6 weeks from 07/14/16 to assess how your body has responded to the Prevnar vaccine.     www.primaryimmune.org is a reputable site on immunodeficiencies that you can find additional information.    

## 2016-07-20 NOTE — Progress Notes (Signed)
New Patient Note  RE: Kathy Moss MRN: [id number] DOB: [birth year] Date of Office Visit: 07/20/2016  Referring provider: Bess Harvest* Primary care provider: Gilford Rile, MD   Chief Complaint: Immune system problem  History of present illness: Kathy Moss is a 55 y.o. female presenting today for consultation for concern for immunodeficiency.  She presents today with her wife who provides most of her history.   She follows with Dr. Alcide Clever and pulmonary for her asthma and COPD.  She had pneumonia confirmed on chest x-ray in October that required 4 day hospitalization at [facility] where she received IV antibiotics. This was her first ever pneumonia. Since that time wife reports that she has been sick at least be more times with fever, cough and wheezing and sore throat and generalized fatigue. She has had 2 antibiotic courses as well as a prednisone course since her hospitalization. She has also had thrush requiring swish and swallow antifungal.  Her wife believe she has had 5-6 antibiotic courses each year. She does get ear infections as an adult at least once a year. She has not had any other infections requiring hospitalization or IV antibiotics. She has had no opportunistic infections or skin infections. She reports she is up-to-date with her vaccines he received a Prevnar on 07/14/2016.   Earlier this month she also had episode of diarrhea.    Dr. Alcide Clever per the patient and patient's wife did lab work assessing her immune system and they believe that results showed a low IgG and a high IgE.  These labs are not available at time of visit and have been requested.  Patient reports that they discussed infusions but they still have a lot of questions regarding her diagnosis and treatment plan.     She continues to smoke however wife reports that she has cut back. She is on Breo currently and Spiriva.      For her allergy symptoms she uses flonase and astelin daily.  She has a  lot of nasal congestion and drainage, itchy watery eyes and sneezing.  Symptoms are worse in spring and summer but are year round.   Wife reports Dr. Bernette Mayers did allergy skin testing earlier this year and was told she was positive to grass, tree.    Review of systems: Review of Systems  Constitutional: Positive for malaise/fatigue. Negative for chills and fever.  HENT: Positive for congestion and sore throat. Negative for nosebleeds and sinus pain.   Eyes: Positive for discharge. Negative for redness.  Respiratory: Positive for cough, shortness of breath and wheezing.   Cardiovascular: Positive for chest pain.  Gastrointestinal: Negative for heartburn, nausea and vomiting.  Skin: Negative for itching and rash.    All other systems negative unless noted above in HPI  Past medical history: Past Medical History:  Diagnosis Date  . Angina at rest [facility]) 2013  . Asthma   . Hypertension   . Recurrent upper respiratory infection (URI)   . Seizures ([place])     Past surgical history: Past Surgical History:  Procedure Laterality Date  . APPENDECTOMY    . BACK SURGERY    . CHOLECYSTECTOMY    . HERNIA REPAIR      Family history:  Family History  Problem Relation Age of Onset  . High blood pressure Mother   . Lung cancer Mother   . Seizures Mother   . Stroke Mother   . Asthma Mother   . Allergic rhinitis Brother   . COPD  Brother     Social history:  Social History Main Topics  . Smoking status: Current Every Day Smoker    Packs/day: 1.00    Years: 36.00    Types: Cigarettes  . Smokeless tobacco: Not on file  . Alcohol use No  . Drug use: Unknown  . Sexual activity: Not on file   Social History Narrative  . She lives with her wife in a trailer with carpeting with propane heating and window cooling. There are dogs in the home. There are no concerns for roaches in the home. She is disabled and does not work.     Medication List:   Medication List       Accurate as of  07/20/16  4:23 PM. Always use your most recent med list.          albuterol 108 (90 Base) MCG/ACT inhaler Commonly known as:  PROVENTIL HFA;VENTOLIN HFA Inhale 1-2 puffs into the lungs every 6 (six) hours as needed for wheezing or shortness of breath.   ALPRAZolam 0.5 MG tablet Commonly known as:  XANAX Take 0.5 mg by mouth 3 (three) times daily as needed for anxiety.   aspirin EC 81 MG tablet Take 81 mg by mouth daily.   azelastine 0.1 % nasal spray Commonly known as:  ASTELIN Place 1 spray into both nostrils 2 (two) times daily. Use in each nostril as directed   beclomethasone 80 MCG/ACT inhaler Commonly known as:  QVAR Inhale 1 puff into the lungs 2 (two) times daily.   BREO ELLIPTA 200-25 MCG/INH Aepb Generic drug:  fluticasone furoate-vilanterol Inhale 1 puff into the lungs 2 (two) times daily.   butalbital-acetaminophen-caffeine 50-325-40 MG tablet Commonly known as:  FIORICET, ESGIC TAKE 1 TABLET BY MOUTH EVERY 6 HOURS AS NEEDED FOR PAIN.   diltiazem 240 MG 24 hr capsule Commonly known as:  DILACOR XR Take 240 mg by mouth daily.   diltiazem 240 MG 24 hr capsule Commonly known as:  CARDIZEM CD Take 240 mg by mouth.   esomeprazole 40 MG capsule Commonly known as:  NEXIUM Take 40 mg by mouth 3 (three) times daily.   eszopiclone 3 MG Tabs Generic drug:  Eszopiclone Take 3 mg by mouth at bedtime. Take immediately before bedtime   FISH OIL PO Take 1,000 mg by mouth 4 (four) times daily.   fluticasone 50 MCG/ACT nasal spray Commonly known as:  FLONASE Place 1 spray into both nostrils daily.   gabapentin 300 MG capsule Commonly known as:  NEURONTIN Take 300 mg by mouth 3 (three) times daily.   lacosamide 200 MG Tabs tablet Commonly known as:  VIMPAT Take 200 mg by mouth 2 (two) times daily.   lamoTRIgine 25 MG tablet Commonly known as:  LAMICTAL Take 25 mg by mouth 2 (two) times daily.   levETIRAcetam 500 MG tablet Commonly known as:  KEPPRA Take  500 mg by mouth 2 (two) times daily.   nitroGLYCERIN 0.4 MG SL tablet Commonly known as:  NITROSTAT Place 0.4 mg under the tongue every 5 (five) minutes as needed for chest pain.   NORCO PO Take 1 tablet by mouth every 4 (four) hours as needed.   nystatin 100000 UNIT/ML suspension Commonly known as:  MYCOSTATIN Take 5 mLs by mouth 3 (three) times daily.   olmesartan 20 MG tablet Commonly known as:  BENICAR Take 20 mg by mouth daily.   promethazine 25 MG tablet Commonly known as:  PHENERGAN Take 25 mg by mouth every 6 (six)  hours as needed for nausea or vomiting.   ranitidine 300 MG tablet Commonly known as:  ZANTAC Take 300 mg by mouth at bedtime.   ranolazine 500 MG 12 hr tablet Commonly known as:  RANEXA Take 500 mg by mouth 2 (two) times daily.   rOPINIRole 1 MG tablet Commonly known as:  REQUIP Take 3 mg by mouth at bedtime.   rosuvastatin 5 MG tablet Commonly known as:  CRESTOR Take 5 mg by mouth daily at 6 PM.   sitaGLIPtin 25 MG tablet Commonly known as:  JANUVIA Take 25 mg by mouth 2 (two) times daily.   tiotropium 18 MCG inhalation capsule Commonly known as:  SPIRIVA Place 18 mcg into inhaler and inhale daily.   tiZANidine 4 MG capsule Commonly known as:  ZANAFLEX Take 4 mg by mouth 3 (three) times daily.   Vitamin D (Ergocalciferol) 50000 units Caps capsule Commonly known as:  DRISDOL Take 50,000 Units by mouth every Tuesday.       Known medication allergies: Allergies  Allergen Reactions  . Imitrex [Sumatriptan] Palpitations  . Morphine Itching  . Tramadol Palpitations  . Adhesive [Tape] Other (See Comments)    MEDICAL TAPE CAUSES BRUISES AND TEARS SKIN,  PAPER TAPE IS OK  . Amitriptyline Palpitations  . Buprenorphine Hcl Itching  . Morphine And Related Itching  . Sumatriptan Succinate Palpitations  . Tramadol-Acetaminophen Palpitations     Physical examination: Blood pressure (!) 148/88, pulse 80, temperature 98.8 F (37.1 C),  temperature source Tympanic, resp. rate 18, height 5' 4.88" (1.648 m), weight 208 lb (94.3 kg).  General: Alert, interactive, in no acute distress. HEENT: TMs pearly gray, turbinates minimally edematous without discharge, post-pharynx non erythematous. Neck: Supple without lymphadenopathy. Lungs: Mildly decreased breath sounds bilaterally without wheezing, rhonchi or rales. {no increased work of breathing.  Several coughing episodes throughout encounter  CV: Normal S1, S2 without murmurs. Abdomen: Nondistended, nontender. Skin: Warm and dry, without lesions or rashes. Extremities:  No clubbing, cyanosis or edema. Neuro:   Grossly intact.  Diagnositics/Labs: Labs:  CBC (06/16/2016 3:29 PM) CBC (06/16/2016 3:29 PM)  Component Value Ref Range  WBC 11.0 (H) 3.4 - 10.8 x10E3/uL  RBC 4.24 3.77 - 5.28 x10E6/uL  HGB 13.9 11.1 - 15.9 g/dL  HCT 41.6 34.0 - 46.6 %  MCV 98 (H) 79 - 97 fL  MCH 32.8 26.6 - 33.0 pg  MCHC 33.4 31.5 - 35.7 g/dL  RDW 15.0 12.3 - 15.4 %  Platelet 263 150 - 379 [id number]    Basic Metabolic Panel (37/85/8850 3:29 PM) Basic Metabolic Panel (27/74/1287 3:29 PM)  Component Value Ref Range  Glucose 119 (H) 65 - 99 mg/dL  BUN 15 6 - 24 mg/dL  Creatinine 1.02 (H) 0.57 - 1.00 mg/dL  GFR MDRD Non Af Amer 63 >59 mL/min/1.73  GFR MDRD Af Amer 73 >59 mL/min/1.73  BUN/Creatinine Ratio 15 9 - 23  Sodium 139 134 - 144 mmol/L  Potassium 4.5 3.5 - 5.2 mmol/L  Chloride 99 96 - 106 mmol/L  CO2 22 18 - 29 mmol/L  Calcium 8.6 (L) 8.7 - 10.2 mg/dL    03/05/16  CRP 9.5  ESR 22  Spirometry: FEV1: 1.4L  49%, FVC: 1.76L  49%, ratio consistent with Severe restriction  Assessment and plan:   Recurrent infections,  Possible immunodeficiency    - Patient has been told by her pulmonologist that she has an immunodeficiency. As labs unable to be sent while the patient was in the office today we'll  await transfer of lab results from Dr. Maxie Barb office.  Given her history of recurrent  upper respiratory tract infections including ear infections as an adult would be concerned about humoral cell defects in her immune system.  It is also notable that diarrhea can be a source of GI loss for immunoglobulins. We will obtain the following labs as below:    - CBC w diff, CMP, immunoglobulins (A,G, M), vaccine titers, ESR, CRP, Ch50    - we have requested labs/records from Dr. 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  Discussed this treatment plan with the patient today per their request with either IV infusions monthly or subcutaneous injections every week to every 2 weeks. We'll plan to further discuss these options once the labs return.     - She did have Prevnar earlier this month and provided her with a lab slip to have her immune response to Prevnar checked no sooner than 6 weeks from her vaccine date to ensure that she has made an appropriate response.   Follow-up as needed pending lab results   I appreciate the opportunity to take part in [place] care. Please do not hesitate to contact me with questions.  Sincerely,   Prudy Feeler, MD Allergy/Immunology [facility] [place] [facility] [place]

## 2016-07-26 DIAGNOSIS — Z79899 Other long term (current) drug therapy: Secondary | ICD-10-CM | POA: Diagnosis not present

## 2016-07-26 DIAGNOSIS — Z886 Allergy status to analgesic agent status: Secondary | ICD-10-CM | POA: Diagnosis not present

## 2016-07-26 DIAGNOSIS — F17210 Nicotine dependence, cigarettes, uncomplicated: Secondary | ICD-10-CM | POA: Diagnosis not present

## 2016-07-26 DIAGNOSIS — Z885 Allergy status to narcotic agent status: Secondary | ICD-10-CM | POA: Diagnosis not present

## 2016-07-26 DIAGNOSIS — Z7951 Long term (current) use of inhaled steroids: Secondary | ICD-10-CM | POA: Diagnosis not present

## 2016-07-26 DIAGNOSIS — I10 Essential (primary) hypertension: Secondary | ICD-10-CM | POA: Diagnosis not present

## 2016-07-26 DIAGNOSIS — M5412 Radiculopathy, cervical region: Secondary | ICD-10-CM | POA: Diagnosis not present

## 2016-07-26 DIAGNOSIS — J45909 Unspecified asthma, uncomplicated: Secondary | ICD-10-CM | POA: Diagnosis not present

## 2016-07-26 DIAGNOSIS — Z888 Allergy status to other drugs, medicaments and biological substances status: Secondary | ICD-10-CM | POA: Diagnosis not present

## 2016-07-26 DIAGNOSIS — M7552 Bursitis of left shoulder: Secondary | ICD-10-CM | POA: Diagnosis not present

## 2016-07-26 DIAGNOSIS — M47812 Spondylosis without myelopathy or radiculopathy, cervical region: Secondary | ICD-10-CM | POA: Diagnosis not present

## 2016-07-29 LAB — COMPREHENSIVE METABOLIC PANEL
ALT: 8 IU/L (ref 0–32)
AST: 10 IU/L (ref 0–40)
Albumin/Globulin Ratio: 2.1 (ref 1.2–2.2)
Albumin: 4.5 g/dL (ref 3.5–5.5)
Alkaline Phosphatase: 102 IU/L (ref 39–117)
BUN/Creatinine Ratio: 13 (ref 9–23)
BUN: 12 mg/dL (ref 6–24)
Bilirubin Total: 0.2 mg/dL (ref 0.0–1.2)
CO2: 24 mmol/L (ref 18–29)
Calcium: 9.6 mg/dL (ref 8.7–10.2)
Chloride: 97 mmol/L (ref 96–106)
Creatinine, Ser: 0.96 mg/dL (ref 0.57–1.00)
GFR calc Af Amer: 78 mL/min/{1.73_m2} (ref >59)
GFR calc non Af Amer: 68 mL/min/{1.73_m2} (ref >59)
Globulin, Total: 2.1 g/dL (ref 1.5–4.5)
Glucose: 91 mg/dL (ref 65–99)
Potassium: 4.4 mmol/L (ref 3.5–5.2)
Sodium: 137 mmol/L (ref 134–144)
Total Protein: 6.6 g/dL (ref 6.0–8.5)

## 2016-07-29 LAB — PNEUMOCOCCAL IM (14 SEROTYPE)
Pneumo Ab Type 1*: 0.5 ug/mL — ABNORMAL LOW (ref >1.3)
Pneumo Ab Type 12 (12F)*: <0.1 ug/mL — ABNORMAL LOW (ref >1.3)
Pneumo Ab Type 14*: 5.7 ug/mL (ref >1.3)
Pneumo Ab Type 19 (19F)*: 2 ug/mL (ref >1.3)
Pneumo Ab Type 23 (23F)*: 0.3 ug/mL — ABNORMAL LOW (ref >1.3)
Pneumo Ab Type 26 (6B)*: 0.6 ug/mL — ABNORMAL LOW (ref >1.3)
Pneumo Ab Type 3*: 0.8 ug/mL — ABNORMAL LOW (ref >1.3)
Pneumo Ab Type 4*: 0.2 ug/mL — ABNORMAL LOW (ref >1.3)
Pneumo Ab Type 51 (7F)*: 2.4 ug/mL (ref >1.3)
Pneumo Ab Type 56 (18C)*: <0.1 ug/mL — ABNORMAL LOW (ref >1.3)
Pneumo Ab Type 57 (19A)*: 4.6 ug/mL (ref >1.3)
Pneumo Ab Type 68 (9V)*: 0.1 ug/mL — ABNORMAL LOW (ref >1.3)
Pneumo Ab Type 8*: 1.4 ug/mL (ref >1.3)
Pneumo Ab Type 9 (9N)*: 0.5 ug/mL — ABNORMAL LOW (ref >1.3)

## 2016-07-29 LAB — CBC WITH DIFFERENTIAL/PLATELET
Basophils Absolute: 0 10*3/uL (ref 0.0–0.2)
Basos: 0 %
EOS (ABSOLUTE): 0.1 10*3/uL (ref 0.0–0.4)
Eos: 1 %
Hematocrit: 39.9 % (ref 34.0–46.6)
Hemoglobin: 13.9 g/dL (ref 11.1–15.9)
Immature Grans (Abs): 0.1 10*3/uL (ref 0.0–0.1)
Immature Granulocytes: 1 %
Lymphocytes Absolute: 2.9 10*3/uL (ref 0.7–3.1)
Lymphs: 23 %
MCH: 33.5 pg — ABNORMAL HIGH (ref 26.6–33.0)
MCHC: 34.8 g/dL (ref 31.5–35.7)
MCV: 96 fL (ref 79–97)
Monocytes Absolute: 0.8 10*3/uL (ref 0.1–0.9)
Monocytes: 6 %
Neutrophils Absolute: 9 10*3/uL — ABNORMAL HIGH (ref 1.4–7.0)
Neutrophils: 69 %
Platelets: 287 10*3/uL (ref 150–379)
RBC: 4.15 x10E6/uL (ref 3.77–5.28)
RDW: 15.2 % (ref 12.3–15.4)
WBC: 13 10*3/uL — ABNORMAL HIGH (ref 3.4–10.8)

## 2016-07-29 LAB — ALLERGENS W/TOTAL IGE AREA 2
Alternaria Alternata IgE: <0.1 kU/L
Aspergillus Fumigatus IgE: <0.1 kU/L
Bermuda Grass IgE: <0.1 kU/L
Cat Dander IgE: <0.1 kU/L
Cedar, Mountain IgE: <0.1 kU/L
Cladosporium Herbarum IgE: <0.1 kU/L
Cockroach, German IgE: 0.38 kU/L — AB
Common Silver Birch IgE: <0.1 kU/L
Cottonwood IgE: <0.1 kU/L
D Farinae IgE: <0.1 kU/L
D Pteronyssinus IgE: <0.1 kU/L
Dog Dander IgE: <0.1 kU/L
Elm, American IgE: <0.1 kU/L
IgE (Immunoglobulin E), Serum: 374 IU/mL — ABNORMAL HIGH (ref 0–100)
Johnson Grass IgE: <0.1 kU/L
Maple/Box Elder IgE: <0.1 kU/L
Mouse Urine IgE: <0.1 kU/L
Oak, White IgE: <0.1 kU/L
Pecan, Hickory IgE: <0.1 kU/L
Penicillium Chrysogen IgE: <0.1 kU/L
Pigweed, Rough IgE: <0.1 kU/L
Ragweed, Short IgE: <0.1 kU/L
Sheep Sorrel IgE Qn: <0.1 kU/L
Timothy Grass IgE: <0.1 kU/L
White Mulberry IgE: <0.1 kU/L

## 2016-07-29 LAB — IGG, IGA, IGM
IgA/Immunoglobulin A, Serum: 141 mg/dL (ref 87–352)
IgG (Immunoglobin G), Serum: 468 mg/dL — ABNORMAL LOW (ref 700–1600)
IgM (Immunoglobulin M), Srm: 39 mg/dL (ref 26–217)

## 2016-07-29 LAB — TETANUS ANTIBODY, IGG: Tetanus Ab, IgG: 0.84 IU/mL (ref <0.10)

## 2016-07-29 LAB — DIPHTHERIA ANTITOXOID AB: Diphtheria Ab: <0.1 IU/mL — ABNORMAL LOW (ref <0.10)

## 2016-07-29 LAB — SEDIMENTATION RATE: Sed Rate: 6 mm/hr (ref 0–40)

## 2016-07-29 LAB — COMPLEMENT, TOTAL: Compl, Total (CH50): >60 U/mL — ABNORMAL HIGH (ref 42–60)

## 2016-07-29 LAB — C-REACTIVE PROTEIN: CRP: 8.2 mg/L — ABNORMAL HIGH (ref 0.0–4.9)

## 2016-08-03 DIAGNOSIS — E119 Type 2 diabetes mellitus without complications: Secondary | ICD-10-CM | POA: Diagnosis not present

## 2016-08-04 DIAGNOSIS — I10 Essential (primary) hypertension: Secondary | ICD-10-CM | POA: Diagnosis not present

## 2016-08-04 DIAGNOSIS — F17210 Nicotine dependence, cigarettes, uncomplicated: Secondary | ICD-10-CM | POA: Diagnosis not present

## 2016-08-04 DIAGNOSIS — Z79899 Other long term (current) drug therapy: Secondary | ICD-10-CM | POA: Diagnosis not present

## 2016-08-04 DIAGNOSIS — S59902A Unspecified injury of left elbow, initial encounter: Secondary | ICD-10-CM | POA: Diagnosis not present

## 2016-08-04 DIAGNOSIS — S3992XA Unspecified injury of lower back, initial encounter: Secondary | ICD-10-CM | POA: Diagnosis not present

## 2016-08-04 DIAGNOSIS — J449 Chronic obstructive pulmonary disease, unspecified: Secondary | ICD-10-CM | POA: Diagnosis not present

## 2016-08-04 DIAGNOSIS — Z7982 Long term (current) use of aspirin: Secondary | ICD-10-CM | POA: Diagnosis not present

## 2016-08-04 DIAGNOSIS — M25561 Pain in right knee: Secondary | ICD-10-CM | POA: Diagnosis not present

## 2016-08-04 DIAGNOSIS — M545 Low back pain: Secondary | ICD-10-CM | POA: Diagnosis not present

## 2016-08-04 DIAGNOSIS — I4891 Unspecified atrial fibrillation: Secondary | ICD-10-CM | POA: Diagnosis not present

## 2016-08-04 DIAGNOSIS — M25522 Pain in left elbow: Secondary | ICD-10-CM | POA: Diagnosis not present

## 2016-08-04 DIAGNOSIS — Z9181 History of falling: Secondary | ICD-10-CM | POA: Diagnosis not present

## 2016-08-04 DIAGNOSIS — S8991XA Unspecified injury of right lower leg, initial encounter: Secondary | ICD-10-CM | POA: Diagnosis not present

## 2016-08-05 DIAGNOSIS — E119 Type 2 diabetes mellitus without complications: Secondary | ICD-10-CM | POA: Diagnosis not present

## 2016-08-16 ENCOUNTER — Other Ambulatory Visit: Payer: Self-pay | Admitting: Allergy and Immunology

## 2016-08-16 DIAGNOSIS — G894 Chronic pain syndrome: Secondary | ICD-10-CM | POA: Diagnosis not present

## 2016-08-16 DIAGNOSIS — S99921A Unspecified injury of right foot, initial encounter: Secondary | ICD-10-CM | POA: Diagnosis not present

## 2016-08-16 DIAGNOSIS — M4716 Other spondylosis with myelopathy, lumbar region: Secondary | ICD-10-CM | POA: Diagnosis not present

## 2016-08-16 DIAGNOSIS — M4326 Fusion of spine, lumbar region: Secondary | ICD-10-CM | POA: Diagnosis not present

## 2016-08-16 DIAGNOSIS — E119 Type 2 diabetes mellitus without complications: Secondary | ICD-10-CM | POA: Diagnosis not present

## 2016-08-16 DIAGNOSIS — M4800 Spinal stenosis, site unspecified: Secondary | ICD-10-CM | POA: Diagnosis not present

## 2016-08-16 DIAGNOSIS — M545 Low back pain: Secondary | ICD-10-CM | POA: Diagnosis not present

## 2016-08-16 DIAGNOSIS — D849 Immunodeficiency, unspecified: Secondary | ICD-10-CM | POA: Diagnosis not present

## 2016-08-16 DIAGNOSIS — R1084 Generalized abdominal pain: Secondary | ICD-10-CM | POA: Diagnosis not present

## 2016-08-16 DIAGNOSIS — M79671 Pain in right foot: Secondary | ICD-10-CM | POA: Diagnosis not present

## 2016-08-16 DIAGNOSIS — M25561 Pain in right knee: Secondary | ICD-10-CM | POA: Diagnosis not present

## 2016-08-16 DIAGNOSIS — M25571 Pain in right ankle and joints of right foot: Secondary | ICD-10-CM | POA: Diagnosis not present

## 2016-08-16 DIAGNOSIS — S99911A Unspecified injury of right ankle, initial encounter: Secondary | ICD-10-CM | POA: Diagnosis not present

## 2016-08-19 DIAGNOSIS — R1084 Generalized abdominal pain: Secondary | ICD-10-CM | POA: Diagnosis not present

## 2016-08-19 DIAGNOSIS — I700 Atherosclerosis of aorta: Secondary | ICD-10-CM | POA: Diagnosis not present

## 2016-08-19 DIAGNOSIS — R1010 Upper abdominal pain, unspecified: Secondary | ICD-10-CM | POA: Diagnosis not present

## 2016-08-19 DIAGNOSIS — K869 Disease of pancreas, unspecified: Secondary | ICD-10-CM | POA: Diagnosis not present

## 2016-08-20 LAB — PNEUMOCOCCAL IM (14 SEROTYPE)
Pneumo Ab Type 1*: 0.4 ug/mL — ABNORMAL LOW (ref >1.3)
Pneumo Ab Type 12 (12F)*: <0.1 ug/mL — ABNORMAL LOW (ref >1.3)
Pneumo Ab Type 14*: 5.2 ug/mL (ref >1.3)
Pneumo Ab Type 19 (19F)*: 2.5 ug/mL (ref >1.3)
Pneumo Ab Type 23 (23F)*: 0.3 ug/mL — ABNORMAL LOW (ref >1.3)
Pneumo Ab Type 26 (6B)*: 0.5 ug/mL — ABNORMAL LOW (ref >1.3)
Pneumo Ab Type 3*: 0.7 ug/mL — ABNORMAL LOW (ref >1.3)
Pneumo Ab Type 4*: 0.2 ug/mL — ABNORMAL LOW (ref >1.3)
Pneumo Ab Type 51 (7F)*: 2.1 ug/mL (ref >1.3)
Pneumo Ab Type 56 (18C)*: <0.1 ug/mL — ABNORMAL LOW (ref >1.3)
Pneumo Ab Type 57 (19A)*: 4 ug/mL (ref >1.3)
Pneumo Ab Type 68 (9V)*: 0.1 ug/mL — ABNORMAL LOW (ref >1.3)
Pneumo Ab Type 8*: 1.2 ug/mL — ABNORMAL LOW (ref >1.3)
Pneumo Ab Type 9 (9N)*: 0.5 ug/mL — ABNORMAL LOW (ref >1.3)

## 2016-08-25 DIAGNOSIS — M79604 Pain in right leg: Secondary | ICD-10-CM | POA: Diagnosis not present

## 2016-08-25 DIAGNOSIS — Q742 Other congenital malformations of lower limb(s), including pelvic girdle: Secondary | ICD-10-CM | POA: Diagnosis not present

## 2016-09-01 ENCOUNTER — Encounter: Payer: Self-pay | Admitting: Allergy

## 2016-09-01 ENCOUNTER — Ambulatory Visit (INDEPENDENT_AMBULATORY_CARE_PROVIDER_SITE_OTHER): Payer: Medicare Other | Admitting: Allergy

## 2016-09-01 VITALS — BP 142/84 | HR 72 | Resp 18 | Wt 203.0 lb

## 2016-09-01 DIAGNOSIS — J449 Chronic obstructive pulmonary disease, unspecified: Secondary | ICD-10-CM | POA: Diagnosis not present

## 2016-09-01 DIAGNOSIS — D839 Common variable immunodeficiency, unspecified: Secondary | ICD-10-CM

## 2016-09-01 NOTE — Progress Notes (Signed)
Follow-up Note  RE: Kathy Moss MRN: [id number] DOB: 1960/10/12 Date of Office Visit: 09/01/2016   History of present illness: Kathy Moss is a 56 y.o. female presenting today for to discuss recent lab results for immunodeficiency diagnosis.   She has history of recurrent sinopulmonary infections and has been hospitalized requiring IV antibiotics.  She had initial lab work showing low IgG levels.  She has under protected titers to strep pneumo and no protection to diphtheria.  She had pneumovax given thru PCP and had titers repeated which showed no response.  She returns today to discuss starting on IgG replacement therapy.   Since last visit 07/20/16 she has not required any further antibiotics.  She does continue to report chronic fatigue.       Review of systems: Review of Systems  Constitutional: Positive for malaise/fatigue. Negative for chills and fever.  HENT: Positive for congestion. Negative for ear discharge, ear pain, sinus pain and sore throat.   Eyes: Negative for discharge and redness.  Respiratory: Positive for cough and shortness of breath. Negative for wheezing.   Cardiovascular: Negative for chest pain.  Gastrointestinal: Negative for heartburn, nausea and vomiting.  Skin: Negative for itching and rash.    All other systems negative unless noted above in HPI  Past medical/social/surgical/family history have been reviewed and are unchanged unless specifically indicated below.  No changes  Medication List: Allergies as of 09/01/2016      Reactions   Imitrex [sumatriptan] Palpitations   Morphine Itching   Tramadol Palpitations   Adhesive [tape] Other (See Comments)   MEDICAL TAPE CAUSES BRUISES AND TEARS SKIN,  PAPER TAPE IS OK   Amitriptyline Palpitations   Buprenorphine Hcl Itching   Morphine And Related Itching   Sumatriptan Succinate Palpitations   Tramadol-acetaminophen Palpitations      Medication List       Accurate as of 09/01/16  1:53 PM.  Always use your most recent med list.          albuterol 108 (90 Base) MCG/ACT inhaler Commonly known as:  PROVENTIL HFA;VENTOLIN HFA Inhale 1-2 puffs into the lungs every 6 (six) hours as needed for wheezing or shortness of breath.   ALPRAZolam 0.5 MG tablet Commonly known as:  XANAX Take 0.5 mg by mouth 3 (three) times daily as needed for anxiety.   aspirin EC 81 MG tablet Take 81 mg by mouth daily.   azelastine 0.1 % nasal spray Commonly known as:  ASTELIN Place 1 spray into both nostrils 2 (two) times daily. Use in each nostril as directed   beclomethasone 80 MCG/ACT inhaler Commonly known as:  QVAR Inhale 1 puff into the lungs 2 (two) times daily.   BREO ELLIPTA 200-25 MCG/INH Aepb Generic drug:  fluticasone furoate-vilanterol Inhale 1 puff into the lungs 2 (two) times daily.   butalbital-acetaminophen-caffeine 50-325-40 MG tablet Commonly known as:  FIORICET, ESGIC TAKE 1 TABLET BY MOUTH EVERY 6 HOURS AS NEEDED FOR PAIN.   diltiazem 240 MG 24 hr capsule Commonly known as:  DILACOR XR Take 240 mg by mouth daily.   diltiazem 240 MG 24 hr capsule Commonly known as:  CARDIZEM CD Take 240 mg by mouth.   esomeprazole 40 MG capsule Commonly known as:  NEXIUM Take 40 mg by mouth 3 (three) times daily.   eszopiclone 3 MG Tabs Generic drug:  Eszopiclone Take 3 mg by mouth at bedtime. Take immediately before bedtime   FISH OIL PO Take 1,000 mg by mouth  4 (four) times daily.   fluticasone 50 MCG/ACT nasal spray Commonly known as:  FLONASE Place 1 spray into both nostrils daily.   gabapentin 300 MG capsule Commonly known as:  NEURONTIN Take 300 mg by mouth 3 (three) times daily.   lacosamide 200 MG Tabs tablet Commonly known as:  VIMPAT Take 200 mg by mouth 2 (two) times daily.   lamoTRIgine 25 MG tablet Commonly known as:  LAMICTAL Take 25 mg by mouth 2 (two) times daily.   levETIRAcetam 500 MG tablet Commonly known as:  KEPPRA Take 500 mg by mouth 2  (two) times daily.   nitroGLYCERIN 0.4 MG SL tablet Commonly known as:  NITROSTAT Place 0.4 mg under the tongue every 5 (five) minutes as needed for chest pain.   NORCO PO Take 1 tablet by mouth every 4 (four) hours as needed.   nystatin 100000 UNIT/ML suspension Commonly known as:  MYCOSTATIN Take 5 mLs by mouth 3 (three) times daily.   olmesartan 20 MG tablet Commonly known as:  BENICAR Take 20 mg by mouth daily.   promethazine 25 MG tablet Commonly known as:  PHENERGAN Take 25 mg by mouth every 6 (six) hours as needed for nausea or vomiting.   ranitidine 300 MG tablet Commonly known as:  ZANTAC Take 300 mg by mouth at bedtime.   ranolazine 500 MG 12 hr tablet Commonly known as:  RANEXA Take 500 mg by mouth 2 (two) times daily.   rOPINIRole 1 MG tablet Commonly known as:  REQUIP Take 3 mg by mouth at bedtime.   rosuvastatin 5 MG tablet Commonly known as:  CRESTOR Take 5 mg by mouth daily at 6 PM.   sitaGLIPtin 25 MG tablet Commonly known as:  JANUVIA Take 25 mg by mouth 2 (two) times daily.   tiotropium 18 MCG inhalation capsule Commonly known as:  SPIRIVA Place 18 mcg into inhaler and inhale daily.   tiZANidine 4 MG capsule Commonly known as:  ZANAFLEX Take 4 mg by mouth 3 (three) times daily.   Vitamin D (Ergocalciferol) 50000 units Caps capsule Commonly known as:  DRISDOL Take 50,000 Units by mouth every Tuesday.       Known medication allergies: Allergies  Allergen Reactions  . Imitrex [Sumatriptan] Palpitations  . Morphine Itching  . Tramadol Palpitations  . Adhesive [Tape] Other (See Comments)    MEDICAL TAPE CAUSES BRUISES AND TEARS SKIN,  PAPER TAPE IS OK  . Amitriptyline Palpitations  . Buprenorphine Hcl Itching  . Morphine And Related Itching  . Sumatriptan Succinate Palpitations  . Tramadol-Acetaminophen Palpitations     Physical examination: Blood pressure (!) 142/84, pulse 72, resp. rate 18, weight 203 lb (92.1 kg).  General:  Alert, interactive, in no acute distress. HEENT: TMs pearly gray, turbinates mildly edematous without discharge, post-pharynx non erythematous. Neck: Supple without lymphadenopathy. Lungs: Clear to auscultation without wheezing, rhonchi or rales. {no increased work of breathing. CV: Normal S1, S2 without murmurs. Abdomen: Nondistended, nontender. Skin: Warm and dry, without lesions or rashes. Extremities:  No clubbing, cyanosis or edema. Neuro:   Grossly intact.  Diagnositics/Labs: Labs:  Component     Latest Ref Rng & Units 07/20/2016  WBC     3.4 - 10.8 x10E3/uL 13.0 (H)  RBC     3.77 - 5.28 x10E6/uL 4.15  Hemoglobin     11.1 - 15.9 g/dL 47.0  HCT     76.1 - 51.8 % 39.9  MCV     79 - 97 fL 96  MCH     26.6 - 33.0 pg 33.5 (H)  MCHC     31.5 - 35.7 g/dL 34.8  RDW     12.3 - 15.4 % 15.2  Platelets     150 - 379 x10E3/uL 287  Neutrophils     Not Estab. % 69  Lymphs     Not Estab. % 23  Monocytes     Not Estab. % 6  Eos     Not Estab. % 1  Basos     Not Estab. % 0  NEUT#     1.4 - 7.0 x10E3/uL 9.0 (H)  Lymphocyte #     0.7 - 3.1 x10E3/uL 2.9  Monocytes Absolute     0.1 - 0.9 x10E3/uL 0.8  EOS (ABSOLUTE)     0.0 - 0.4 x10E3/uL 0.1  Basophils Absolute     0.0 - 0.2 x10E3/uL 0.0  Immature Granulocytes     Not Estab. % 1  Immature Grans (Abs)     0.0 - 0.1 x10E3/uL 0.1  Glucose     65 - 99 mg/dL 91  BUN     6 - 24 mg/dL 12  Creatinine     0.57 - 1.00 mg/dL 0.96  EGFR (Non-African Amer.)     >59 mL/min/1.73 68  EGFR (African American)     >59 mL/min/1.73 78  BUN/Creatinine Ratio     9 - 23 13  Sodium     134 - 144 mmol/L 137  Potassium     3.5 - 5.2 mmol/L 4.4  Chloride     96 - 106 mmol/L 97  CO2     18 - 29 mmol/L 24  Calcium     8.7 - 10.2 mg/dL 9.6  Total Protein     6.0 - 8.5 g/dL 6.6  Albumin     3.5 - 5.5 g/dL 4.5  Globulin, Total     1.5 - 4.5 g/dL 2.1  Albumin/Globulin Ratio     1.2 - 2.2 2.1  Total Bilirubin     0.0 - 1.2 mg/dL  0.2  Alkaline Phosphatase     39 - 117 IU/L 102  AST     0 - 40 IU/L 10  ALT     0 - 32 IU/L 8   Component     Latest Ref Rng & Units 07/20/2016  Pneumo Ab Type 1*     >1.3 ug/mL 0.5 (L)  Pneumo Ab Type 3*     >1.3 ug/mL 0.8 (L)  Pneumo Ab Type 4*     >1.3 ug/mL 0.2 (L)  Pneumo Ab Type 8*     >1.3 ug/mL 1.4  Pneumo Ab Type 9 (9N)*     >1.3 ug/mL 0.5 (L)  Pneumo Ab Type 12 (24F)*     >1.3 ug/mL <0.1 (L)  Pneumo Ab Type 14*     >1.3 ug/mL 5.7  Pneumo Ab Type 19 (40F)*     >1.3 ug/mL 2.0  Pneumo Ab Type 23 (16F)*     >1.3 ug/mL 0.3 (L)  Pneumo Ab Type 26 (6B)*     >1.3 ug/mL 0.6 (L)  Pneumo Ab Type 51 (16F)*     >1.3 ug/mL 2.4  Pneumo Ab Type 56 (18C)*     >1.3 ug/mL <0.1 (L)  Pneumo Ab Type 57 (19A)*     >1.3 ug/mL 4.6  Pneumo Ab Type 68 (9V)*     >1.3 ug/mL 0.1 (L)  IgG (Immunoglobin G), Serum  700 - 1,600 mg/dL 468 (L)  IgA/Immunoglobulin A, Serum     87 - 352 mg/dL 141  IgM (Immunoglobulin M), Srm     26 - 217 mg/dL 39  Tetanus Ab, IgG     <0.10 IU/mL 0.84  Diphtheria Ab     <0.10 IU/mL <0.10 (L)   Post-Pneumovax Component     Latest Ref Rng & Units 08/16/2016  Pneumo Ab Type 1*     >1.3 ug/mL 0.4 (L)  Pneumo Ab Type 3*     >1.3 ug/mL 0.7 (L)  Pneumo Ab Type 4*     >1.3 ug/mL 0.2 (L)  Pneumo Ab Type 8*     >1.3 ug/mL 1.2 (L)  Pneumo Ab Type 9 (9N)*     >1.3 ug/mL 0.5 (L)  Pneumo Ab Type 12 (226F)*     >1.3 ug/mL <0.1 (L)  Pneumo Ab Type 14*     >1.3 ug/mL 5.2  Pneumo Ab Type 19 (34F)*     >1.3 ug/mL 2.5  Pneumo Ab Type 23 (74F)*     >1.3 ug/mL 0.3 (L)  Pneumo Ab Type 26 (6B)*     >1.3 ug/mL 0.5 (L)  Pneumo Ab Type 51 (26F)*     >1.3 ug/mL 2.1  Pneumo Ab Type 56 (18C)*     >1.3 ug/mL <0.1 (L)  Pneumo Ab Type 57 (19A)*     >1.3 ug/mL 4.0  Pneumo Ab Type 68 (9V)*     >1.3 ug/mL 0.1 (L)   Component     Latest Ref Rng & Units 07/20/2016  IgE (Immunoglobulin E), Serum     0 - 100 IU/mL 374 (H)  D Pteronyssinus IgE     Class 0 kU/L <0.10   D Farinae IgE     Class 0 kU/L <0.10  Cat Dander IgE     Class 0 kU/L <0.10  Dog Dander IgE     Class 0 kU/L <0.10  [place] Grass IgE     Class 0 kU/L <0.10  Timothy Grass IgE     Class 0 kU/L <0.10  [place] Grass IgE     Class 0 kU/L <0.10  Cockroach, German IgE     Class I kU/L 0.38 (A)  Penicillium Chrysogen IgE     Class 0 kU/L <0.10  Cladosporium Herbarum IgE     Class 0 kU/L <0.10  Aspergillus Fumigatus IgE     Class 0 kU/L <0.10  Alternaria Alternata IgE     Class 0 kU/L <0.10  Maple/Box Elder IgE     Class 0 kU/L <0.10  Common Silver Wendee Copp IgE     Class 0 kU/L <0.10  Cedar, [place] IgE     Class 0 kU/L <0.10  Oak, White IgE     Class 0 kU/L <0.10  Elm, American IgE     Class 0 kU/L <0.10  Cottonwood IgE     Class 0 kU/L <0.10  Pecan, Hickory IgE     Class 0 kU/L <0.10  White Mulberry IgE     Class 0 kU/L <0.10  Ragweed, Short IgE     Class 0 kU/L <0.10  Pigweed, Rough IgE     Class 0 kU/L <0.10  Sheep Sorrel IgE Qn     Class 0 kU/L <0.10  Mouse Urine IgE     Class 0 kU/L <0.10  Sed Rate     0 - 40 mm/hr 6  CRP     0.0 - 4.9 mg/L 8.2 (H)  Compl, Total (CH50)     42 - 60 U/mL >60 (H)   Spirometry: FEV1: 1.65L  58%, FVC: 2.25L  63%, ratio consistent with Restrictive pattern  Assessment and plan:   Common Variable Immunodeficiency (CVID)    - With recurrent sinopulmonary infections, low IgG and poor response to vaccine titers despite revaccination meets criteria for CVID diagnosis. She will likely benefit from replacement therapy with IVIG. Discussed at length today the options of IV infusions versus subcutaneous infusions at home. She would like to proceed with IV infusions. We will replace with Privigen at 400 mg/kg  Monthly. Will occur trough IgG levels checked prior to her second infusion and will adjust dosing accordingly. Discussed benefits and risk of IVIG today. She will get pretreatment prior to her infusions of Tylenol and Benadryl. She is  scheduled for first infusion on January 11.   Discussed with her that if she wants to transition over to subcutaneous infusions let us know and this can be arranged.  She was also provided with website information on primary immunodeficiency.     Follow-up in 3-4 months after starting on replacement therapy  I appreciate the opportunity to take part in [place] care. Please do not hesitate to contact me with questions.  Sincerely,   Prudy Feeler, MD Allergy/Immunology [facility] [place]

## 2016-09-01 NOTE — Patient Instructions (Addendum)
Common Variable Immunodeficiency (CVID)    - your immune system doesn't produce appropriate amount of antibodies to help fight of infections.      - IgG (immunoglobulin G) is the most abundant antibody which you do not make enough of.   You also do not make appropriate antibodies to vaccines which is reflective of having low IgG levels.      - we will replace/build up your IgG levels with intravenous immunoglobulin (IVIG).  Done through the infusion center once a month.       - you are scheduled for 1/11 at 10am at the outpatient center with [facility].  You will receive further notification about this appointment.       - we will recheck your IgG level prior to your 2nd infusion.       - advise to maintain good hydration status especially prior to your infusions.       - you will most likely need infusions life-long.       - there is an option of doing subcutaneous replacement therapy in your home.  If you ever would like to change to this route let us know.    This website has great information regarding immunodeficiencies and may search for CVID.      [email or web address]     Follow-up in 3-4 months after starting on replacement therapy

## 2016-09-06 DIAGNOSIS — R1310 Dysphagia, unspecified: Secondary | ICD-10-CM | POA: Diagnosis not present

## 2016-09-06 DIAGNOSIS — K5900 Constipation, unspecified: Secondary | ICD-10-CM | POA: Diagnosis not present

## 2016-09-06 DIAGNOSIS — K219 Gastro-esophageal reflux disease without esophagitis: Secondary | ICD-10-CM | POA: Diagnosis not present

## 2016-09-08 DIAGNOSIS — J180 Bronchopneumonia, unspecified organism: Secondary | ICD-10-CM | POA: Diagnosis not present

## 2016-09-08 DIAGNOSIS — F411 Generalized anxiety disorder: Secondary | ICD-10-CM | POA: Diagnosis not present

## 2016-09-08 DIAGNOSIS — F17210 Nicotine dependence, cigarettes, uncomplicated: Secondary | ICD-10-CM | POA: Diagnosis not present

## 2016-09-08 DIAGNOSIS — J4531 Mild persistent asthma with (acute) exacerbation: Secondary | ICD-10-CM | POA: Diagnosis not present

## 2016-09-09 DIAGNOSIS — D839 Common variable immunodeficiency, unspecified: Secondary | ICD-10-CM | POA: Diagnosis not present

## 2016-09-14 DIAGNOSIS — F17210 Nicotine dependence, cigarettes, uncomplicated: Secondary | ICD-10-CM | POA: Diagnosis not present

## 2016-09-14 DIAGNOSIS — R05 Cough: Secondary | ICD-10-CM | POA: Diagnosis not present

## 2016-09-14 DIAGNOSIS — J4521 Mild intermittent asthma with (acute) exacerbation: Secondary | ICD-10-CM | POA: Diagnosis not present

## 2016-09-20 DIAGNOSIS — M4326 Fusion of spine, lumbar region: Secondary | ICD-10-CM | POA: Diagnosis not present

## 2016-09-20 DIAGNOSIS — G894 Chronic pain syndrome: Secondary | ICD-10-CM | POA: Diagnosis not present

## 2016-10-04 DIAGNOSIS — M899 Disorder of bone, unspecified: Secondary | ICD-10-CM | POA: Diagnosis not present

## 2016-10-04 DIAGNOSIS — D169 Benign neoplasm of bone and articular cartilage, unspecified: Secondary | ICD-10-CM

## 2016-10-04 HISTORY — DX: Benign neoplasm of bone and articular cartilage, unspecified: D16.9

## 2016-10-05 DIAGNOSIS — M5126 Other intervertebral disc displacement, lumbar region: Secondary | ICD-10-CM | POA: Diagnosis not present

## 2016-10-05 DIAGNOSIS — Z981 Arthrodesis status: Secondary | ICD-10-CM | POA: Diagnosis not present

## 2016-10-05 DIAGNOSIS — M4326 Fusion of spine, lumbar region: Secondary | ICD-10-CM | POA: Diagnosis not present

## 2016-10-07 DIAGNOSIS — D839 Common variable immunodeficiency, unspecified: Secondary | ICD-10-CM | POA: Diagnosis not present

## 2016-10-12 DIAGNOSIS — M7552 Bursitis of left shoulder: Secondary | ICD-10-CM | POA: Diagnosis not present

## 2016-10-12 DIAGNOSIS — M5412 Radiculopathy, cervical region: Secondary | ICD-10-CM | POA: Diagnosis not present

## 2016-10-14 DIAGNOSIS — Z79891 Long term (current) use of opiate analgesic: Secondary | ICD-10-CM | POA: Diagnosis not present

## 2016-10-14 DIAGNOSIS — G894 Chronic pain syndrome: Secondary | ICD-10-CM | POA: Diagnosis not present

## 2016-10-14 DIAGNOSIS — Z79899 Other long term (current) drug therapy: Secondary | ICD-10-CM | POA: Diagnosis not present

## 2016-10-14 DIAGNOSIS — M4326 Fusion of spine, lumbar region: Secondary | ICD-10-CM | POA: Diagnosis not present

## 2016-10-18 DIAGNOSIS — M4326 Fusion of spine, lumbar region: Secondary | ICD-10-CM | POA: Diagnosis not present

## 2016-10-18 DIAGNOSIS — M791 Myalgia: Secondary | ICD-10-CM | POA: Diagnosis not present

## 2016-10-18 DIAGNOSIS — M549 Dorsalgia, unspecified: Secondary | ICD-10-CM | POA: Diagnosis not present

## 2016-10-18 DIAGNOSIS — M5106 Intervertebral disc disorders with myelopathy, lumbar region: Secondary | ICD-10-CM | POA: Diagnosis not present

## 2016-10-18 DIAGNOSIS — M545 Low back pain: Secondary | ICD-10-CM | POA: Diagnosis not present

## 2016-10-18 DIAGNOSIS — G894 Chronic pain syndrome: Secondary | ICD-10-CM | POA: Diagnosis not present

## 2016-10-25 DIAGNOSIS — R2689 Other abnormalities of gait and mobility: Secondary | ICD-10-CM | POA: Diagnosis not present

## 2016-10-25 DIAGNOSIS — M545 Low back pain: Secondary | ICD-10-CM | POA: Diagnosis not present

## 2016-10-25 DIAGNOSIS — M4326 Fusion of spine, lumbar region: Secondary | ICD-10-CM | POA: Diagnosis not present

## 2016-10-25 DIAGNOSIS — M5126 Other intervertebral disc displacement, lumbar region: Secondary | ICD-10-CM | POA: Diagnosis not present

## 2016-10-25 DIAGNOSIS — Z981 Arthrodesis status: Secondary | ICD-10-CM | POA: Diagnosis not present

## 2016-10-27 DIAGNOSIS — J432 Centrilobular emphysema: Secondary | ICD-10-CM | POA: Diagnosis not present

## 2016-10-27 DIAGNOSIS — G40219 Localization-related (focal) (partial) symptomatic epilepsy and epileptic syndromes with complex partial seizures, intractable, without status epilepticus: Secondary | ICD-10-CM | POA: Diagnosis not present

## 2016-10-27 DIAGNOSIS — F5231 Female orgasmic disorder: Secondary | ICD-10-CM | POA: Diagnosis not present

## 2016-10-28 DIAGNOSIS — M4716 Other spondylosis with myelopathy, lumbar region: Secondary | ICD-10-CM | POA: Diagnosis not present

## 2016-10-28 DIAGNOSIS — M5416 Radiculopathy, lumbar region: Secondary | ICD-10-CM | POA: Diagnosis not present

## 2016-10-28 DIAGNOSIS — G894 Chronic pain syndrome: Secondary | ICD-10-CM | POA: Diagnosis not present

## 2016-10-28 DIAGNOSIS — M5136 Other intervertebral disc degeneration, lumbar region: Secondary | ICD-10-CM | POA: Diagnosis not present

## 2016-11-01 DIAGNOSIS — K5900 Constipation, unspecified: Secondary | ICD-10-CM | POA: Diagnosis not present

## 2016-11-01 DIAGNOSIS — G43009 Migraine without aura, not intractable, without status migrainosus: Secondary | ICD-10-CM | POA: Diagnosis not present

## 2016-11-01 DIAGNOSIS — F445 Conversion disorder with seizures or convulsions: Secondary | ICD-10-CM | POA: Diagnosis not present

## 2016-11-01 DIAGNOSIS — G8929 Other chronic pain: Secondary | ICD-10-CM | POA: Diagnosis not present

## 2016-11-01 DIAGNOSIS — R55 Syncope and collapse: Secondary | ICD-10-CM | POA: Diagnosis not present

## 2016-11-01 DIAGNOSIS — K219 Gastro-esophageal reflux disease without esophagitis: Secondary | ICD-10-CM | POA: Diagnosis not present

## 2016-11-01 DIAGNOSIS — M5442 Lumbago with sciatica, left side: Secondary | ICD-10-CM | POA: Diagnosis not present

## 2016-11-01 DIAGNOSIS — F411 Generalized anxiety disorder: Secondary | ICD-10-CM | POA: Diagnosis not present

## 2016-11-01 DIAGNOSIS — R1310 Dysphagia, unspecified: Secondary | ICD-10-CM | POA: Diagnosis not present

## 2016-11-01 DIAGNOSIS — R269 Unspecified abnormalities of gait and mobility: Secondary | ICD-10-CM | POA: Diagnosis not present

## 2016-11-01 DIAGNOSIS — G40219 Localization-related (focal) (partial) symptomatic epilepsy and epileptic syndromes with complex partial seizures, intractable, without status epilepticus: Secondary | ICD-10-CM | POA: Diagnosis not present

## 2016-11-03 DIAGNOSIS — D849 Immunodeficiency, unspecified: Secondary | ICD-10-CM | POA: Diagnosis not present

## 2016-11-03 DIAGNOSIS — G40219 Localization-related (focal) (partial) symptomatic epilepsy and epileptic syndromes with complex partial seizures, intractable, without status epilepticus: Secondary | ICD-10-CM | POA: Diagnosis not present

## 2016-11-03 DIAGNOSIS — J432 Centrilobular emphysema: Secondary | ICD-10-CM | POA: Diagnosis not present

## 2016-11-03 DIAGNOSIS — M5136 Other intervertebral disc degeneration, lumbar region: Secondary | ICD-10-CM | POA: Diagnosis not present

## 2016-11-03 DIAGNOSIS — J45909 Unspecified asthma, uncomplicated: Secondary | ICD-10-CM | POA: Diagnosis not present

## 2016-11-03 DIAGNOSIS — J449 Chronic obstructive pulmonary disease, unspecified: Secondary | ICD-10-CM | POA: Diagnosis not present

## 2016-11-03 DIAGNOSIS — E119 Type 2 diabetes mellitus without complications: Secondary | ICD-10-CM | POA: Diagnosis not present

## 2016-11-03 DIAGNOSIS — M4716 Other spondylosis with myelopathy, lumbar region: Secondary | ICD-10-CM | POA: Diagnosis not present

## 2016-11-04 DIAGNOSIS — J4530 Mild persistent asthma, uncomplicated: Secondary | ICD-10-CM | POA: Diagnosis not present

## 2016-11-04 DIAGNOSIS — F411 Generalized anxiety disorder: Secondary | ICD-10-CM | POA: Diagnosis not present

## 2016-11-04 DIAGNOSIS — J310 Chronic rhinitis: Secondary | ICD-10-CM | POA: Diagnosis not present

## 2016-11-04 DIAGNOSIS — R5383 Other fatigue: Secondary | ICD-10-CM | POA: Diagnosis not present

## 2016-11-04 DIAGNOSIS — E119 Type 2 diabetes mellitus without complications: Secondary | ICD-10-CM | POA: Diagnosis not present

## 2016-11-04 DIAGNOSIS — F17210 Nicotine dependence, cigarettes, uncomplicated: Secondary | ICD-10-CM | POA: Diagnosis not present

## 2016-11-04 DIAGNOSIS — G2581 Restless legs syndrome: Secondary | ICD-10-CM | POA: Diagnosis not present

## 2016-11-05 DIAGNOSIS — D839 Common variable immunodeficiency, unspecified: Secondary | ICD-10-CM | POA: Diagnosis not present

## 2016-11-08 DIAGNOSIS — M4716 Other spondylosis with myelopathy, lumbar region: Secondary | ICD-10-CM | POA: Diagnosis not present

## 2016-11-08 DIAGNOSIS — Z01810 Encounter for preprocedural cardiovascular examination: Secondary | ICD-10-CM | POA: Diagnosis not present

## 2016-11-08 DIAGNOSIS — M48061 Spinal stenosis, lumbar region without neurogenic claudication: Secondary | ICD-10-CM | POA: Diagnosis not present

## 2016-11-09 DIAGNOSIS — Z86010 Personal history of colonic polyps: Secondary | ICD-10-CM | POA: Diagnosis not present

## 2016-11-09 DIAGNOSIS — K648 Other hemorrhoids: Secondary | ICD-10-CM | POA: Diagnosis not present

## 2016-11-09 DIAGNOSIS — K5900 Constipation, unspecified: Secondary | ICD-10-CM | POA: Diagnosis not present

## 2016-11-12 DIAGNOSIS — M5137 Other intervertebral disc degeneration, lumbosacral region: Secondary | ICD-10-CM | POA: Diagnosis not present

## 2016-11-12 DIAGNOSIS — M545 Low back pain: Secondary | ICD-10-CM | POA: Diagnosis not present

## 2016-11-12 DIAGNOSIS — M961 Postlaminectomy syndrome, not elsewhere classified: Secondary | ICD-10-CM | POA: Diagnosis not present

## 2016-11-12 DIAGNOSIS — M4716 Other spondylosis with myelopathy, lumbar region: Secondary | ICD-10-CM | POA: Diagnosis not present

## 2016-11-15 DIAGNOSIS — Z5189 Encounter for other specified aftercare: Secondary | ICD-10-CM | POA: Diagnosis not present

## 2016-11-15 DIAGNOSIS — M4716 Other spondylosis with myelopathy, lumbar region: Secondary | ICD-10-CM | POA: Diagnosis not present

## 2016-11-16 ENCOUNTER — Encounter: Payer: Self-pay | Admitting: Allergy

## 2016-11-16 ENCOUNTER — Ambulatory Visit (INDEPENDENT_AMBULATORY_CARE_PROVIDER_SITE_OTHER): Payer: Medicare Other | Admitting: Allergy

## 2016-11-16 VITALS — BP 130/80 | HR 72 | Resp 20

## 2016-11-16 DIAGNOSIS — D839 Common variable immunodeficiency, unspecified: Secondary | ICD-10-CM

## 2016-11-16 NOTE — Progress Notes (Signed)
Follow-up Note  RE: Kathy Moss MRN: [id number] DOB: [birth year] Date of Office Visit: 11/16/2016   History of present illness: Kathy Moss is a 56 y.o. female presenting today for follow-up of CVID and for pre-op clearance for back surgery planned for next week 11/22/16.   She was last seen on 09/01/2016 by myself.  After that visit she was started on IVIG infusions monthly.  She reports the infusions are going well.  Last infusion was November 05, 2016 and is due again for April 6.   She is concerned about being able to have infusion done due to her back surgery.  Since the last infusion she has noted some improvements in her level of fatigue and her appetite has improved.  She also has not required any antibiotics for an infection since starting on IVIG. However she reports that her PCP did give her an antibiotic course as a precaution leading up to her back surgery.        Review of systems: Review of Systems  Constitutional: Positive for malaise/fatigue. Negative for chills and fever.  HENT: Positive for congestion. Negative for nosebleeds, sinus pain and sore throat.   Eyes: Negative for redness.  Respiratory: Positive for cough, shortness of breath and wheezing.   Cardiovascular: Negative for chest pain.  Gastrointestinal: Negative for heartburn, nausea and vomiting.  Musculoskeletal: Positive for back pain.  Skin: Negative for itching and rash.  Neurological: Positive for weakness.    All other systems negative unless noted above in HPI  Past medical/social/surgical/family history have been reviewed and are unchanged unless specifically indicated below.  No changes  Medication List: Allergies as of 11/16/2016      Reactions   Imitrex [sumatriptan] Palpitations   Morphine Itching   Tramadol Palpitations   Adhesive [tape] Other (See Comments)   TAKES SKIN OFF (paper/silk tape) MEDICAL TAPE CAUSES BRUISES AND TEARS SKIN,  PAPER TAPE IS OK   Other    Amitriptyline  Palpitations   Buprenorphine Hcl Itching   Morphine And Related Itching   Sumatriptan Succinate Palpitations   Tramadol-acetaminophen Palpitations      Medication List       Accurate as of 11/16/16 12:49 PM. Always use your most recent med list.          albuterol 108 (90 Base) MCG/ACT inhaler Commonly known as:  PROVENTIL HFA;VENTOLIN HFA Inhale 1-2 puffs into the lungs every 6 (six) hours as needed for wheezing or shortness of breath.   ALPRAZolam 0.5 MG tablet Commonly known as:  XANAX Take 0.5 mg by mouth 3 (three) times daily as needed for anxiety.   aspirin EC 81 MG tablet Take 81 mg by mouth daily.   azelastine 0.1 % nasal spray Commonly known as:  ASTELIN Place 1 spray into both nostrils 2 (two) times daily. Use in each nostril as directed   beclomethasone 80 MCG/ACT inhaler Commonly known as:  QVAR Inhale 1 puff into the lungs 2 (two) times daily.   BREO ELLIPTA 200-25 MCG/INH Aepb Generic drug:  fluticasone furoate-vilanterol Inhale 1 puff into the lungs 2 (two) times daily.   butalbital-acetaminophen-caffeine 50-325-40 MG tablet Commonly known as:  FIORICET, ESGIC TAKE 1 TABLET BY MOUTH EVERY 6 HOURS AS NEEDED FOR PAIN.   diltiazem 240 MG 24 hr capsule Commonly known as:  DILACOR XR Take 240 mg by mouth daily.   diltiazem 240 MG 24 hr capsule Commonly known as:  CARDIZEM CD Take 240 mg by mouth.  esomeprazole 40 MG capsule Commonly known as:  NEXIUM Take 40 mg by mouth 3 (three) times daily.   eszopiclone 3 MG Tabs Generic drug:  Eszopiclone Take 3 mg by mouth at bedtime. Take immediately before bedtime   FISH OIL PO Take 1,000 mg by mouth 4 (four) times daily.   fluticasone 50 MCG/ACT nasal spray Commonly known as:  FLONASE Place 1 spray into both nostrils daily.   gabapentin 300 MG capsule Commonly known as:  NEURONTIN Take 300 mg by mouth 3 (three) times daily.   lacosamide 200 MG Tabs tablet Commonly known as:  VIMPAT Take 200 mg  by mouth 2 (two) times daily.   lamoTRIgine 25 MG tablet Commonly known as:  LAMICTAL Take 25 mg by mouth 2 (two) times daily.   levETIRAcetam 500 MG tablet Commonly known as:  KEPPRA Take 500 mg by mouth 2 (two) times daily.   nitroGLYCERIN 0.4 MG SL tablet Commonly known as:  NITROSTAT Place 0.4 mg under the tongue every 5 (five) minutes as needed for chest pain.   NORCO PO Take 1 tablet by mouth every 4 (four) hours as needed.   nystatin 100000 UNIT/ML suspension Commonly known as:  MYCOSTATIN Take 5 mLs by mouth 3 (three) times daily.   olmesartan 20 MG tablet Commonly known as:  BENICAR Take 20 mg by mouth daily.   promethazine 25 MG tablet Commonly known as:  PHENERGAN Take 25 mg by mouth every 6 (six) hours as needed for nausea or vomiting.   ranitidine 300 MG tablet Commonly known as:  ZANTAC Take 300 mg by mouth at bedtime.   ranolazine 500 MG 12 hr tablet Commonly known as:  RANEXA Take 500 mg by mouth 2 (two) times daily.   rOPINIRole 1 MG tablet Commonly known as:  REQUIP Take 3 mg by mouth at bedtime.   rosuvastatin 5 MG tablet Commonly known as:  CRESTOR Take 5 mg by mouth daily at 6 PM.   sitaGLIPtin 25 MG tablet Commonly known as:  JANUVIA Take 25 mg by mouth 2 (two) times daily.   tiotropium 18 MCG inhalation capsule Commonly known as:  SPIRIVA Place 18 mcg into inhaler and inhale daily.   tiZANidine 4 MG capsule Commonly known as:  ZANAFLEX Take 4 mg by mouth 3 (three) times daily.   Vitamin D (Ergocalciferol) 50000 units Caps capsule Commonly known as:  DRISDOL Take 50,000 Units by mouth every Tuesday.       Known medication allergies: Allergies  Allergen Reactions  . Imitrex [Sumatriptan] Palpitations  . Morphine Itching  . Tramadol Palpitations  . Adhesive [Tape] Other (See Comments)    TAKES SKIN OFF (paper/silk tape) MEDICAL TAPE CAUSES BRUISES AND TEARS SKIN,  PAPER TAPE IS OK  . Other   . Amitriptyline Palpitations    . Buprenorphine Hcl Itching  . Morphine And Related Itching  . Sumatriptan Succinate Palpitations  . Tramadol-Acetaminophen Palpitations     Physical examination: Blood pressure 130/80, pulse 72, resp. rate 20.  General: Alert, interactive, in no acute distress. HEENT: TMs pearly gray, turbinates mildly edematous without discharge, post-pharynx non erythematous. Neck: Supple without lymphadenopathy. Lungs: Clear to auscultation without wheezing, rhonchi or rales. {no increased work of breathing. CV: Normal S1, S2 without murmurs. Abdomen: Nondistended, nontender. Skin: Warm and dry, without lesions or rashes. Extremities:  No clubbing, cyanosis or edema. Neuro:   Grossly intact.  Diagnositics/Labs: Labs: None available. She has not gotten any trough level IgG I before her infusions  Spirometry: FEV1: 1.65L  59%, FVC: 2.46L  69%, ratio consistent with Previous study  Assessment and plan:   Common Variable Immunodeficiency (CVID)     - She is doing well on monthly IVIG infusions without any significant side effects     - She has not had any trough level IgG strong prior to her infusions. We'll discuss this with infusion center to ensure that trough levels are being drawn in order to adjust her dosing if needed.     - will recheck your IgG level today to ensure it is in a good range for your surgery      - Patient and significant other are concerned about her next infusion April given her back surgery and possibly not being able to ride in the car and for sick in the infusion shares for long duration.  We are trying to arrange next infusion to be in the hospital [facility]) before you are to be discharged from your surgery next week.       - From an immunology standpoint there is no reason at this time that she is not able to proceed with her surgery.     - Also provided information on Hizentra and in home infusions as they are possibly interested in changing over to this  route    Follow-up in 3-4 months     I appreciate the opportunity to take part in [place] care. Please do not hesitate to contact me with questions.  Sincerely,   Prudy Feeler, MD Allergy/Immunology [facility] [place]

## 2016-11-16 NOTE — Patient Instructions (Addendum)
Common Variable Immunodeficiency (CVID)    - your immune system doesn't produce appropriate amount of antibodies to help fight of infections.      - IgG (immunoglobulin G) is the most abundant antibody which you do not make enough of.   You also do not make appropriate antibodies to vaccines which is reflective of having low IgG levels.      -  Continue your monthly IVIG infusions.   We are trying to arrange your next infusion to be in the hospital [facility]) before you are to be discharged from your surgery next week.       - we will recheck your IgG level today to ensure it is in a good range for your surgery      - advise to maintain good hydration status especially prior to your infusions.       - you will most likely need infusions life-long.       - I will provide clearance for your surgery    Visit  [email or web address]    To learn more about doing at-home infusions of IgG    Follow-up in 3-4 months after starting on replacement therapy

## 2016-11-18 ENCOUNTER — Telehealth: Payer: Self-pay | Admitting: *Deleted

## 2016-11-18 NOTE — Telephone Encounter (Signed)
Great that sounds good.  Yes Carimune is fine at same dosing and pre-treatment.     IgG is in good range to be mid month so expect her trough is in a normal range and her current dose is appropriate.   Thanks so much for working on this Tammy.

## 2016-11-18 NOTE — Telephone Encounter (Signed)
Joy from [facility] office advised that they were planning to infuse patient IVIG at [facility] next week after back surgery.  She advised that [facility] does not have Privigin like patient has been getting at [facility] but has Carimune they can use. I told her that I had already discussed with Dr Nelva Bush and that would be ok to use. Also patient lab from [facility] came back in your box in [facility] and her IgG is 856 on 11/16/16

## 2016-11-22 DIAGNOSIS — J440 Chronic obstructive pulmonary disease with acute lower respiratory infection: Secondary | ICD-10-CM | POA: Diagnosis not present

## 2016-11-22 DIAGNOSIS — M5126 Other intervertebral disc displacement, lumbar region: Secondary | ICD-10-CM | POA: Diagnosis present

## 2016-11-22 DIAGNOSIS — Z9981 Dependence on supplemental oxygen: Secondary | ICD-10-CM | POA: Diagnosis not present

## 2016-11-22 DIAGNOSIS — M4326 Fusion of spine, lumbar region: Secondary | ICD-10-CM | POA: Diagnosis not present

## 2016-11-22 DIAGNOSIS — T84296A Other mechanical complication of internal fixation device of vertebrae, initial encounter: Secondary | ICD-10-CM | POA: Diagnosis not present

## 2016-11-22 DIAGNOSIS — J189 Pneumonia, unspecified organism: Secondary | ICD-10-CM | POA: Diagnosis not present

## 2016-11-22 DIAGNOSIS — I950 Idiopathic hypotension: Secondary | ICD-10-CM | POA: Diagnosis not present

## 2016-11-22 DIAGNOSIS — M5137 Other intervertebral disc degeneration, lumbosacral region: Secondary | ICD-10-CM | POA: Diagnosis not present

## 2016-11-22 DIAGNOSIS — D649 Anemia, unspecified: Secondary | ICD-10-CM | POA: Diagnosis not present

## 2016-11-22 DIAGNOSIS — Z7951 Long term (current) use of inhaled steroids: Secondary | ICD-10-CM | POA: Diagnosis not present

## 2016-11-22 DIAGNOSIS — G40211 Localization-related (focal) (partial) symptomatic epilepsy and epileptic syndromes with complex partial seizures, intractable, with status epilepticus: Secondary | ICD-10-CM | POA: Diagnosis not present

## 2016-11-22 DIAGNOSIS — Z4789 Encounter for other orthopedic aftercare: Secondary | ICD-10-CM | POA: Diagnosis not present

## 2016-11-22 DIAGNOSIS — R569 Unspecified convulsions: Secondary | ICD-10-CM | POA: Diagnosis not present

## 2016-11-22 DIAGNOSIS — D839 Common variable immunodeficiency, unspecified: Secondary | ICD-10-CM | POA: Diagnosis not present

## 2016-11-22 DIAGNOSIS — A419 Sepsis, unspecified organism: Secondary | ICD-10-CM | POA: Diagnosis not present

## 2016-11-22 DIAGNOSIS — J181 Lobar pneumonia, unspecified organism: Secondary | ICD-10-CM | POA: Diagnosis not present

## 2016-11-22 DIAGNOSIS — I959 Hypotension, unspecified: Secondary | ICD-10-CM | POA: Diagnosis not present

## 2016-11-22 DIAGNOSIS — Z7982 Long term (current) use of aspirin: Secondary | ICD-10-CM | POA: Diagnosis not present

## 2016-11-22 DIAGNOSIS — M6281 Muscle weakness (generalized): Secondary | ICD-10-CM | POA: Diagnosis present

## 2016-11-22 DIAGNOSIS — Z8701 Personal history of pneumonia (recurrent): Secondary | ICD-10-CM | POA: Diagnosis not present

## 2016-11-22 DIAGNOSIS — M48061 Spinal stenosis, lumbar region without neurogenic claudication: Secondary | ICD-10-CM | POA: Diagnosis not present

## 2016-11-22 DIAGNOSIS — I10 Essential (primary) hypertension: Secondary | ICD-10-CM | POA: Diagnosis present

## 2016-11-22 DIAGNOSIS — M5136 Other intervertebral disc degeneration, lumbar region: Secondary | ICD-10-CM | POA: Diagnosis present

## 2016-11-22 DIAGNOSIS — M961 Postlaminectomy syndrome, not elsewhere classified: Secondary | ICD-10-CM | POA: Diagnosis not present

## 2016-11-22 DIAGNOSIS — E119 Type 2 diabetes mellitus without complications: Secondary | ICD-10-CM | POA: Diagnosis not present

## 2016-11-22 DIAGNOSIS — J449 Chronic obstructive pulmonary disease, unspecified: Secondary | ICD-10-CM | POA: Diagnosis not present

## 2016-11-22 DIAGNOSIS — F17210 Nicotine dependence, cigarettes, uncomplicated: Secondary | ICD-10-CM | POA: Diagnosis present

## 2016-11-22 DIAGNOSIS — M4716 Other spondylosis with myelopathy, lumbar region: Secondary | ICD-10-CM | POA: Diagnosis not present

## 2016-11-22 DIAGNOSIS — Z7984 Long term (current) use of oral hypoglycemic drugs: Secondary | ICD-10-CM | POA: Diagnosis not present

## 2016-11-22 DIAGNOSIS — J45909 Unspecified asthma, uncomplicated: Secondary | ICD-10-CM | POA: Diagnosis present

## 2016-11-22 DIAGNOSIS — E785 Hyperlipidemia, unspecified: Secondary | ICD-10-CM | POA: Diagnosis present

## 2016-11-22 DIAGNOSIS — Z981 Arthrodesis status: Secondary | ICD-10-CM | POA: Diagnosis not present

## 2016-11-22 DIAGNOSIS — R400 Somnolence: Secondary | ICD-10-CM | POA: Diagnosis not present

## 2016-11-22 DIAGNOSIS — R918 Other nonspecific abnormal finding of lung field: Secondary | ICD-10-CM | POA: Diagnosis not present

## 2016-11-23 DIAGNOSIS — M961 Postlaminectomy syndrome, not elsewhere classified: Secondary | ICD-10-CM

## 2016-11-23 DIAGNOSIS — M4716 Other spondylosis with myelopathy, lumbar region: Secondary | ICD-10-CM | POA: Insufficient documentation

## 2016-11-23 DIAGNOSIS — Z4789 Encounter for other orthopedic aftercare: Secondary | ICD-10-CM

## 2016-11-23 HISTORY — DX: Encounter for other orthopedic aftercare: Z47.89

## 2016-11-23 HISTORY — DX: Postlaminectomy syndrome, not elsewhere classified: M96.1

## 2016-11-23 HISTORY — DX: Other spondylosis with myelopathy, lumbar region: M47.16

## 2016-11-24 DIAGNOSIS — R569 Unspecified convulsions: Secondary | ICD-10-CM | POA: Insufficient documentation

## 2016-11-24 DIAGNOSIS — I950 Idiopathic hypotension: Secondary | ICD-10-CM | POA: Insufficient documentation

## 2016-11-24 HISTORY — DX: Idiopathic hypotension: I95.0

## 2016-11-25 DIAGNOSIS — A419 Sepsis, unspecified organism: Secondary | ICD-10-CM

## 2016-11-25 DIAGNOSIS — J189 Pneumonia, unspecified organism: Secondary | ICD-10-CM | POA: Insufficient documentation

## 2016-11-25 DIAGNOSIS — D839 Common variable immunodeficiency, unspecified: Secondary | ICD-10-CM | POA: Insufficient documentation

## 2016-11-25 HISTORY — DX: Sepsis, unspecified organism: A41.9

## 2016-11-25 HISTORY — DX: Pneumonia, unspecified organism: J18.9

## 2016-11-26 DIAGNOSIS — K5900 Constipation, unspecified: Secondary | ICD-10-CM | POA: Diagnosis present

## 2016-11-26 DIAGNOSIS — M4716 Other spondylosis with myelopathy, lumbar region: Secondary | ICD-10-CM | POA: Diagnosis not present

## 2016-11-26 DIAGNOSIS — R400 Somnolence: Secondary | ICD-10-CM | POA: Diagnosis not present

## 2016-11-26 DIAGNOSIS — J449 Chronic obstructive pulmonary disease, unspecified: Secondary | ICD-10-CM | POA: Diagnosis not present

## 2016-11-26 DIAGNOSIS — J181 Lobar pneumonia, unspecified organism: Secondary | ICD-10-CM | POA: Diagnosis not present

## 2016-11-26 DIAGNOSIS — J45909 Unspecified asthma, uncomplicated: Secondary | ICD-10-CM | POA: Diagnosis not present

## 2016-11-26 DIAGNOSIS — D649 Anemia, unspecified: Secondary | ICD-10-CM | POA: Diagnosis not present

## 2016-11-26 DIAGNOSIS — R531 Weakness: Secondary | ICD-10-CM | POA: Diagnosis present

## 2016-11-26 DIAGNOSIS — F17210 Nicotine dependence, cigarettes, uncomplicated: Secondary | ICD-10-CM | POA: Diagnosis present

## 2016-11-26 DIAGNOSIS — Z716 Tobacco abuse counseling: Secondary | ICD-10-CM | POA: Diagnosis not present

## 2016-11-26 DIAGNOSIS — J189 Pneumonia, unspecified organism: Secondary | ICD-10-CM | POA: Diagnosis not present

## 2016-11-26 DIAGNOSIS — Z981 Arthrodesis status: Secondary | ICD-10-CM | POA: Diagnosis not present

## 2016-11-26 DIAGNOSIS — T428X5A Adverse effect of antiparkinsonism drugs and other central muscle-tone depressants, initial encounter: Secondary | ICD-10-CM | POA: Diagnosis present

## 2016-11-26 DIAGNOSIS — D62 Acute posthemorrhagic anemia: Secondary | ICD-10-CM | POA: Diagnosis present

## 2016-11-26 DIAGNOSIS — A419 Sepsis, unspecified organism: Secondary | ICD-10-CM | POA: Diagnosis not present

## 2016-11-26 DIAGNOSIS — D839 Common variable immunodeficiency, unspecified: Secondary | ICD-10-CM | POA: Diagnosis not present

## 2016-11-26 DIAGNOSIS — R1110 Vomiting, unspecified: Secondary | ICD-10-CM | POA: Diagnosis not present

## 2016-11-26 DIAGNOSIS — I10 Essential (primary) hypertension: Secondary | ICD-10-CM | POA: Diagnosis present

## 2016-11-26 DIAGNOSIS — I950 Idiopathic hypotension: Secondary | ICD-10-CM | POA: Diagnosis not present

## 2016-11-26 DIAGNOSIS — E119 Type 2 diabetes mellitus without complications: Secondary | ICD-10-CM | POA: Diagnosis not present

## 2016-11-26 DIAGNOSIS — R569 Unspecified convulsions: Secondary | ICD-10-CM | POA: Diagnosis present

## 2016-11-26 DIAGNOSIS — J439 Emphysema, unspecified: Secondary | ICD-10-CM | POA: Diagnosis present

## 2016-12-03 DIAGNOSIS — E119 Type 2 diabetes mellitus without complications: Secondary | ICD-10-CM | POA: Diagnosis not present

## 2016-12-03 DIAGNOSIS — F17210 Nicotine dependence, cigarettes, uncomplicated: Secondary | ICD-10-CM | POA: Diagnosis not present

## 2016-12-03 DIAGNOSIS — Z981 Arthrodesis status: Secondary | ICD-10-CM | POA: Diagnosis not present

## 2016-12-03 DIAGNOSIS — G4733 Obstructive sleep apnea (adult) (pediatric): Secondary | ICD-10-CM | POA: Diagnosis not present

## 2016-12-03 DIAGNOSIS — J440 Chronic obstructive pulmonary disease with acute lower respiratory infection: Secondary | ICD-10-CM | POA: Diagnosis not present

## 2016-12-03 DIAGNOSIS — Z79891 Long term (current) use of opiate analgesic: Secondary | ICD-10-CM | POA: Diagnosis not present

## 2016-12-03 DIAGNOSIS — Z9981 Dependence on supplemental oxygen: Secondary | ICD-10-CM | POA: Diagnosis not present

## 2016-12-03 DIAGNOSIS — M961 Postlaminectomy syndrome, not elsewhere classified: Secondary | ICD-10-CM | POA: Diagnosis not present

## 2016-12-03 DIAGNOSIS — G40211 Localization-related (focal) (partial) symptomatic epilepsy and epileptic syndromes with complex partial seizures, intractable, with status epilepticus: Secondary | ICD-10-CM | POA: Diagnosis not present

## 2016-12-03 DIAGNOSIS — J189 Pneumonia, unspecified organism: Secondary | ICD-10-CM | POA: Diagnosis not present

## 2016-12-03 DIAGNOSIS — Z9181 History of falling: Secondary | ICD-10-CM | POA: Diagnosis not present

## 2016-12-03 DIAGNOSIS — I950 Idiopathic hypotension: Secondary | ICD-10-CM | POA: Diagnosis not present

## 2016-12-03 DIAGNOSIS — A419 Sepsis, unspecified organism: Secondary | ICD-10-CM | POA: Diagnosis not present

## 2016-12-03 DIAGNOSIS — Z4789 Encounter for other orthopedic aftercare: Secondary | ICD-10-CM | POA: Diagnosis not present

## 2016-12-03 DIAGNOSIS — D839 Common variable immunodeficiency, unspecified: Secondary | ICD-10-CM | POA: Diagnosis not present

## 2016-12-03 DIAGNOSIS — Z794 Long term (current) use of insulin: Secondary | ICD-10-CM | POA: Diagnosis not present

## 2016-12-06 DIAGNOSIS — J189 Pneumonia, unspecified organism: Secondary | ICD-10-CM | POA: Diagnosis not present

## 2016-12-06 DIAGNOSIS — A419 Sepsis, unspecified organism: Secondary | ICD-10-CM | POA: Diagnosis not present

## 2016-12-06 DIAGNOSIS — Z4789 Encounter for other orthopedic aftercare: Secondary | ICD-10-CM | POA: Diagnosis not present

## 2016-12-06 DIAGNOSIS — E119 Type 2 diabetes mellitus without complications: Secondary | ICD-10-CM | POA: Diagnosis not present

## 2016-12-06 DIAGNOSIS — J440 Chronic obstructive pulmonary disease with acute lower respiratory infection: Secondary | ICD-10-CM | POA: Diagnosis not present

## 2016-12-06 DIAGNOSIS — M961 Postlaminectomy syndrome, not elsewhere classified: Secondary | ICD-10-CM | POA: Diagnosis not present

## 2016-12-07 DIAGNOSIS — M961 Postlaminectomy syndrome, not elsewhere classified: Secondary | ICD-10-CM | POA: Diagnosis not present

## 2016-12-07 DIAGNOSIS — Z4789 Encounter for other orthopedic aftercare: Secondary | ICD-10-CM | POA: Diagnosis not present

## 2016-12-07 DIAGNOSIS — E119 Type 2 diabetes mellitus without complications: Secondary | ICD-10-CM | POA: Diagnosis not present

## 2016-12-07 DIAGNOSIS — J189 Pneumonia, unspecified organism: Secondary | ICD-10-CM | POA: Diagnosis not present

## 2016-12-07 DIAGNOSIS — A419 Sepsis, unspecified organism: Secondary | ICD-10-CM | POA: Diagnosis not present

## 2016-12-07 DIAGNOSIS — J440 Chronic obstructive pulmonary disease with acute lower respiratory infection: Secondary | ICD-10-CM | POA: Diagnosis not present

## 2016-12-09 DIAGNOSIS — E119 Type 2 diabetes mellitus without complications: Secondary | ICD-10-CM | POA: Diagnosis not present

## 2016-12-09 DIAGNOSIS — Z4789 Encounter for other orthopedic aftercare: Secondary | ICD-10-CM | POA: Diagnosis not present

## 2016-12-09 DIAGNOSIS — J440 Chronic obstructive pulmonary disease with acute lower respiratory infection: Secondary | ICD-10-CM | POA: Diagnosis not present

## 2016-12-09 DIAGNOSIS — M961 Postlaminectomy syndrome, not elsewhere classified: Secondary | ICD-10-CM | POA: Diagnosis not present

## 2016-12-09 DIAGNOSIS — A419 Sepsis, unspecified organism: Secondary | ICD-10-CM | POA: Diagnosis not present

## 2016-12-09 DIAGNOSIS — J189 Pneumonia, unspecified organism: Secondary | ICD-10-CM | POA: Diagnosis not present

## 2016-12-10 DIAGNOSIS — A419 Sepsis, unspecified organism: Secondary | ICD-10-CM | POA: Diagnosis not present

## 2016-12-10 DIAGNOSIS — E119 Type 2 diabetes mellitus without complications: Secondary | ICD-10-CM | POA: Diagnosis not present

## 2016-12-10 DIAGNOSIS — J189 Pneumonia, unspecified organism: Secondary | ICD-10-CM | POA: Diagnosis not present

## 2016-12-10 DIAGNOSIS — Z4789 Encounter for other orthopedic aftercare: Secondary | ICD-10-CM | POA: Diagnosis not present

## 2016-12-10 DIAGNOSIS — J440 Chronic obstructive pulmonary disease with acute lower respiratory infection: Secondary | ICD-10-CM | POA: Diagnosis not present

## 2016-12-10 DIAGNOSIS — M961 Postlaminectomy syndrome, not elsewhere classified: Secondary | ICD-10-CM | POA: Diagnosis not present

## 2016-12-13 DIAGNOSIS — E119 Type 2 diabetes mellitus without complications: Secondary | ICD-10-CM | POA: Diagnosis not present

## 2016-12-13 DIAGNOSIS — J189 Pneumonia, unspecified organism: Secondary | ICD-10-CM | POA: Diagnosis not present

## 2016-12-13 DIAGNOSIS — A419 Sepsis, unspecified organism: Secondary | ICD-10-CM | POA: Diagnosis not present

## 2016-12-13 DIAGNOSIS — M961 Postlaminectomy syndrome, not elsewhere classified: Secondary | ICD-10-CM | POA: Diagnosis not present

## 2016-12-13 DIAGNOSIS — J440 Chronic obstructive pulmonary disease with acute lower respiratory infection: Secondary | ICD-10-CM | POA: Diagnosis not present

## 2016-12-13 DIAGNOSIS — Z4789 Encounter for other orthopedic aftercare: Secondary | ICD-10-CM | POA: Diagnosis not present

## 2016-12-15 DIAGNOSIS — M961 Postlaminectomy syndrome, not elsewhere classified: Secondary | ICD-10-CM | POA: Diagnosis not present

## 2016-12-15 DIAGNOSIS — J189 Pneumonia, unspecified organism: Secondary | ICD-10-CM | POA: Diagnosis not present

## 2016-12-15 DIAGNOSIS — Z4789 Encounter for other orthopedic aftercare: Secondary | ICD-10-CM | POA: Diagnosis not present

## 2016-12-15 DIAGNOSIS — J440 Chronic obstructive pulmonary disease with acute lower respiratory infection: Secondary | ICD-10-CM | POA: Diagnosis not present

## 2016-12-15 DIAGNOSIS — A419 Sepsis, unspecified organism: Secondary | ICD-10-CM | POA: Diagnosis not present

## 2016-12-15 DIAGNOSIS — E119 Type 2 diabetes mellitus without complications: Secondary | ICD-10-CM | POA: Diagnosis not present

## 2016-12-16 DIAGNOSIS — M1A9XX0 Chronic gout, unspecified, without tophus (tophi): Secondary | ICD-10-CM | POA: Insufficient documentation

## 2016-12-16 DIAGNOSIS — D169 Benign neoplasm of bone and articular cartilage, unspecified: Secondary | ICD-10-CM | POA: Diagnosis not present

## 2016-12-16 DIAGNOSIS — K219 Gastro-esophageal reflux disease without esophagitis: Secondary | ICD-10-CM

## 2016-12-16 DIAGNOSIS — I25118 Atherosclerotic heart disease of native coronary artery with other forms of angina pectoris: Secondary | ICD-10-CM

## 2016-12-16 DIAGNOSIS — E119 Type 2 diabetes mellitus without complications: Secondary | ICD-10-CM | POA: Diagnosis not present

## 2016-12-16 DIAGNOSIS — M5136 Other intervertebral disc degeneration, lumbar region: Secondary | ICD-10-CM | POA: Diagnosis not present

## 2016-12-16 DIAGNOSIS — G4733 Obstructive sleep apnea (adult) (pediatric): Secondary | ICD-10-CM | POA: Diagnosis not present

## 2016-12-16 DIAGNOSIS — E559 Vitamin D deficiency, unspecified: Secondary | ICD-10-CM

## 2016-12-16 DIAGNOSIS — J432 Centrilobular emphysema: Secondary | ICD-10-CM | POA: Diagnosis not present

## 2016-12-16 DIAGNOSIS — E538 Deficiency of other specified B group vitamins: Secondary | ICD-10-CM | POA: Diagnosis not present

## 2016-12-16 DIAGNOSIS — G40209 Localization-related (focal) (partial) symptomatic epilepsy and epileptic syndromes with complex partial seizures, not intractable, without status epilepticus: Secondary | ICD-10-CM | POA: Diagnosis not present

## 2016-12-16 DIAGNOSIS — D839 Common variable immunodeficiency, unspecified: Secondary | ICD-10-CM | POA: Diagnosis not present

## 2016-12-16 DIAGNOSIS — Z79899 Other long term (current) drug therapy: Secondary | ICD-10-CM | POA: Diagnosis not present

## 2016-12-16 DIAGNOSIS — J310 Chronic rhinitis: Secondary | ICD-10-CM

## 2016-12-16 HISTORY — DX: Chronic rhinitis: J31.0

## 2016-12-16 HISTORY — DX: Chronic gout, unspecified, without tophus (tophi): M1A.9XX0

## 2016-12-16 HISTORY — DX: Vitamin D deficiency, unspecified: E55.9

## 2016-12-16 HISTORY — DX: Atherosclerotic heart disease of native coronary artery with other forms of angina pectoris: I25.118

## 2016-12-16 HISTORY — DX: Gastro-esophageal reflux disease without esophagitis: K21.9

## 2016-12-20 ENCOUNTER — Telehealth: Payer: Self-pay | Admitting: Allergy

## 2016-12-20 DIAGNOSIS — J189 Pneumonia, unspecified organism: Secondary | ICD-10-CM | POA: Diagnosis not present

## 2016-12-20 DIAGNOSIS — E119 Type 2 diabetes mellitus without complications: Secondary | ICD-10-CM | POA: Diagnosis not present

## 2016-12-20 DIAGNOSIS — M961 Postlaminectomy syndrome, not elsewhere classified: Secondary | ICD-10-CM | POA: Diagnosis not present

## 2016-12-20 DIAGNOSIS — Z4789 Encounter for other orthopedic aftercare: Secondary | ICD-10-CM | POA: Diagnosis not present

## 2016-12-20 DIAGNOSIS — J440 Chronic obstructive pulmonary disease with acute lower respiratory infection: Secondary | ICD-10-CM | POA: Diagnosis not present

## 2016-12-20 DIAGNOSIS — A419 Sepsis, unspecified organism: Secondary | ICD-10-CM | POA: Diagnosis not present

## 2016-12-20 NOTE — Telephone Encounter (Signed)
Kathy Moss's mom called in and wanted to know what to do about the IGG testing.  She stated Kathy Moss went on 3/28 and they did the IGG testing way too fast.  This caused her to have seizures, her heart rate dropped, and her blood pressure became really high.  Mom just wants to know what her next step should be.  Kathy Moss has an appointment on 7/24 at 3pm

## 2016-12-21 ENCOUNTER — Ambulatory Visit: Payer: Medicare Other | Admitting: Allergy

## 2016-12-21 DIAGNOSIS — M4716 Other spondylosis with myelopathy, lumbar region: Secondary | ICD-10-CM | POA: Diagnosis not present

## 2016-12-21 DIAGNOSIS — Z4689 Encounter for fitting and adjustment of other specified devices: Secondary | ICD-10-CM | POA: Diagnosis not present

## 2016-12-21 NOTE — Telephone Encounter (Signed)
Called patient partner [name] and she advised 2 days post surgery started IGG Carimmune at a flow of 2 and was going well and nurse increased to flow of 4 to try and get done sooner and not long after patient had seizure (has history of seizures) and BP kept dropping down to 39/20 and heartrate decreased they rushed her to ICU and stayed 2 days. Patient has no memory of event. She also advised they really didn't tell her what caused the events whether it was reaction or other.  I advised [name] I would give you info and also she needs to be scheduled for next infusion at [facility] due today. She is to see surgeon today for postop check.

## 2016-12-21 NOTE — Telephone Encounter (Signed)
Called partner [name] and advised Dr Nelva Bush states possible allergic reaction will put this on list of allergies.  Appt set up for next infusion Tues May 1 at 900 am and partner advised

## 2016-12-21 NOTE — Telephone Encounter (Signed)
Thanks Tammy.  The symptoms of hypotension, bradycardia do seem c/w with allergic reaction.  Unclear of the seizure cause however could be brought on due to severe hypotension.   I would list Carimmune as allergy going forward.   She has tolerated previous IGG infusions and would resume her regular monthly infusions with premedication and slower rate of infusion.

## 2016-12-21 NOTE — Telephone Encounter (Signed)
Can you get more information on what happened with this?

## 2016-12-22 DIAGNOSIS — J440 Chronic obstructive pulmonary disease with acute lower respiratory infection: Secondary | ICD-10-CM | POA: Diagnosis not present

## 2016-12-22 DIAGNOSIS — M961 Postlaminectomy syndrome, not elsewhere classified: Secondary | ICD-10-CM | POA: Diagnosis not present

## 2016-12-22 DIAGNOSIS — A419 Sepsis, unspecified organism: Secondary | ICD-10-CM | POA: Diagnosis not present

## 2016-12-22 DIAGNOSIS — Z4789 Encounter for other orthopedic aftercare: Secondary | ICD-10-CM | POA: Diagnosis not present

## 2016-12-22 DIAGNOSIS — J189 Pneumonia, unspecified organism: Secondary | ICD-10-CM | POA: Diagnosis not present

## 2016-12-22 DIAGNOSIS — E119 Type 2 diabetes mellitus without complications: Secondary | ICD-10-CM | POA: Diagnosis not present

## 2016-12-24 DIAGNOSIS — E119 Type 2 diabetes mellitus without complications: Secondary | ICD-10-CM | POA: Diagnosis not present

## 2016-12-24 DIAGNOSIS — J189 Pneumonia, unspecified organism: Secondary | ICD-10-CM | POA: Diagnosis not present

## 2016-12-24 DIAGNOSIS — Z4789 Encounter for other orthopedic aftercare: Secondary | ICD-10-CM | POA: Diagnosis not present

## 2016-12-24 DIAGNOSIS — M961 Postlaminectomy syndrome, not elsewhere classified: Secondary | ICD-10-CM | POA: Diagnosis not present

## 2016-12-24 DIAGNOSIS — A419 Sepsis, unspecified organism: Secondary | ICD-10-CM | POA: Diagnosis not present

## 2016-12-24 DIAGNOSIS — J440 Chronic obstructive pulmonary disease with acute lower respiratory infection: Secondary | ICD-10-CM | POA: Diagnosis not present

## 2016-12-27 DIAGNOSIS — R001 Bradycardia, unspecified: Secondary | ICD-10-CM | POA: Diagnosis not present

## 2016-12-27 DIAGNOSIS — I950 Idiopathic hypotension: Secondary | ICD-10-CM | POA: Diagnosis not present

## 2016-12-27 DIAGNOSIS — R0789 Other chest pain: Secondary | ICD-10-CM | POA: Diagnosis not present

## 2016-12-27 DIAGNOSIS — E7800 Pure hypercholesterolemia, unspecified: Secondary | ICD-10-CM | POA: Diagnosis not present

## 2016-12-27 DIAGNOSIS — F17200 Nicotine dependence, unspecified, uncomplicated: Secondary | ICD-10-CM | POA: Diagnosis not present

## 2016-12-28 DIAGNOSIS — Z4789 Encounter for other orthopedic aftercare: Secondary | ICD-10-CM | POA: Diagnosis not present

## 2016-12-28 DIAGNOSIS — J440 Chronic obstructive pulmonary disease with acute lower respiratory infection: Secondary | ICD-10-CM | POA: Diagnosis not present

## 2016-12-28 DIAGNOSIS — A419 Sepsis, unspecified organism: Secondary | ICD-10-CM | POA: Diagnosis not present

## 2016-12-28 DIAGNOSIS — M961 Postlaminectomy syndrome, not elsewhere classified: Secondary | ICD-10-CM | POA: Diagnosis not present

## 2016-12-28 DIAGNOSIS — J189 Pneumonia, unspecified organism: Secondary | ICD-10-CM | POA: Diagnosis not present

## 2016-12-28 DIAGNOSIS — E119 Type 2 diabetes mellitus without complications: Secondary | ICD-10-CM | POA: Diagnosis not present

## 2016-12-30 DIAGNOSIS — A419 Sepsis, unspecified organism: Secondary | ICD-10-CM | POA: Diagnosis not present

## 2016-12-30 DIAGNOSIS — M961 Postlaminectomy syndrome, not elsewhere classified: Secondary | ICD-10-CM | POA: Diagnosis not present

## 2016-12-30 DIAGNOSIS — Z4789 Encounter for other orthopedic aftercare: Secondary | ICD-10-CM | POA: Diagnosis not present

## 2016-12-30 DIAGNOSIS — J189 Pneumonia, unspecified organism: Secondary | ICD-10-CM | POA: Diagnosis not present

## 2016-12-30 DIAGNOSIS — E119 Type 2 diabetes mellitus without complications: Secondary | ICD-10-CM | POA: Diagnosis not present

## 2016-12-30 DIAGNOSIS — J440 Chronic obstructive pulmonary disease with acute lower respiratory infection: Secondary | ICD-10-CM | POA: Diagnosis not present

## 2016-12-31 DIAGNOSIS — D839 Common variable immunodeficiency, unspecified: Secondary | ICD-10-CM | POA: Diagnosis not present

## 2017-01-03 DIAGNOSIS — Z4789 Encounter for other orthopedic aftercare: Secondary | ICD-10-CM | POA: Diagnosis not present

## 2017-01-03 DIAGNOSIS — E119 Type 2 diabetes mellitus without complications: Secondary | ICD-10-CM | POA: Diagnosis not present

## 2017-01-03 DIAGNOSIS — A419 Sepsis, unspecified organism: Secondary | ICD-10-CM | POA: Diagnosis not present

## 2017-01-03 DIAGNOSIS — J440 Chronic obstructive pulmonary disease with acute lower respiratory infection: Secondary | ICD-10-CM | POA: Diagnosis not present

## 2017-01-03 DIAGNOSIS — M961 Postlaminectomy syndrome, not elsewhere classified: Secondary | ICD-10-CM | POA: Diagnosis not present

## 2017-01-03 DIAGNOSIS — J189 Pneumonia, unspecified organism: Secondary | ICD-10-CM | POA: Diagnosis not present

## 2017-01-04 DIAGNOSIS — J440 Chronic obstructive pulmonary disease with acute lower respiratory infection: Secondary | ICD-10-CM | POA: Diagnosis not present

## 2017-01-04 DIAGNOSIS — J189 Pneumonia, unspecified organism: Secondary | ICD-10-CM | POA: Diagnosis not present

## 2017-01-04 DIAGNOSIS — M961 Postlaminectomy syndrome, not elsewhere classified: Secondary | ICD-10-CM | POA: Diagnosis not present

## 2017-01-04 DIAGNOSIS — Z4789 Encounter for other orthopedic aftercare: Secondary | ICD-10-CM | POA: Diagnosis not present

## 2017-01-04 DIAGNOSIS — E119 Type 2 diabetes mellitus without complications: Secondary | ICD-10-CM | POA: Diagnosis not present

## 2017-01-04 DIAGNOSIS — A419 Sepsis, unspecified organism: Secondary | ICD-10-CM | POA: Diagnosis not present

## 2017-01-06 DIAGNOSIS — J440 Chronic obstructive pulmonary disease with acute lower respiratory infection: Secondary | ICD-10-CM | POA: Diagnosis not present

## 2017-01-06 DIAGNOSIS — A419 Sepsis, unspecified organism: Secondary | ICD-10-CM | POA: Diagnosis not present

## 2017-01-06 DIAGNOSIS — J189 Pneumonia, unspecified organism: Secondary | ICD-10-CM | POA: Diagnosis not present

## 2017-01-06 DIAGNOSIS — E119 Type 2 diabetes mellitus without complications: Secondary | ICD-10-CM | POA: Diagnosis not present

## 2017-01-06 DIAGNOSIS — Z4789 Encounter for other orthopedic aftercare: Secondary | ICD-10-CM | POA: Diagnosis not present

## 2017-01-06 DIAGNOSIS — M961 Postlaminectomy syndrome, not elsewhere classified: Secondary | ICD-10-CM | POA: Diagnosis not present

## 2017-01-10 DIAGNOSIS — E860 Dehydration: Secondary | ICD-10-CM | POA: Diagnosis not present

## 2017-01-10 DIAGNOSIS — R112 Nausea with vomiting, unspecified: Secondary | ICD-10-CM | POA: Diagnosis not present

## 2017-01-10 DIAGNOSIS — R197 Diarrhea, unspecified: Secondary | ICD-10-CM | POA: Diagnosis not present

## 2017-01-11 DIAGNOSIS — R197 Diarrhea, unspecified: Secondary | ICD-10-CM | POA: Diagnosis not present

## 2017-01-12 DIAGNOSIS — R112 Nausea with vomiting, unspecified: Secondary | ICD-10-CM | POA: Diagnosis not present

## 2017-01-12 DIAGNOSIS — Z7982 Long term (current) use of aspirin: Secondary | ICD-10-CM | POA: Diagnosis not present

## 2017-01-12 DIAGNOSIS — K219 Gastro-esophageal reflux disease without esophagitis: Secondary | ICD-10-CM | POA: Diagnosis not present

## 2017-01-12 DIAGNOSIS — Z79899 Other long term (current) drug therapy: Secondary | ICD-10-CM | POA: Diagnosis not present

## 2017-01-12 DIAGNOSIS — I10 Essential (primary) hypertension: Secondary | ICD-10-CM | POA: Diagnosis not present

## 2017-01-12 DIAGNOSIS — E860 Dehydration: Secondary | ICD-10-CM | POA: Diagnosis not present

## 2017-01-12 DIAGNOSIS — R197 Diarrhea, unspecified: Secondary | ICD-10-CM | POA: Diagnosis not present

## 2017-01-12 DIAGNOSIS — J449 Chronic obstructive pulmonary disease, unspecified: Secondary | ICD-10-CM | POA: Diagnosis not present

## 2017-01-12 DIAGNOSIS — I4891 Unspecified atrial fibrillation: Secondary | ICD-10-CM | POA: Diagnosis not present

## 2017-01-12 DIAGNOSIS — Z7952 Long term (current) use of systemic steroids: Secondary | ICD-10-CM | POA: Diagnosis not present

## 2017-01-12 DIAGNOSIS — F17210 Nicotine dependence, cigarettes, uncomplicated: Secondary | ICD-10-CM | POA: Diagnosis not present

## 2017-01-17 DIAGNOSIS — D839 Common variable immunodeficiency, unspecified: Secondary | ICD-10-CM | POA: Diagnosis not present

## 2017-01-17 DIAGNOSIS — K219 Gastro-esophageal reflux disease without esophagitis: Secondary | ICD-10-CM | POA: Diagnosis not present

## 2017-01-17 DIAGNOSIS — Q453 Other congenital malformations of pancreas and pancreatic duct: Secondary | ICD-10-CM | POA: Diagnosis not present

## 2017-01-17 DIAGNOSIS — R197 Diarrhea, unspecified: Secondary | ICD-10-CM | POA: Diagnosis not present

## 2017-01-17 DIAGNOSIS — R1010 Upper abdominal pain, unspecified: Secondary | ICD-10-CM | POA: Diagnosis not present

## 2017-01-17 DIAGNOSIS — E119 Type 2 diabetes mellitus without complications: Secondary | ICD-10-CM | POA: Diagnosis not present

## 2017-01-18 DIAGNOSIS — Z4789 Encounter for other orthopedic aftercare: Secondary | ICD-10-CM | POA: Diagnosis not present

## 2017-01-18 DIAGNOSIS — J440 Chronic obstructive pulmonary disease with acute lower respiratory infection: Secondary | ICD-10-CM | POA: Diagnosis not present

## 2017-01-18 DIAGNOSIS — M961 Postlaminectomy syndrome, not elsewhere classified: Secondary | ICD-10-CM | POA: Diagnosis not present

## 2017-01-18 DIAGNOSIS — A419 Sepsis, unspecified organism: Secondary | ICD-10-CM | POA: Diagnosis not present

## 2017-01-18 DIAGNOSIS — E119 Type 2 diabetes mellitus without complications: Secondary | ICD-10-CM | POA: Diagnosis not present

## 2017-01-18 DIAGNOSIS — J189 Pneumonia, unspecified organism: Secondary | ICD-10-CM | POA: Diagnosis not present

## 2017-01-19 DIAGNOSIS — E119 Type 2 diabetes mellitus without complications: Secondary | ICD-10-CM | POA: Diagnosis not present

## 2017-01-19 DIAGNOSIS — J440 Chronic obstructive pulmonary disease with acute lower respiratory infection: Secondary | ICD-10-CM | POA: Diagnosis not present

## 2017-01-19 DIAGNOSIS — A419 Sepsis, unspecified organism: Secondary | ICD-10-CM | POA: Diagnosis not present

## 2017-01-19 DIAGNOSIS — J189 Pneumonia, unspecified organism: Secondary | ICD-10-CM | POA: Diagnosis not present

## 2017-01-19 DIAGNOSIS — Z4789 Encounter for other orthopedic aftercare: Secondary | ICD-10-CM | POA: Diagnosis not present

## 2017-01-19 DIAGNOSIS — M961 Postlaminectomy syndrome, not elsewhere classified: Secondary | ICD-10-CM | POA: Diagnosis not present

## 2017-01-20 DIAGNOSIS — Z9049 Acquired absence of other specified parts of digestive tract: Secondary | ICD-10-CM | POA: Diagnosis not present

## 2017-01-20 DIAGNOSIS — R197 Diarrhea, unspecified: Secondary | ICD-10-CM | POA: Diagnosis not present

## 2017-01-20 DIAGNOSIS — R1010 Upper abdominal pain, unspecified: Secondary | ICD-10-CM | POA: Diagnosis not present

## 2017-01-27 DIAGNOSIS — M961 Postlaminectomy syndrome, not elsewhere classified: Secondary | ICD-10-CM | POA: Diagnosis not present

## 2017-01-27 DIAGNOSIS — E7800 Pure hypercholesterolemia, unspecified: Secondary | ICD-10-CM | POA: Diagnosis not present

## 2017-01-27 DIAGNOSIS — K591 Functional diarrhea: Secondary | ICD-10-CM | POA: Diagnosis not present

## 2017-01-27 DIAGNOSIS — K5900 Constipation, unspecified: Secondary | ICD-10-CM | POA: Diagnosis not present

## 2017-01-27 DIAGNOSIS — K648 Other hemorrhoids: Secondary | ICD-10-CM | POA: Diagnosis not present

## 2017-01-27 DIAGNOSIS — R0789 Other chest pain: Secondary | ICD-10-CM | POA: Diagnosis not present

## 2017-01-27 DIAGNOSIS — F17200 Nicotine dependence, unspecified, uncomplicated: Secondary | ICD-10-CM | POA: Diagnosis not present

## 2017-01-27 DIAGNOSIS — J440 Chronic obstructive pulmonary disease with acute lower respiratory infection: Secondary | ICD-10-CM | POA: Diagnosis not present

## 2017-01-27 DIAGNOSIS — R001 Bradycardia, unspecified: Secondary | ICD-10-CM | POA: Diagnosis not present

## 2017-01-27 DIAGNOSIS — J189 Pneumonia, unspecified organism: Secondary | ICD-10-CM | POA: Diagnosis not present

## 2017-01-27 DIAGNOSIS — E119 Type 2 diabetes mellitus without complications: Secondary | ICD-10-CM | POA: Diagnosis not present

## 2017-01-27 DIAGNOSIS — Z4789 Encounter for other orthopedic aftercare: Secondary | ICD-10-CM | POA: Diagnosis not present

## 2017-01-27 DIAGNOSIS — A419 Sepsis, unspecified organism: Secondary | ICD-10-CM | POA: Diagnosis not present

## 2017-01-27 DIAGNOSIS — G4733 Obstructive sleep apnea (adult) (pediatric): Secondary | ICD-10-CM | POA: Diagnosis not present

## 2017-01-28 ENCOUNTER — Encounter: Payer: Self-pay | Admitting: Cardiology

## 2017-01-28 DIAGNOSIS — G4733 Obstructive sleep apnea (adult) (pediatric): Secondary | ICD-10-CM | POA: Diagnosis not present

## 2017-01-28 DIAGNOSIS — R001 Bradycardia, unspecified: Secondary | ICD-10-CM | POA: Diagnosis not present

## 2017-01-28 DIAGNOSIS — D839 Common variable immunodeficiency, unspecified: Secondary | ICD-10-CM | POA: Diagnosis not present

## 2017-01-28 DIAGNOSIS — R0789 Other chest pain: Secondary | ICD-10-CM | POA: Diagnosis not present

## 2017-01-28 DIAGNOSIS — E7800 Pure hypercholesterolemia, unspecified: Secondary | ICD-10-CM | POA: Diagnosis not present

## 2017-01-28 DIAGNOSIS — K921 Melena: Secondary | ICD-10-CM | POA: Diagnosis not present

## 2017-01-28 DIAGNOSIS — F17200 Nicotine dependence, unspecified, uncomplicated: Secondary | ICD-10-CM | POA: Diagnosis not present

## 2017-02-01 DIAGNOSIS — D839 Common variable immunodeficiency, unspecified: Secondary | ICD-10-CM | POA: Diagnosis not present

## 2017-02-01 DIAGNOSIS — E119 Type 2 diabetes mellitus without complications: Secondary | ICD-10-CM | POA: Diagnosis not present

## 2017-02-01 DIAGNOSIS — R2689 Other abnormalities of gait and mobility: Secondary | ICD-10-CM | POA: Diagnosis not present

## 2017-02-01 DIAGNOSIS — Z794 Long term (current) use of insulin: Secondary | ICD-10-CM | POA: Diagnosis not present

## 2017-02-01 DIAGNOSIS — F17210 Nicotine dependence, cigarettes, uncomplicated: Secondary | ICD-10-CM | POA: Diagnosis not present

## 2017-02-01 DIAGNOSIS — Z9181 History of falling: Secondary | ICD-10-CM | POA: Diagnosis not present

## 2017-02-01 DIAGNOSIS — Z792 Long term (current) use of antibiotics: Secondary | ICD-10-CM | POA: Diagnosis not present

## 2017-02-01 DIAGNOSIS — Z9981 Dependence on supplemental oxygen: Secondary | ICD-10-CM | POA: Diagnosis not present

## 2017-02-01 DIAGNOSIS — I950 Idiopathic hypotension: Secondary | ICD-10-CM | POA: Diagnosis not present

## 2017-02-01 DIAGNOSIS — M5136 Other intervertebral disc degeneration, lumbar region: Secondary | ICD-10-CM | POA: Diagnosis not present

## 2017-02-01 DIAGNOSIS — J449 Chronic obstructive pulmonary disease, unspecified: Secondary | ICD-10-CM | POA: Diagnosis not present

## 2017-02-01 DIAGNOSIS — G40211 Localization-related (focal) (partial) symptomatic epilepsy and epileptic syndromes with complex partial seizures, intractable, with status epilepticus: Secondary | ICD-10-CM | POA: Diagnosis not present

## 2017-02-01 DIAGNOSIS — Z79891 Long term (current) use of opiate analgesic: Secondary | ICD-10-CM | POA: Diagnosis not present

## 2017-02-01 DIAGNOSIS — G4733 Obstructive sleep apnea (adult) (pediatric): Secondary | ICD-10-CM | POA: Diagnosis not present

## 2017-02-07 DIAGNOSIS — G40211 Localization-related (focal) (partial) symptomatic epilepsy and epileptic syndromes with complex partial seizures, intractable, with status epilepticus: Secondary | ICD-10-CM | POA: Diagnosis not present

## 2017-02-07 DIAGNOSIS — R0789 Other chest pain: Secondary | ICD-10-CM | POA: Diagnosis not present

## 2017-02-07 DIAGNOSIS — R2689 Other abnormalities of gait and mobility: Secondary | ICD-10-CM | POA: Diagnosis not present

## 2017-02-07 DIAGNOSIS — J449 Chronic obstructive pulmonary disease, unspecified: Secondary | ICD-10-CM | POA: Diagnosis not present

## 2017-02-07 DIAGNOSIS — M5136 Other intervertebral disc degeneration, lumbar region: Secondary | ICD-10-CM | POA: Diagnosis not present

## 2017-02-07 DIAGNOSIS — G4733 Obstructive sleep apnea (adult) (pediatric): Secondary | ICD-10-CM | POA: Diagnosis not present

## 2017-02-07 DIAGNOSIS — E119 Type 2 diabetes mellitus without complications: Secondary | ICD-10-CM | POA: Diagnosis not present

## 2017-02-08 DIAGNOSIS — F17200 Nicotine dependence, unspecified, uncomplicated: Secondary | ICD-10-CM | POA: Diagnosis not present

## 2017-02-08 DIAGNOSIS — R0789 Other chest pain: Secondary | ICD-10-CM | POA: Diagnosis not present

## 2017-02-08 DIAGNOSIS — E7800 Pure hypercholesterolemia, unspecified: Secondary | ICD-10-CM | POA: Diagnosis not present

## 2017-02-08 DIAGNOSIS — M79671 Pain in right foot: Secondary | ICD-10-CM | POA: Diagnosis not present

## 2017-02-08 DIAGNOSIS — M79672 Pain in left foot: Secondary | ICD-10-CM | POA: Diagnosis not present

## 2017-02-08 DIAGNOSIS — E119 Type 2 diabetes mellitus without complications: Secondary | ICD-10-CM | POA: Diagnosis not present

## 2017-02-09 DIAGNOSIS — E7800 Pure hypercholesterolemia, unspecified: Secondary | ICD-10-CM | POA: Diagnosis not present

## 2017-02-09 DIAGNOSIS — R0789 Other chest pain: Secondary | ICD-10-CM | POA: Diagnosis not present

## 2017-02-09 DIAGNOSIS — F17200 Nicotine dependence, unspecified, uncomplicated: Secondary | ICD-10-CM | POA: Diagnosis not present

## 2017-02-09 DIAGNOSIS — G4733 Obstructive sleep apnea (adult) (pediatric): Secondary | ICD-10-CM | POA: Diagnosis not present

## 2017-02-09 DIAGNOSIS — R001 Bradycardia, unspecified: Secondary | ICD-10-CM | POA: Diagnosis not present

## 2017-02-14 DIAGNOSIS — G4733 Obstructive sleep apnea (adult) (pediatric): Secondary | ICD-10-CM | POA: Diagnosis not present

## 2017-02-14 DIAGNOSIS — M5136 Other intervertebral disc degeneration, lumbar region: Secondary | ICD-10-CM | POA: Diagnosis not present

## 2017-02-14 DIAGNOSIS — R2689 Other abnormalities of gait and mobility: Secondary | ICD-10-CM | POA: Diagnosis not present

## 2017-02-14 DIAGNOSIS — E119 Type 2 diabetes mellitus without complications: Secondary | ICD-10-CM | POA: Diagnosis not present

## 2017-02-14 DIAGNOSIS — G40211 Localization-related (focal) (partial) symptomatic epilepsy and epileptic syndromes with complex partial seizures, intractable, with status epilepticus: Secondary | ICD-10-CM | POA: Diagnosis not present

## 2017-02-14 DIAGNOSIS — J449 Chronic obstructive pulmonary disease, unspecified: Secondary | ICD-10-CM | POA: Diagnosis not present

## 2017-02-15 DIAGNOSIS — M5136 Other intervertebral disc degeneration, lumbar region: Secondary | ICD-10-CM | POA: Diagnosis not present

## 2017-02-15 DIAGNOSIS — J449 Chronic obstructive pulmonary disease, unspecified: Secondary | ICD-10-CM | POA: Diagnosis not present

## 2017-02-15 DIAGNOSIS — E119 Type 2 diabetes mellitus without complications: Secondary | ICD-10-CM | POA: Diagnosis not present

## 2017-02-15 DIAGNOSIS — R2689 Other abnormalities of gait and mobility: Secondary | ICD-10-CM | POA: Diagnosis not present

## 2017-02-15 DIAGNOSIS — G40211 Localization-related (focal) (partial) symptomatic epilepsy and epileptic syndromes with complex partial seizures, intractable, with status epilepticus: Secondary | ICD-10-CM | POA: Diagnosis not present

## 2017-02-15 DIAGNOSIS — G4733 Obstructive sleep apnea (adult) (pediatric): Secondary | ICD-10-CM | POA: Diagnosis not present

## 2017-02-16 DIAGNOSIS — G40211 Localization-related (focal) (partial) symptomatic epilepsy and epileptic syndromes with complex partial seizures, intractable, with status epilepticus: Secondary | ICD-10-CM | POA: Diagnosis not present

## 2017-02-16 DIAGNOSIS — E119 Type 2 diabetes mellitus without complications: Secondary | ICD-10-CM | POA: Diagnosis not present

## 2017-02-16 DIAGNOSIS — J449 Chronic obstructive pulmonary disease, unspecified: Secondary | ICD-10-CM | POA: Diagnosis not present

## 2017-02-16 DIAGNOSIS — G4733 Obstructive sleep apnea (adult) (pediatric): Secondary | ICD-10-CM | POA: Diagnosis not present

## 2017-02-16 DIAGNOSIS — R2689 Other abnormalities of gait and mobility: Secondary | ICD-10-CM | POA: Diagnosis not present

## 2017-02-16 DIAGNOSIS — M5136 Other intervertebral disc degeneration, lumbar region: Secondary | ICD-10-CM | POA: Diagnosis not present

## 2017-02-18 DIAGNOSIS — F411 Generalized anxiety disorder: Secondary | ICD-10-CM | POA: Diagnosis not present

## 2017-02-18 DIAGNOSIS — G2581 Restless legs syndrome: Secondary | ICD-10-CM | POA: Diagnosis not present

## 2017-02-18 DIAGNOSIS — J310 Chronic rhinitis: Secondary | ICD-10-CM | POA: Diagnosis not present

## 2017-02-18 DIAGNOSIS — R5383 Other fatigue: Secondary | ICD-10-CM | POA: Diagnosis not present

## 2017-02-18 DIAGNOSIS — J4530 Mild persistent asthma, uncomplicated: Secondary | ICD-10-CM | POA: Diagnosis not present

## 2017-02-21 DIAGNOSIS — E119 Type 2 diabetes mellitus without complications: Secondary | ICD-10-CM | POA: Diagnosis not present

## 2017-02-21 DIAGNOSIS — R2689 Other abnormalities of gait and mobility: Secondary | ICD-10-CM | POA: Diagnosis not present

## 2017-02-21 DIAGNOSIS — G4733 Obstructive sleep apnea (adult) (pediatric): Secondary | ICD-10-CM | POA: Diagnosis not present

## 2017-02-21 DIAGNOSIS — G40211 Localization-related (focal) (partial) symptomatic epilepsy and epileptic syndromes with complex partial seizures, intractable, with status epilepticus: Secondary | ICD-10-CM | POA: Diagnosis not present

## 2017-02-21 DIAGNOSIS — J449 Chronic obstructive pulmonary disease, unspecified: Secondary | ICD-10-CM | POA: Diagnosis not present

## 2017-02-21 DIAGNOSIS — M5136 Other intervertebral disc degeneration, lumbar region: Secondary | ICD-10-CM | POA: Diagnosis not present

## 2017-02-22 DIAGNOSIS — J449 Chronic obstructive pulmonary disease, unspecified: Secondary | ICD-10-CM | POA: Diagnosis not present

## 2017-02-22 DIAGNOSIS — G40211 Localization-related (focal) (partial) symptomatic epilepsy and epileptic syndromes with complex partial seizures, intractable, with status epilepticus: Secondary | ICD-10-CM | POA: Diagnosis not present

## 2017-02-22 DIAGNOSIS — R2689 Other abnormalities of gait and mobility: Secondary | ICD-10-CM | POA: Diagnosis not present

## 2017-02-22 DIAGNOSIS — E042 Nontoxic multinodular goiter: Secondary | ICD-10-CM | POA: Diagnosis not present

## 2017-02-22 DIAGNOSIS — E119 Type 2 diabetes mellitus without complications: Secondary | ICD-10-CM | POA: Diagnosis not present

## 2017-02-22 DIAGNOSIS — E041 Nontoxic single thyroid nodule: Secondary | ICD-10-CM | POA: Diagnosis not present

## 2017-02-22 DIAGNOSIS — M5136 Other intervertebral disc degeneration, lumbar region: Secondary | ICD-10-CM | POA: Diagnosis not present

## 2017-02-22 DIAGNOSIS — G4733 Obstructive sleep apnea (adult) (pediatric): Secondary | ICD-10-CM | POA: Diagnosis not present

## 2017-02-22 DIAGNOSIS — I10 Essential (primary) hypertension: Secondary | ICD-10-CM | POA: Diagnosis not present

## 2017-02-23 DIAGNOSIS — M7061 Trochanteric bursitis, right hip: Secondary | ICD-10-CM | POA: Diagnosis not present

## 2017-02-23 DIAGNOSIS — M961 Postlaminectomy syndrome, not elsewhere classified: Secondary | ICD-10-CM | POA: Diagnosis not present

## 2017-02-23 DIAGNOSIS — M4326 Fusion of spine, lumbar region: Secondary | ICD-10-CM | POA: Diagnosis not present

## 2017-02-23 DIAGNOSIS — M5106 Intervertebral disc disorders with myelopathy, lumbar region: Secondary | ICD-10-CM | POA: Diagnosis not present

## 2017-02-23 DIAGNOSIS — M545 Low back pain: Secondary | ICD-10-CM | POA: Diagnosis not present

## 2017-02-23 DIAGNOSIS — G894 Chronic pain syndrome: Secondary | ICD-10-CM | POA: Diagnosis not present

## 2017-02-28 DIAGNOSIS — E119 Type 2 diabetes mellitus without complications: Secondary | ICD-10-CM | POA: Diagnosis not present

## 2017-02-28 DIAGNOSIS — G40211 Localization-related (focal) (partial) symptomatic epilepsy and epileptic syndromes with complex partial seizures, intractable, with status epilepticus: Secondary | ICD-10-CM | POA: Diagnosis not present

## 2017-02-28 DIAGNOSIS — J449 Chronic obstructive pulmonary disease, unspecified: Secondary | ICD-10-CM | POA: Diagnosis not present

## 2017-02-28 DIAGNOSIS — R2689 Other abnormalities of gait and mobility: Secondary | ICD-10-CM | POA: Diagnosis not present

## 2017-02-28 DIAGNOSIS — G4733 Obstructive sleep apnea (adult) (pediatric): Secondary | ICD-10-CM | POA: Diagnosis not present

## 2017-02-28 DIAGNOSIS — M5136 Other intervertebral disc degeneration, lumbar region: Secondary | ICD-10-CM | POA: Diagnosis not present

## 2017-03-01 DIAGNOSIS — E119 Type 2 diabetes mellitus without complications: Secondary | ICD-10-CM | POA: Diagnosis not present

## 2017-03-01 DIAGNOSIS — R2689 Other abnormalities of gait and mobility: Secondary | ICD-10-CM | POA: Diagnosis not present

## 2017-03-01 DIAGNOSIS — J449 Chronic obstructive pulmonary disease, unspecified: Secondary | ICD-10-CM | POA: Diagnosis not present

## 2017-03-01 DIAGNOSIS — M5136 Other intervertebral disc degeneration, lumbar region: Secondary | ICD-10-CM | POA: Diagnosis not present

## 2017-03-01 DIAGNOSIS — G40211 Localization-related (focal) (partial) symptomatic epilepsy and epileptic syndromes with complex partial seizures, intractable, with status epilepticus: Secondary | ICD-10-CM | POA: Diagnosis not present

## 2017-03-01 DIAGNOSIS — G4733 Obstructive sleep apnea (adult) (pediatric): Secondary | ICD-10-CM | POA: Diagnosis not present

## 2017-03-03 DIAGNOSIS — R2689 Other abnormalities of gait and mobility: Secondary | ICD-10-CM | POA: Diagnosis not present

## 2017-03-03 DIAGNOSIS — E119 Type 2 diabetes mellitus without complications: Secondary | ICD-10-CM | POA: Diagnosis not present

## 2017-03-03 DIAGNOSIS — G4733 Obstructive sleep apnea (adult) (pediatric): Secondary | ICD-10-CM | POA: Diagnosis not present

## 2017-03-03 DIAGNOSIS — G40211 Localization-related (focal) (partial) symptomatic epilepsy and epileptic syndromes with complex partial seizures, intractable, with status epilepticus: Secondary | ICD-10-CM | POA: Diagnosis not present

## 2017-03-03 DIAGNOSIS — J449 Chronic obstructive pulmonary disease, unspecified: Secondary | ICD-10-CM | POA: Diagnosis not present

## 2017-03-03 DIAGNOSIS — M5136 Other intervertebral disc degeneration, lumbar region: Secondary | ICD-10-CM | POA: Diagnosis not present

## 2017-03-04 ENCOUNTER — Other Ambulatory Visit: Payer: Self-pay

## 2017-03-04 DIAGNOSIS — G4733 Obstructive sleep apnea (adult) (pediatric): Secondary | ICD-10-CM | POA: Diagnosis not present

## 2017-03-04 DIAGNOSIS — E7800 Pure hypercholesterolemia, unspecified: Secondary | ICD-10-CM | POA: Diagnosis not present

## 2017-03-04 DIAGNOSIS — R001 Bradycardia, unspecified: Secondary | ICD-10-CM | POA: Diagnosis not present

## 2017-03-04 DIAGNOSIS — D839 Common variable immunodeficiency, unspecified: Secondary | ICD-10-CM | POA: Diagnosis not present

## 2017-03-04 DIAGNOSIS — F17200 Nicotine dependence, unspecified, uncomplicated: Secondary | ICD-10-CM | POA: Diagnosis not present

## 2017-03-04 DIAGNOSIS — K921 Melena: Secondary | ICD-10-CM | POA: Diagnosis not present

## 2017-03-04 DIAGNOSIS — R0789 Other chest pain: Secondary | ICD-10-CM | POA: Diagnosis not present

## 2017-03-04 MED ORDER — RANOLAZINE ER 500 MG PO TB12: 500.0000 mg | ORAL_TABLET | Freq: Two times a day (BID) | ORAL | 6 refills | Status: DC

## 2017-03-07 DIAGNOSIS — M5136 Other intervertebral disc degeneration, lumbar region: Secondary | ICD-10-CM | POA: Diagnosis not present

## 2017-03-07 DIAGNOSIS — J449 Chronic obstructive pulmonary disease, unspecified: Secondary | ICD-10-CM | POA: Diagnosis not present

## 2017-03-07 DIAGNOSIS — G4733 Obstructive sleep apnea (adult) (pediatric): Secondary | ICD-10-CM | POA: Diagnosis not present

## 2017-03-07 DIAGNOSIS — E119 Type 2 diabetes mellitus without complications: Secondary | ICD-10-CM | POA: Diagnosis not present

## 2017-03-07 DIAGNOSIS — G40211 Localization-related (focal) (partial) symptomatic epilepsy and epileptic syndromes with complex partial seizures, intractable, with status epilepticus: Secondary | ICD-10-CM | POA: Diagnosis not present

## 2017-03-07 DIAGNOSIS — R2689 Other abnormalities of gait and mobility: Secondary | ICD-10-CM | POA: Diagnosis not present

## 2017-03-08 DIAGNOSIS — E7800 Pure hypercholesterolemia, unspecified: Secondary | ICD-10-CM | POA: Diagnosis not present

## 2017-03-08 DIAGNOSIS — J439 Emphysema, unspecified: Secondary | ICD-10-CM | POA: Diagnosis not present

## 2017-03-08 DIAGNOSIS — R9439 Abnormal result of other cardiovascular function study: Secondary | ICD-10-CM

## 2017-03-08 DIAGNOSIS — R079 Chest pain, unspecified: Secondary | ICD-10-CM | POA: Diagnosis not present

## 2017-03-08 DIAGNOSIS — R0789 Other chest pain: Secondary | ICD-10-CM

## 2017-03-08 DIAGNOSIS — F17200 Nicotine dependence, unspecified, uncomplicated: Secondary | ICD-10-CM | POA: Diagnosis not present

## 2017-03-08 HISTORY — DX: Other chest pain: R07.89

## 2017-03-08 HISTORY — DX: Abnormal result of other cardiovascular function study: R94.39

## 2017-03-09 ENCOUNTER — Telehealth: Payer: Self-pay | Admitting: Cardiology

## 2017-03-09 DIAGNOSIS — E042 Nontoxic multinodular goiter: Secondary | ICD-10-CM | POA: Diagnosis not present

## 2017-03-09 DIAGNOSIS — I10 Essential (primary) hypertension: Secondary | ICD-10-CM | POA: Diagnosis not present

## 2017-03-09 DIAGNOSIS — E041 Nontoxic single thyroid nodule: Secondary | ICD-10-CM | POA: Diagnosis not present

## 2017-03-09 DIAGNOSIS — E119 Type 2 diabetes mellitus without complications: Secondary | ICD-10-CM | POA: Diagnosis not present

## 2017-03-09 NOTE — Telephone Encounter (Signed)
Saw Dr Otho Perl yesterday and he doubled her Ranexa but she wants to check with RJK about it/she has an appt with him 07/25

## 2017-03-09 NOTE — Telephone Encounter (Signed)
S/w spouse Kathy Moss and discussed medication concern. Asked pt to continue the increased dose of Ranexa until her f/u visit in couple of weeks.

## 2017-03-10 ENCOUNTER — Ambulatory Visit (INDEPENDENT_AMBULATORY_CARE_PROVIDER_SITE_OTHER): Payer: Medicare Other | Admitting: Cardiology

## 2017-03-10 ENCOUNTER — Encounter: Payer: Self-pay | Admitting: Cardiology

## 2017-03-10 VITALS — BP 140/78 | HR 76 | Resp 12 | Ht 65.0 in | Wt 197.4 lb

## 2017-03-10 DIAGNOSIS — K5900 Constipation, unspecified: Secondary | ICD-10-CM | POA: Diagnosis not present

## 2017-03-10 DIAGNOSIS — K648 Other hemorrhoids: Secondary | ICD-10-CM | POA: Diagnosis not present

## 2017-03-10 DIAGNOSIS — K591 Functional diarrhea: Secondary | ICD-10-CM | POA: Diagnosis not present

## 2017-03-10 DIAGNOSIS — Z01812 Encounter for preprocedural laboratory examination: Secondary | ICD-10-CM | POA: Diagnosis not present

## 2017-03-10 DIAGNOSIS — R079 Chest pain, unspecified: Secondary | ICD-10-CM | POA: Diagnosis not present

## 2017-03-10 HISTORY — DX: Chest pain, unspecified: R07.9

## 2017-03-10 NOTE — Patient Instructions (Addendum)
Medication Instructions:  Your physician recommends that you continue on your current medications as directed. Please refer to the Current Medication list given to you today.  Labwork: Your physician recommends that you have lab work today for your procedure on July 18th.   Testing/Procedures: Your physician has requested that you have a cardiac catheterization. Cardiac catheterization is used to diagnose and/or treat various heart conditions. Doctors may recommend this procedure for a number of different reasons. The most common reason is to evaluate chest pain. Chest pain can be a symptom of coronary artery disease (CAD), and cardiac catheterization can show whether plaque is narrowing or blocking your heart's arteries. This procedure is also used to evaluate the valves, as well as measure the blood flow and oxygen levels in different parts of your heart. For further information please visit [email or web address]. Please follow instruction sheet, as given.   Follow-Up: Your physician recommends that you schedule a follow-up appointment in: 3-4 weeks after heart cath.  [facility] Cardiac Cath Instructions   You are scheduled for a Cardiac Cath on:___July 18th, 2018_________  Please arrive at _6:30___am on the day of your procedure  Please expect a call from our [place] to pre-register you  Do not eat/drink anything after midnight  Someone will need to drive you home  It is recommended someone be with you for the first 24 hours after your procedure  Wear clothes that are easy to get on/off and wear slip on shoes if possible   Medications bring a current list of all medications with you  _x__ You may take all of your medications the morning of your procedure with enough water to swallow safely  _x__ Do not take these medications before your procedure: oral diabetic medications or take 1/2 the dose of insulin.    Day of your procedure: Arrive at the [facility] Entrance.  Free valet service is available.  After entering the [place] check-in at the registration desk to receive your armband. After receiving your armband someone will escort you to the cardiac cath/special procedures waiting area.  The usual length of stay after your procedure is about 2 to 3 hours.  This can vary.  If you have any questions, please call our office at [phone], or you may call the cardiac cath lab at [facility] directly at [phone]  Any Other Special Instructions Will Be Listed Below (If Applicable).     If you need a refill on your cardiac medications before your next appointment, please call your pharmacy.

## 2017-03-10 NOTE — Progress Notes (Signed)
Cardiology Office Note:    Date:  03/10/2017   ID:  Kathy Moss, DOB Apr 11, 1961, MRN [id number]  PCP:  Raina Mina., MD  Cardiologist:  Jenne Campus, MD    Referring MD: Raina Mina., MD   Chief Complaint  Patient presents with  . Discuss Cath and Renexa  I need cardiac catheterization  History of Present Illness:    Kathy Moss is a 56 y.o. female  with multiple medical problems. I be taking care of her for years. Used to been dealing with some atypical chest pain. Recently she did see Dr. Cristobal Goldmann in [place] office and he told her that she needs cardiac catheterization. She did not want to proceed with cardiac catheterization because she wanted my opinion regarding the patient. And frankly I think reached the point that the only way to answer the question if she has significant coronary artery disease would be to proceed with cardiac catheterization. Years ago she had a stress test which shows some minor abnormality only, she drinks producing very atypical chest pain for years with some partial relief with antianginal medications. She describes now worsening of chest pain remaining or frequent. However those pains are not related to exercise. Patient not relieved by rest. Again she takes some antianginal medication with partial relief of the pain. Cardiac catheterization was explained including all risks benefits as well as alternatives. She agreed to proceed.   Past Medical History:  Diagnosis Date  . Angina at rest [facility]) 2013  . Asthma   . Hypertension   . Recurrent upper respiratory infection (URI)   . Seizures ([place])   . Type 2 diabetes mellitus without complication, without long-term current use of insulin ([place]) 06/30/2016    Past Surgical History:  Procedure Laterality Date  . APPENDECTOMY    . BACK SURGERY    . CHOLECYSTECTOMY    . HERNIA REPAIR      Current Medications: Current Meds  Medication Sig  . albuterol (PROVENTIL HFA;VENTOLIN HFA) 108 (90 Base)  MCG/ACT inhaler Inhale 1-2 puffs into the lungs every 6 (six) hours as needed for wheezing or shortness of breath.  . ALPRAZolam (XANAX) 0.5 MG tablet Take 0.5 mg by mouth 3 (three) times daily as needed for anxiety.  Marland Kitchen aspirin EC 81 MG tablet Take 81 mg by mouth daily.  Marland Kitchen azelastine (ASTELIN) 0.1 % nasal spray Place 1 spray into both nostrils 2 (two) times daily. Use in each nostril as directed  . beclomethasone (QVAR) 80 MCG/ACT inhaler Inhale 1 puff into the lungs 2 (two) times daily.  . butalbital-acetaminophen-caffeine (FIORICET, ESGIC) 50-325-40 MG tablet TAKE 1 TABLET BY MOUTH EVERY 6 HOURS AS NEEDED FOR PAIN.  Marland Kitchen diltiazem (CARDIZEM LA) 120 MG 24 hr tablet Take 120 mg by mouth daily.   Marland Kitchen esomeprazole (NEXIUM) 40 MG capsule Take 40 mg by mouth 3 (three) times daily.   . Eszopiclone (ESZOPICLONE) 3 MG TABS Take 3 mg by mouth at bedtime. Take immediately before bedtime  . fluticasone (FLONASE) 50 MCG/ACT nasal spray Place 1 spray into both nostrils daily.  . fluticasone furoate-vilanterol (BREO ELLIPTA) 200-25 MCG/INH AEPB Inhale 1 puff into the lungs 2 (two) times daily.  Marland Kitchen gabapentin (NEURONTIN) 300 MG capsule Take 300 mg by mouth 3 (three) times daily.  Marland Kitchen lacosamide (VIMPAT) 200 MG TABS tablet Take 200 mg by mouth 2 (two) times daily.  Marland Kitchen lamoTRIgine (LAMICTAL) 25 MG tablet Take 25 mg by mouth 2 (two) times daily.  Marland Kitchen levETIRAcetam (KEPPRA)  500 MG tablet Take 500 mg by mouth 2 (two) times daily.  . nitroGLYCERIN (NITROSTAT) 0.4 MG SL tablet Place 0.4 mg under the tongue every 5 (five) minutes as needed for chest pain.  Marland Kitchen nystatin (MYCOSTATIN) 100000 UNIT/ML suspension Take 5 mLs by mouth 3 (three) times daily.  Marland Kitchen olmesartan (BENICAR) 20 MG tablet Take 20 mg by mouth daily.  . Omega-3 Fatty Acids (FISH OIL PO) Take 1,000 mg by mouth 4 (four) times daily.  Marland Kitchen oxyCODONE-acetaminophen (PERCOCET) 10-325 MG tablet Take 1 tablet by mouth every 4 (four) hours.  . promethazine (PHENERGAN) 25 MG tablet  Take 25 mg by mouth every 6 (six) hours as needed for nausea or vomiting.  . ranitidine (ZANTAC) 300 MG tablet Take 300 mg by mouth at bedtime.  . ranolazine (RANEXA) 500 MG 12 hr tablet Take 1 tablet (500 mg total) by mouth 2 (two) times daily.  Marland Kitchen rOPINIRole (REQUIP) 1 MG tablet Take 3 mg by mouth at bedtime.   . rosuvastatin (CRESTOR) 5 MG tablet Take 5 mg by mouth daily at 6 PM.  . sitaGLIPtin (JANUVIA) 25 MG tablet Take 25 mg by mouth 2 (two) times daily.  Marland Kitchen tiotropium (SPIRIVA) 18 MCG inhalation capsule Place 18 mcg into inhaler and inhale daily.  Marland Kitchen tiZANidine (ZANAFLEX) 4 MG capsule Take 4 mg by mouth 3 (three) times daily.  . Vitamin D, Ergocalciferol, (DRISDOL) 50000 units CAPS capsule Take 50,000 Units by mouth every Tuesday.      Allergies:   Carimune [immune globulin]; Imitrex [sumatriptan]; Morphine; Tramadol; Adhesive [tape]; Other; Amitriptyline; Buprenorphine hcl; Morphine and related; Sumatriptan succinate; and Tramadol-acetaminophen   Social History   Social History  . Marital status: Single    Spouse name: N/A  . Number of children: N/A  . Years of education: N/A   Social History Main Topics  . Smoking status: Current Every Day Smoker    Packs/day: 0.50    Years: 36.00    Types: Cigarettes  . Smokeless tobacco: Never Used  . Alcohol use No  . Drug use: No  . Sexual activity: Not Asked   Other Topics Concern  . None   Social History Narrative  . None     Family History: The patient's family history includes Allergic rhinitis in her brother; Asthma in her mother; COPD in her brother; High blood pressure in her mother; Lung cancer in her mother; Seizures in her mother; Stroke in her mother. ROS:   Please see the history of present illness.    All 14 point review of systems negative except as described per history of present illness  EKGs/Labs/Other Studies Reviewed:      Recent Labs: 07/20/2016: ALT 8; BUN 12; Creatinine, Ser 0.96; Hemoglobin 13.9;  Platelets 287; Potassium 4.4; Sodium 137  Recent Lipid Panel No results found for: CHOL, TRIG, HDL, CHOLHDL, VLDL, LDLCALC, LDLDIRECT  Physical Exam:    VS:  BP 140/78   Pulse 76   Resp 12   Ht 5\' 5"  (1.651 m)   Wt 197 lb 6.4 oz (89.5 kg)   BMI 32.85 kg/m     Wt Readings from Last 3 Encounters:  03/10/17 197 lb 6.4 oz (89.5 kg)  09/01/16 203 lb (92.1 kg)  07/20/16 208 lb (94.3 kg)     GEN:  Well nourished, well developed in no acute distress HEENT: Normal NECK: No JVD; No carotid bruits LYMPHATICS: No lymphadenopathy CARDIAC: RRR, no murmurs, no rubs, no gallops RESPIRATORY:  Clear to auscultation without rales,  wheezing or rhonchi  ABDOMEN: Soft, non-tender, non-distended MUSCULOSKELETAL:  No edema; No deformity  SKIN: Warm and dry LOWER EXTREMITIES: no swelling NEUROLOGIC:  Alert and oriented x 3 PSYCHIATRIC:  Normal affect   ASSESSMENT:    No diagnosis found. PLAN:    In order of problems listed above:  1. Atypical angina pectoris: Long discussion with her regarding cardiac catheterization. I really think that reached the point that the only way to clarify if she does have significant coronary artery disease he is to perform cardiac catheterization. Procedure was explained to her including all risks and benefits as well as alternatives will proceed. C tests will be done make sure we assess risk before procedure. 2. Smoking: COPD. She understands that this is a problem she used to smoke 2 packs per day she cut it down to half pack per day. Congratulated her for it and I strongly advised to completely stop. 3. Dyslipidemia: She is on Crestor which I will continue. She takes only small dose of this medication. She did have difficulty tolerating higher dosages. Will talk about needs of intensification of statin therapy after results of cardiac catheterization will be available. 4. Diabetes mellitus: Follow-up by primary care physician.  Medication Adjustments/Labs and  Tests Ordered: Current medicines are reviewed at length with the patient today.  Concerns regarding medicines are outlined above.  No orders of the defined types were placed in this encounter.  Medication changes: No orders of the defined types were placed in this encounter.   Signed, Park Liter, MD, [facility] 03/10/2017 11:45 AM    [place]

## 2017-03-11 DIAGNOSIS — M5136 Other intervertebral disc degeneration, lumbar region: Secondary | ICD-10-CM | POA: Diagnosis not present

## 2017-03-11 DIAGNOSIS — G4733 Obstructive sleep apnea (adult) (pediatric): Secondary | ICD-10-CM | POA: Diagnosis not present

## 2017-03-11 DIAGNOSIS — R2689 Other abnormalities of gait and mobility: Secondary | ICD-10-CM | POA: Diagnosis not present

## 2017-03-11 DIAGNOSIS — E119 Type 2 diabetes mellitus without complications: Secondary | ICD-10-CM | POA: Diagnosis not present

## 2017-03-11 DIAGNOSIS — G40211 Localization-related (focal) (partial) symptomatic epilepsy and epileptic syndromes with complex partial seizures, intractable, with status epilepticus: Secondary | ICD-10-CM | POA: Diagnosis not present

## 2017-03-11 DIAGNOSIS — J449 Chronic obstructive pulmonary disease, unspecified: Secondary | ICD-10-CM | POA: Diagnosis not present

## 2017-03-11 LAB — CBC WITH DIFFERENTIAL/PLATELET
Basophils Absolute: 0 10*3/uL (ref 0.0–0.2)
Basos: 0 %
EOS (ABSOLUTE): 0.1 10*3/uL (ref 0.0–0.4)
Eos: 1 %
Hematocrit: 43.4 % (ref 34.0–46.6)
Hemoglobin: 14.7 g/dL (ref 11.1–15.9)
Immature Grans (Abs): 0 10*3/uL (ref 0.0–0.1)
Immature Granulocytes: 0 %
Lymphocytes Absolute: 2.8 10*3/uL (ref 0.7–3.1)
Lymphs: 24 %
MCH: 30.8 pg (ref 26.6–33.0)
MCHC: 33.9 g/dL (ref 31.5–35.7)
MCV: 91 fL (ref 79–97)
Monocytes Absolute: 0.8 10*3/uL (ref 0.1–0.9)
Monocytes: 7 %
Neutrophils Absolute: 8 10*3/uL — ABNORMAL HIGH (ref 1.4–7.0)
Neutrophils: 68 %
Platelets: 284 10*3/uL (ref 150–379)
RBC: 4.77 x10E6/uL (ref 3.77–5.28)
RDW: 17.5 % — ABNORMAL HIGH (ref 12.3–15.4)
WBC: 11.7 10*3/uL — ABNORMAL HIGH (ref 3.4–10.8)

## 2017-03-11 LAB — COMPREHENSIVE METABOLIC PANEL
ALT: 9 IU/L (ref 0–32)
AST: 12 IU/L (ref 0–40)
Albumin/Globulin Ratio: 1.5 (ref 1.2–2.2)
Albumin: 4.5 g/dL (ref 3.5–5.5)
Alkaline Phosphatase: 129 IU/L — ABNORMAL HIGH (ref 39–117)
BUN/Creatinine Ratio: 15 (ref 9–23)
BUN: 16 mg/dL (ref 6–24)
Bilirubin Total: <0.2 mg/dL (ref 0.0–1.2)
CO2: 22 mmol/L (ref 20–29)
Calcium: 9.5 mg/dL (ref 8.7–10.2)
Chloride: 98 mmol/L (ref 96–106)
Creatinine, Ser: 1.08 mg/dL — ABNORMAL HIGH (ref 0.57–1.00)
GFR calc Af Amer: 67 mL/min/{1.73_m2} (ref >59)
GFR calc non Af Amer: 58 mL/min/{1.73_m2} — ABNORMAL LOW (ref >59)
Globulin, Total: 3.1 g/dL (ref 1.5–4.5)
Glucose: 80 mg/dL (ref 65–99)
Potassium: 4.9 mmol/L (ref 3.5–5.2)
Sodium: 138 mmol/L (ref 134–144)
Total Protein: 7.6 g/dL (ref 6.0–8.5)

## 2017-03-11 LAB — PROTIME-INR
INR: 0.9 (ref 0.8–1.2)
Prothrombin Time: 9.8 s (ref 9.1–12.0)

## 2017-03-14 ENCOUNTER — Telehealth: Payer: Self-pay | Admitting: Cardiology

## 2017-03-14 DIAGNOSIS — E119 Type 2 diabetes mellitus without complications: Secondary | ICD-10-CM | POA: Diagnosis not present

## 2017-03-14 DIAGNOSIS — G4733 Obstructive sleep apnea (adult) (pediatric): Secondary | ICD-10-CM | POA: Diagnosis not present

## 2017-03-14 DIAGNOSIS — G40211 Localization-related (focal) (partial) symptomatic epilepsy and epileptic syndromes with complex partial seizures, intractable, with status epilepticus: Secondary | ICD-10-CM | POA: Diagnosis not present

## 2017-03-14 DIAGNOSIS — J449 Chronic obstructive pulmonary disease, unspecified: Secondary | ICD-10-CM | POA: Diagnosis not present

## 2017-03-14 DIAGNOSIS — M5136 Other intervertebral disc degeneration, lumbar region: Secondary | ICD-10-CM | POA: Diagnosis not present

## 2017-03-14 DIAGNOSIS — R2689 Other abnormalities of gait and mobility: Secondary | ICD-10-CM | POA: Diagnosis not present

## 2017-03-14 MED ORDER — NITROGLYCERIN 0.4 MG SL SUBL: 0.4000 mg | SUBLINGUAL_TABLET | SUBLINGUAL | 5 refills | Status: DC | PRN

## 2017-03-14 NOTE — Telephone Encounter (Signed)
Refills for Nitro provided as requested

## 2017-03-14 NOTE — Telephone Encounter (Signed)
Please call Nitro the [employer] on [address].Marland Kitchen

## 2017-03-15 ENCOUNTER — Telehealth: Payer: Self-pay

## 2017-03-15 NOTE — Telephone Encounter (Signed)
Left detailed message per DPR.  Patient contacted pre-catheterization at [facility] scheduled for:  03/17/2015 @ 0830 Verified arrival time and place: NT @ 0630 Confirmed AM meds to be taken pre-cath with sip of water:  Informed to please take ASA prior to arrival.  Notified to hold Januvia morning of procedure. Confirmed patient has responsible person to drive home post procedure and observe patient for 24 hours:  Notified Addl concerns:  Morphine allergies

## 2017-03-16 ENCOUNTER — Ambulatory Visit (HOSPITAL_COMMUNITY)
Admission: RE | Admit: 2017-03-16 | Discharge: 2017-03-16 | Disposition: A | Discharge status: Home / Self Care | Payer: Medicare Other | Source: Ambulatory Visit | Attending: Cardiology | Admitting: Cardiology

## 2017-03-16 ENCOUNTER — Encounter (HOSPITAL_COMMUNITY)
Admission: RE | Disposition: A | Discharge status: Home / Self Care | Payer: Self-pay | Source: Ambulatory Visit | Attending: Cardiology

## 2017-03-16 DIAGNOSIS — I25119 Atherosclerotic heart disease of native coronary artery with unspecified angina pectoris: Secondary | ICD-10-CM

## 2017-03-16 DIAGNOSIS — Z7951 Long term (current) use of inhaled steroids: Secondary | ICD-10-CM | POA: Insufficient documentation

## 2017-03-16 DIAGNOSIS — F17210 Nicotine dependence, cigarettes, uncomplicated: Secondary | ICD-10-CM | POA: Diagnosis not present

## 2017-03-16 DIAGNOSIS — Z7982 Long term (current) use of aspirin: Secondary | ICD-10-CM | POA: Insufficient documentation

## 2017-03-16 DIAGNOSIS — Z885 Allergy status to narcotic agent status: Secondary | ICD-10-CM | POA: Diagnosis not present

## 2017-03-16 DIAGNOSIS — E119 Type 2 diabetes mellitus without complications: Secondary | ICD-10-CM | POA: Insufficient documentation

## 2017-03-16 DIAGNOSIS — I10 Essential (primary) hypertension: Secondary | ICD-10-CM | POA: Insufficient documentation

## 2017-03-16 DIAGNOSIS — I25118 Atherosclerotic heart disease of native coronary artery with other forms of angina pectoris: Secondary | ICD-10-CM | POA: Insufficient documentation

## 2017-03-16 DIAGNOSIS — R569 Unspecified convulsions: Secondary | ICD-10-CM | POA: Diagnosis not present

## 2017-03-16 DIAGNOSIS — E785 Hyperlipidemia, unspecified: Secondary | ICD-10-CM | POA: Insufficient documentation

## 2017-03-16 DIAGNOSIS — J449 Chronic obstructive pulmonary disease, unspecified: Secondary | ICD-10-CM | POA: Diagnosis not present

## 2017-03-16 DIAGNOSIS — I208 Other forms of angina pectoris: Secondary | ICD-10-CM | POA: Diagnosis present

## 2017-03-16 HISTORY — PX: LEFT HEART CATH AND CORONARY ANGIOGRAPHY: CATH118249

## 2017-03-16 LAB — GLUCOSE, CAPILLARY
Glucose-Capillary: 113 mg/dL — ABNORMAL HIGH (ref 65–99)
Glucose-Capillary: 114 mg/dL — ABNORMAL HIGH (ref 65–99)

## 2017-03-16 SURGERY — LEFT HEART CATH AND CORONARY ANGIOGRAPHY
Anesthesia: LOCAL

## 2017-03-16 MED ORDER — LIDOCAINE HCL (PF) 1 % IJ SOLN
INTRAMUSCULAR | Status: AC
  Filled 2017-03-16: qty 30

## 2017-03-16 MED ORDER — MIDAZOLAM HCL 2 MG/2ML IJ SOLN
INTRAMUSCULAR | Status: AC
  Filled 2017-03-16: qty 2

## 2017-03-16 MED ORDER — MIDAZOLAM HCL 2 MG/2ML IJ SOLN
INTRAMUSCULAR | Status: DC | PRN
  Administered 2017-03-16: 1 mg via INTRAVENOUS
  Administered 2017-03-16: 2 mg via INTRAVENOUS

## 2017-03-16 MED ORDER — HEPARIN SODIUM (PORCINE) 1000 UNIT/ML IJ SOLN
INTRAMUSCULAR | Status: AC
  Filled 2017-03-16: qty 1

## 2017-03-16 MED ORDER — IOPAMIDOL (ISOVUE-370) INJECTION 76%
INTRAVENOUS | Status: DC | PRN
  Administered 2017-03-16: 30 mL via INTRA_ARTERIAL

## 2017-03-16 MED ORDER — LIDOCAINE HCL (PF) 1 % IJ SOLN
INTRAMUSCULAR | Status: DC | PRN
  Administered 2017-03-16: 2 mL via INTRADERMAL

## 2017-03-16 MED ORDER — SODIUM CHLORIDE 0.9 % IV SOLN: INTRAVENOUS | Status: AC

## 2017-03-16 MED ORDER — VERAPAMIL HCL 2.5 MG/ML IV SOLN
INTRAVENOUS | Status: DC | PRN
  Administered 2017-03-16 (×2): 10 mL via INTRA_ARTERIAL

## 2017-03-16 MED ORDER — SODIUM CHLORIDE 0.9 % WEIGHT BASED INFUSION
3.0000 mL/kg/h | INTRAVENOUS | Status: AC
  Administered 2017-03-16: 3 mL/kg/h via INTRAVENOUS

## 2017-03-16 MED ORDER — SODIUM CHLORIDE 0.9 % IV SOLN: 250.0000 mL | INTRAVENOUS | Status: DC | PRN

## 2017-03-16 MED ORDER — IOPAMIDOL (ISOVUE-370) INJECTION 76%
INTRAVENOUS | Status: AC
  Filled 2017-03-16: qty 100

## 2017-03-16 MED ORDER — SODIUM CHLORIDE 0.9 % WEIGHT BASED INFUSION: 1.0000 mL/kg/h | INTRAVENOUS | Status: DC

## 2017-03-16 MED ORDER — HEPARIN SODIUM (PORCINE) 1000 UNIT/ML IJ SOLN
INTRAMUSCULAR | Status: DC | PRN
  Administered 2017-03-16: 4500 [IU] via INTRAVENOUS

## 2017-03-16 MED ORDER — ACETAMINOPHEN 325 MG PO TABS
ORAL_TABLET | ORAL | Status: AC
  Administered 2017-03-16: 650 mg via ORAL
  Filled 2017-03-16: qty 2

## 2017-03-16 MED ORDER — VERAPAMIL HCL 2.5 MG/ML IV SOLN
INTRAVENOUS | Status: AC
  Filled 2017-03-16: qty 2

## 2017-03-16 MED ORDER — SODIUM CHLORIDE 0.9% FLUSH: 3.0000 mL | INTRAVENOUS | Status: DC | PRN

## 2017-03-16 MED ORDER — HEPARIN (PORCINE) IN NACL 2-0.9 UNIT/ML-% IJ SOLN
INTRAMUSCULAR | Status: AC
  Filled 2017-03-16: qty 1000

## 2017-03-16 MED ORDER — SODIUM CHLORIDE 0.9% FLUSH: 3.0000 mL | Freq: Two times a day (BID) | INTRAVENOUS | Status: DC

## 2017-03-16 MED ORDER — ASPIRIN 81 MG PO CHEW: 81.0000 mg | CHEWABLE_TABLET | ORAL | Status: DC

## 2017-03-16 MED ORDER — HEPARIN (PORCINE) IN NACL 2-0.9 UNIT/ML-% IJ SOLN
INTRAMUSCULAR | Status: AC | PRN
  Administered 2017-03-16: 1000 mL

## 2017-03-16 MED ORDER — FENTANYL CITRATE (PF) 100 MCG/2ML IJ SOLN
INTRAMUSCULAR | Status: DC | PRN
  Administered 2017-03-16 (×2): 25 ug via INTRAVENOUS

## 2017-03-16 MED ORDER — ACETAMINOPHEN 325 MG PO TABS
650.0000 mg | ORAL_TABLET | Freq: Four times a day (QID) | ORAL | Status: DC | PRN
  Administered 2017-03-16: 650 mg via ORAL
  Filled 2017-03-16: qty 2

## 2017-03-16 MED ORDER — FENTANYL CITRATE (PF) 100 MCG/2ML IJ SOLN
INTRAMUSCULAR | Status: AC
  Filled 2017-03-16: qty 2

## 2017-03-16 SURGICAL SUPPLY — 9 items

## 2017-03-16 NOTE — H&P (View-Only) (Signed)
Cardiology Office Note:    Date:  03/10/2017   ID:  Kathy Moss, DOB 01-Feb-1961, MRN [id number]  PCP:  Raina Mina., MD  Cardiologist:  Jenne Campus, MD    Referring MD: Raina Mina., MD   Chief Complaint  Patient presents with  . Discuss Cath and Renexa  I need cardiac catheterization  History of Present Illness:    Kathy Moss is a 56 y.o. female  with multiple medical problems. I be taking care of her for years. Used to been dealing with some atypical chest pain. Recently she did see Dr. Cristobal Goldmann in [place] office and he told her that she needs cardiac catheterization. She did not want to proceed with cardiac catheterization because she wanted my opinion regarding the patient. And frankly I think reached the point that the only way to answer the question if she has significant coronary artery disease would be to proceed with cardiac catheterization. Years ago she had a stress test which shows some minor abnormality only, she drinks producing very atypical chest pain for years with some partial relief with antianginal medications. She describes now worsening of chest pain remaining or frequent. However those pains are not related to exercise. Patient not relieved by rest. Again she takes some antianginal medication with partial relief of the pain. Cardiac catheterization was explained including all risks benefits as well as alternatives. She agreed to proceed.   Past Medical History:  Diagnosis Date  . Angina at rest [facility]) 2013  . Asthma   . Hypertension   . Recurrent upper respiratory infection (URI)   . Seizures ([place])   . Type 2 diabetes mellitus without complication, without long-term current use of insulin ([place]) 06/30/2016    Past Surgical History:  Procedure Laterality Date  . APPENDECTOMY    . BACK SURGERY    . CHOLECYSTECTOMY    . HERNIA REPAIR      Current Medications: Current Meds  Medication Sig  . albuterol (PROVENTIL HFA;VENTOLIN HFA) 108 (90 Base)  MCG/ACT inhaler Inhale 1-2 puffs into the lungs every 6 (six) hours as needed for wheezing or shortness of breath.  . ALPRAZolam (XANAX) 0.5 MG tablet Take 0.5 mg by mouth 3 (three) times daily as needed for anxiety.  Marland Kitchen aspirin EC 81 MG tablet Take 81 mg by mouth daily.  Marland Kitchen azelastine (ASTELIN) 0.1 % nasal spray Place 1 spray into both nostrils 2 (two) times daily. Use in each nostril as directed  . beclomethasone (QVAR) 80 MCG/ACT inhaler Inhale 1 puff into the lungs 2 (two) times daily.  . butalbital-acetaminophen-caffeine (FIORICET, ESGIC) 50-325-40 MG tablet TAKE 1 TABLET BY MOUTH EVERY 6 HOURS AS NEEDED FOR PAIN.  Marland Kitchen diltiazem (CARDIZEM LA) 120 MG 24 hr tablet Take 120 mg by mouth daily.   Marland Kitchen esomeprazole (NEXIUM) 40 MG capsule Take 40 mg by mouth 3 (three) times daily.   . Eszopiclone (ESZOPICLONE) 3 MG TABS Take 3 mg by mouth at bedtime. Take immediately before bedtime  . fluticasone (FLONASE) 50 MCG/ACT nasal spray Place 1 spray into both nostrils daily.  . fluticasone furoate-vilanterol (BREO ELLIPTA) 200-25 MCG/INH AEPB Inhale 1 puff into the lungs 2 (two) times daily.  Marland Kitchen gabapentin (NEURONTIN) 300 MG capsule Take 300 mg by mouth 3 (three) times daily.  Marland Kitchen lacosamide (VIMPAT) 200 MG TABS tablet Take 200 mg by mouth 2 (two) times daily.  Marland Kitchen lamoTRIgine (LAMICTAL) 25 MG tablet Take 25 mg by mouth 2 (two) times daily.  Marland Kitchen levETIRAcetam (KEPPRA)  500 MG tablet Take 500 mg by mouth 2 (two) times daily.  . nitroGLYCERIN (NITROSTAT) 0.4 MG SL tablet Place 0.4 mg under the tongue every 5 (five) minutes as needed for chest pain.  Marland Kitchen nystatin (MYCOSTATIN) 100000 UNIT/ML suspension Take 5 mLs by mouth 3 (three) times daily.  Marland Kitchen olmesartan (BENICAR) 20 MG tablet Take 20 mg by mouth daily.  . Omega-3 Fatty Acids (FISH OIL PO) Take 1,000 mg by mouth 4 (four) times daily.  Marland Kitchen oxyCODONE-acetaminophen (PERCOCET) 10-325 MG tablet Take 1 tablet by mouth every 4 (four) hours.  . promethazine (PHENERGAN) 25 MG tablet  Take 25 mg by mouth every 6 (six) hours as needed for nausea or vomiting.  . ranitidine (ZANTAC) 300 MG tablet Take 300 mg by mouth at bedtime.  . ranolazine (RANEXA) 500 MG 12 hr tablet Take 1 tablet (500 mg total) by mouth 2 (two) times daily.  Marland Kitchen rOPINIRole (REQUIP) 1 MG tablet Take 3 mg by mouth at bedtime.   . rosuvastatin (CRESTOR) 5 MG tablet Take 5 mg by mouth daily at 6 PM.  . sitaGLIPtin (JANUVIA) 25 MG tablet Take 25 mg by mouth 2 (two) times daily.  Marland Kitchen tiotropium (SPIRIVA) 18 MCG inhalation capsule Place 18 mcg into inhaler and inhale daily.  Marland Kitchen tiZANidine (ZANAFLEX) 4 MG capsule Take 4 mg by mouth 3 (three) times daily.  . Vitamin D, Ergocalciferol, (DRISDOL) 50000 units CAPS capsule Take 50,000 Units by mouth every Tuesday.      Allergies:   Carimune [immune globulin]; Imitrex [sumatriptan]; Morphine; Tramadol; Adhesive [tape]; Other; Amitriptyline; Buprenorphine hcl; Morphine and related; Sumatriptan succinate; and Tramadol-acetaminophen   Social History   Social History  . Marital status: Single    Spouse name: N/A  . Number of children: N/A  . Years of education: N/A   Social History Main Topics  . Smoking status: Current Every Day Smoker    Packs/day: 0.50    Years: 36.00    Types: Cigarettes  . Smokeless tobacco: Never Used  . Alcohol use No  . Drug use: No  . Sexual activity: Not Asked   Other Topics Concern  . None   Social History Narrative  . None     Family History: The patient's family history includes Allergic rhinitis in her brother; Asthma in her mother; COPD in her brother; High blood pressure in her mother; Lung cancer in her mother; Seizures in her mother; Stroke in her mother. ROS:   Please see the history of present illness.    All 14 point review of systems negative except as described per history of present illness  EKGs/Labs/Other Studies Reviewed:      Recent Labs: 07/20/2016: ALT 8; BUN 12; Creatinine, Ser 0.96; Hemoglobin 13.9;  Platelets 287; Potassium 4.4; Sodium 137  Recent Lipid Panel No results found for: CHOL, TRIG, HDL, CHOLHDL, VLDL, LDLCALC, LDLDIRECT  Physical Exam:    VS:  BP 140/78   Pulse 76   Resp 12   Ht 5\' 5"  (1.651 m)   Wt 197 lb 6.4 oz (89.5 kg)   BMI 32.85 kg/m     Wt Readings from Last 3 Encounters:  03/10/17 197 lb 6.4 oz (89.5 kg)  09/01/16 203 lb (92.1 kg)  07/20/16 208 lb (94.3 kg)     GEN:  Well nourished, well developed in no acute distress HEENT: Normal NECK: No JVD; No carotid bruits LYMPHATICS: No lymphadenopathy CARDIAC: RRR, no murmurs, no rubs, no gallops RESPIRATORY:  Clear to auscultation without rales,  wheezing or rhonchi  ABDOMEN: Soft, non-tender, non-distended MUSCULOSKELETAL:  No edema; No deformity  SKIN: Warm and dry LOWER EXTREMITIES: no swelling NEUROLOGIC:  Alert and oriented x 3 PSYCHIATRIC:  Normal affect   ASSESSMENT:    No diagnosis found. PLAN:    In order of problems listed above:  1. Atypical angina pectoris: Long discussion with her regarding cardiac catheterization. I really think that reached the point that the only way to clarify if she does have significant coronary artery disease he is to perform cardiac catheterization. Procedure was explained to her including all risks and benefits as well as alternatives will proceed. C tests will be done make sure we assess risk before procedure. 2. Smoking: COPD. She understands that this is a problem she used to smoke 2 packs per day she cut it down to half pack per day. Congratulated her for it and I strongly advised to completely stop. 3. Dyslipidemia: She is on Crestor which I will continue. She takes only small dose of this medication. She did have difficulty tolerating higher dosages. Will talk about needs of intensification of statin therapy after results of cardiac catheterization will be available. 4. Diabetes mellitus: Follow-up by primary care physician.  Medication Adjustments/Labs and  Tests Ordered: Current medicines are reviewed at length with the patient today.  Concerns regarding medicines are outlined above.  No orders of the defined types were placed in this encounter.  Medication changes: No orders of the defined types were placed in this encounter.   Signed, Park Liter, MD, [facility] 03/10/2017 11:45 AM    [place]

## 2017-03-16 NOTE — Interval H&P Note (Signed)
Cath Lab Visit (complete for each Cath Lab visit)  Clinical Evaluation Leading to the Procedure:   ACS: No.  Non-ACS:    Anginal Classification: CCS III   Anti-ischemic medical therapy: Minimal Therapy (1 class of medications)  Non-Invasive Test Results: No non-invasive testing performed  Prior CABG: No previous CABG      History and Physical Interval Note:  03/16/2017 8:27 AM  Kathy Moss  has presented today for surgery, with the diagnosis of cp  The various methods of treatment have been discussed with the patient and family. After consideration of risks, benefits and other options for treatment, the patient has consented to  Procedure(s): Left Heart Cath and Coronary Angiography (N/A) as a surgical intervention .  The patient's history has been reviewed, patient examined, no change in status, stable for surgery.  I have reviewed the patient's chart and labs.  Questions were answered to the patient's satisfaction.     [name]

## 2017-03-16 NOTE — Discharge Instructions (Signed)

## 2017-03-17 ENCOUNTER — Encounter (HOSPITAL_COMMUNITY): Payer: Self-pay | Admitting: Interventional Cardiology

## 2017-03-18 DIAGNOSIS — R2689 Other abnormalities of gait and mobility: Secondary | ICD-10-CM | POA: Diagnosis not present

## 2017-03-18 DIAGNOSIS — G4733 Obstructive sleep apnea (adult) (pediatric): Secondary | ICD-10-CM | POA: Diagnosis not present

## 2017-03-18 DIAGNOSIS — M5136 Other intervertebral disc degeneration, lumbar region: Secondary | ICD-10-CM | POA: Diagnosis not present

## 2017-03-18 DIAGNOSIS — J449 Chronic obstructive pulmonary disease, unspecified: Secondary | ICD-10-CM | POA: Diagnosis not present

## 2017-03-18 DIAGNOSIS — G40211 Localization-related (focal) (partial) symptomatic epilepsy and epileptic syndromes with complex partial seizures, intractable, with status epilepticus: Secondary | ICD-10-CM | POA: Diagnosis not present

## 2017-03-18 DIAGNOSIS — E119 Type 2 diabetes mellitus without complications: Secondary | ICD-10-CM | POA: Diagnosis not present

## 2017-03-20 ENCOUNTER — Emergency Department (HOSPITAL_COMMUNITY): Payer: Medicare Other

## 2017-03-20 ENCOUNTER — Encounter (HOSPITAL_COMMUNITY): Payer: Self-pay | Admitting: Emergency Medicine

## 2017-03-20 ENCOUNTER — Emergency Department (HOSPITAL_COMMUNITY)
Admission: EM | Admit: 2017-03-20 | Discharge: 2017-03-20 | Disposition: A | Discharge status: Home / Self Care | Payer: Medicare Other | Attending: Emergency Medicine | Admitting: Emergency Medicine

## 2017-03-20 DIAGNOSIS — M79641 Pain in right hand: Secondary | ICD-10-CM | POA: Diagnosis not present

## 2017-03-20 DIAGNOSIS — F17210 Nicotine dependence, cigarettes, uncomplicated: Secondary | ICD-10-CM | POA: Diagnosis not present

## 2017-03-20 DIAGNOSIS — J45909 Unspecified asthma, uncomplicated: Secondary | ICD-10-CM | POA: Diagnosis not present

## 2017-03-20 DIAGNOSIS — J449 Chronic obstructive pulmonary disease, unspecified: Secondary | ICD-10-CM | POA: Diagnosis not present

## 2017-03-20 DIAGNOSIS — E119 Type 2 diabetes mellitus without complications: Secondary | ICD-10-CM | POA: Insufficient documentation

## 2017-03-20 DIAGNOSIS — Z794 Long term (current) use of insulin: Secondary | ICD-10-CM | POA: Insufficient documentation

## 2017-03-20 DIAGNOSIS — Z79899 Other long term (current) drug therapy: Secondary | ICD-10-CM | POA: Insufficient documentation

## 2017-03-20 DIAGNOSIS — M79621 Pain in right upper arm: Secondary | ICD-10-CM | POA: Diagnosis not present

## 2017-03-20 DIAGNOSIS — I10 Essential (primary) hypertension: Secondary | ICD-10-CM | POA: Insufficient documentation

## 2017-03-20 DIAGNOSIS — G8918 Other acute postprocedural pain: Secondary | ICD-10-CM | POA: Diagnosis not present

## 2017-03-20 DIAGNOSIS — M25531 Pain in right wrist: Secondary | ICD-10-CM | POA: Diagnosis not present

## 2017-03-20 LAB — CBC WITH DIFFERENTIAL/PLATELET
Basophils Absolute: 0 10*3/uL (ref 0.0–0.1)
Basophils Relative: 0 %
Eosinophils Absolute: 0.2 10*3/uL (ref 0.0–0.7)
Eosinophils Relative: 2 %
HCT: 42.1 % (ref 36.0–46.0)
Hemoglobin: 14.7 g/dL (ref 12.0–15.0)
Lymphocytes Relative: 22 %
Lymphs Abs: 2.4 10*3/uL (ref 0.7–4.0)
MCH: 31.4 pg (ref 26.0–34.0)
MCHC: 34.9 g/dL (ref 30.0–36.0)
MCV: 90 fL (ref 78.0–100.0)
Monocytes Absolute: 0.8 10*3/uL (ref 0.1–1.0)
Monocytes Relative: 8 %
Neutro Abs: 7.5 10*3/uL (ref 1.7–7.7)
Neutrophils Relative %: 68 %
Platelets: 228 10*3/uL (ref 150–400)
RBC: 4.68 MIL/uL (ref 3.87–5.11)
RDW: 16.4 % — ABNORMAL HIGH (ref 11.5–15.5)
WBC: 10.9 10*3/uL — ABNORMAL HIGH (ref 4.0–10.5)

## 2017-03-20 LAB — BASIC METABOLIC PANEL
Anion gap: 10 (ref 5–15)
BUN: 13 mg/dL (ref 6–20)
CO2: 22 mmol/L (ref 22–32)
Calcium: 9.2 mg/dL (ref 8.9–10.3)
Chloride: 104 mmol/L (ref 101–111)
Creatinine, Ser: 1.15 mg/dL — ABNORMAL HIGH (ref 0.44–1.00)
GFR calc Af Amer: >60 mL/min (ref >60)
GFR calc non Af Amer: 53 mL/min — ABNORMAL LOW (ref >60)
Glucose, Bld: 120 mg/dL — ABNORMAL HIGH (ref 65–99)
Potassium: 4.3 mmol/L (ref 3.5–5.1)
Sodium: 136 mmol/L (ref 135–145)

## 2017-03-20 MED ORDER — OXYCODONE-ACETAMINOPHEN 5-325 MG PO TABS
2.0000 | ORAL_TABLET | Freq: Once | ORAL | Status: AC
  Administered 2017-03-20: 2 via ORAL
  Filled 2017-03-20: qty 2

## 2017-03-20 MED ORDER — HYDROMORPHONE HCL 1 MG/ML IJ SOLN: 0.5000 mg | Freq: Once | INTRAMUSCULAR | Status: DC

## 2017-03-20 MED ORDER — HYDROMORPHONE HCL 1 MG/ML IJ SOLN
1.0000 mg | Freq: Once | INTRAMUSCULAR | Status: AC
  Administered 2017-03-20: 1 mg via INTRAVENOUS
  Filled 2017-03-20: qty 1

## 2017-03-20 MED ORDER — OXYCODONE-ACETAMINOPHEN 10-325 MG PO TABS: 1.0000 | ORAL_TABLET | Freq: Four times a day (QID) | ORAL | 0 refills | Status: DC | PRN

## 2017-03-20 MED ORDER — IOPAMIDOL (ISOVUE-370) INJECTION 76%
INTRAVENOUS | Status: AC
  Administered 2017-03-20: 100 mL
  Filled 2017-03-20: qty 100

## 2017-03-20 MED ORDER — ONDANSETRON HCL 4 MG/2ML IJ SOLN
4.0000 mg | Freq: Once | INTRAMUSCULAR | Status: AC
  Administered 2017-03-20: 4 mg via INTRAVENOUS
  Filled 2017-03-20: qty 2

## 2017-03-20 NOTE — ED Notes (Signed)
Soda given to family member.

## 2017-03-20 NOTE — ED Provider Notes (Signed)
9:05 PM Patient care assumed from [facility], PA-C at shift change pending CT angiogram. Patient with history of recent cardiac catheterization 4 days ago. She presents for pain to the volar aspect of her right wrist around the site of the entry point for her cath. Compartments of her right forearm are soft. Distal radial pulse intact. Capillary refill brisk in all digits. CT angiogram shows non-opacification of the radial artery beginning 6 cm distal to the antecubital fossa. Differential includes arterial thrombus/occlusion versus flow-obstructive spasm.   Case discussed with Dr. Bridgett Larsson of [facility] who states that occlusion is unlikely and spasm is favored given presence of warm distal extremity and palpable pulse. He also mentions that consult to cardiology should precede vascular consultation as complaints are directly secondary to a cardiology procedure. Will consult [facility] to make aware of patient case, should they be able to offer any additional recommendations.  9:30 PM Case discussed with Dr. Harrell Gave on-call for cardiology. She is also reassured by the fact that the patient has good distal pulses and soft compartments. She agrees that spasm is favored, though she recommends close outpatient follow-up to ensure no symptom worsening. Dr. Harrell Gave to attempt to contact cardiology office so that patient can follow-up in the clinic soon, preferably tomorrow. Plan is for discharge with adequate pain control.  10:00 PM Patient's medications reviewed. She is prescribed chronic 10/325 Percocet, last filled on 02/23/2017 or a 30 day supply. I have explained to patient that we are unable to prescribe any additional narcotics for her, but do advise the use of ibuprofen or naproxen for additional pain management. Patient verbalizes understanding.   Results for orders placed or performed during the hospital encounter of 73/42/87  Basic metabolic panel  Result Value Ref Range   Sodium 136 135 - 145 mmol/L   Potassium 4.3 3.5 - 5.1 mmol/L   Chloride 104 101 - 111 mmol/L   CO2 22 22 - 32 mmol/L   Glucose, Bld 120 (H) 65 - 99 mg/dL   BUN 13 6 - 20 mg/dL   Creatinine, Ser 1.15 (H) 0.44 - 1.00 mg/dL   Calcium 9.2 8.9 - 10.3 mg/dL   GFR calc non Af Amer 53 (L) >60 mL/min   GFR calc Af Amer >60 >60 mL/min   Anion gap 10 5 - 15  CBC with Differential  Result Value Ref Range   WBC 10.9 (H) 4.0 - 10.5 K/uL   RBC 4.68 3.87 - 5.11 MIL/uL   Hemoglobin 14.7 12.0 - 15.0 g/dL   HCT 42.1 36.0 - 46.0 %   MCV 90.0 78.0 - 100.0 fL   MCH 31.4 26.0 - 34.0 pg   MCHC 34.9 30.0 - 36.0 g/dL   RDW 16.4 (H) 11.5 - 15.5 %   Platelets 228 150 - 400 K/uL   Neutrophils Relative % 68 %   Neutro Abs 7.5 1.7 - 7.7 K/uL   Lymphocytes Relative 22 %   Lymphs Abs 2.4 0.7 - 4.0 K/uL   Monocytes Relative 8 %   Monocytes Absolute 0.8 0.1 - 1.0 K/uL   Eosinophils Relative 2 %   Eosinophils Absolute 0.2 0.0 - 0.7 K/uL   Basophils Relative 0 %   Basophils Absolute 0.0 0.0 - 0.1 K/uL   Ct Angio Up Extrem Right W &/or Wo Contrast  Result Date: 03/20/2017 CLINICAL DATA:  Status post cardiac catheterization with axis via the right arm. Pain in the "Lower" right all arm since the procedure. EXAM: CT ANGIOGRAPHY UPPER RIGHT EXTREMITY<Procedure  Description>CT ANGIOGRAPHY UPPER RIGHT EXTREMITY TECHNIQUE: CT scanning was performed of the right lower extremity from a position just proximal to the elbow through the hand. CONTRAST:  100 cc Isovue 370 COMPARISON:  None. FINDINGS: The brachial artery is opacified. Bifurcation of the brachial artery into the ulnar and radial arteries is patent. Ulnar artery is patent to the level of the wrist. Radial artery is opacified about 6-7 cm distal to the antecubital fossa but does not opacify beyond that point. Given the image noise from scan technique (arm is at the patient's side) fine detail is obscured. Review of the MIP images confirms the above findings. IMPRESSION: Non opacification of the  radial artery beginning about 6 cm distal to the antecubital fossa. This appearance could potentially be related to significant flow-obstructive spasm, but arterial thrombus/occlusion must be considered. Electronically Signed   By: Misty Stanley M.D.   On: 03/20/2017 20:28      Antonietta Breach, PA-C 03/20/17 2230    Alfonzo Beers, MD 03/20/17 2238

## 2017-03-20 NOTE — ED Triage Notes (Signed)
Pt had a heart cath on wed. Pt's right wrist swollen and bruised. Pt complaining of extreme pain in wrist, numbness in fingers. Pt has sensation present. Hand is warm, able to move fingers. Cap refill <3 sec.

## 2017-03-20 NOTE — Discharge Instructions (Signed)
You take Percocet as needed for pain. Follow-up with your cardiology office in the next 24-48 hours for recheck of your right arm. Return to the emergency department for new or worsening symptoms such as onset of fever or if your hand becomes tingling, numb, pale in appearance, or if your fingers turn blue. You may also return for other new or concerning symptoms.

## 2017-03-20 NOTE — ED Notes (Signed)
Patient using bedside commode at this time.  

## 2017-03-20 NOTE — ED Provider Notes (Signed)
[place] DEPT Provider Note   CSN: [id number] Arrival date & time: 03/20/17  1819     History   Chief Complaint Chief Complaint  Patient presents with  . Post-op Problem    HPI Kathy Moss is a 56 y.o. female.  HPI   Pt p/w right wrist pain that began two days ago, two days after she had a heart catheterization.  The pain is located in the area of the bruising, has been gradually increasing. The pain is exacerbated by palpation and flexion of the thumb.  Has taken home pain medication from a remote surgery without improvement.   No fevers, CP, SOB, weakness or numbness of the hand.    Past Medical History:  Diagnosis Date  . Angina at rest [facility]) 2013  . Asthma   . Hypertension   . Recurrent upper respiratory infection (URI)   . Seizures ([place])   . Type 2 diabetes mellitus without complication, without long-term current use of insulin ([place]) 06/30/2016    Patient Active Problem List   Diagnosis Date Noted  . Chest pain 03/10/2017  . COPD with asthma ([place]) 07/20/2016  . Type 2 diabetes mellitus without complication, without long-term current use of insulin ([place]) 06/30/2016  . B12 deficiency 05/18/2016  . Centrilobular emphysema ([place]) 05/18/2016  . Degenerative disc disease, lumbar 05/18/2016  . Gastric polyposis 05/18/2016  . Primary insomnia 05/18/2016  . COPD (chronic obstructive pulmonary disease) ([place]) 03/05/2016  . Intractable epilepsy with complex partial seizures ([place]) 03/05/2016  . Lumbosacral spondylosis with myelopathy 03/28/2015  . Spinal stenosis 03/28/2015  . Obstructive sleep apnea syndrome 03/06/2015  . Pure hypercholesterolemia 03/06/2015  . Smoking 03/06/2015    Past Surgical History:  Procedure Laterality Date  . APPENDECTOMY    . BACK SURGERY    . CHOLECYSTECTOMY    . HERNIA REPAIR    . LEFT HEART CATH AND CORONARY ANGIOGRAPHY N/A 03/16/2017   Procedure: Left Heart Cath and Coronary Angiography;  Surgeon: Jettie Booze, MD;   Location: [place] CV LAB;  Service: Cardiovascular;  Laterality: N/A;    OB History    No data available       Home Medications    Prior to Admission medications   Medication Sig Start Date End Date Taking? Authorizing Provider  albuterol (PROVENTIL HFA;VENTOLIN HFA) 108 (90 Base) MCG/ACT inhaler Inhale 1-2 puffs into the lungs every 6 (six) hours as needed for wheezing or shortness of breath.    [provider]  ALPRAZolam Duanne Moron) 0.5 MG tablet Take 0.5 mg by mouth every 8 (eight) hours as needed for anxiety.     [provider]  aspirin EC 81 MG tablet Take 81 mg by mouth daily.    [provider]  azelastine (ASTELIN) 0.1 % nasal spray Place 2 sprays into both nostrils 2 (two) times daily. Use in each nostril as directed     [provider]  beclomethasone (QVAR) 80 MCG/ACT inhaler Inhale 1 puff into the lungs 2 (two) times daily.    [provider]  butalbital-acetaminophen-caffeine (FIORICET, ESGIC) 50-325-40 MG tablet TAKE 1-2 TABLET(S) BY MOUTH EVERY 4 HOURS AS NEEDED FOR PAIN. 03/08/16   [provider]  diltiazem (CARDIZEM LA) 120 MG 24 hr tablet Take 120 mg by mouth daily.     [provider]  esomeprazole (NEXIUM) 40 MG capsule Take 40 mg by mouth 2 (two) times daily before a meal.     [provider]  Eszopiclone (ESZOPICLONE) 3 MG  TABS Take 3 mg by mouth at bedtime. Take immediately before bedtime    [provider]  fluticasone (FLONASE) 50 MCG/ACT nasal spray Place 1 spray into both nostrils daily.    [provider]  fluticasone furoate-vilanterol (BREO ELLIPTA) 200-25 MCG/INH AEPB Inhale 1 puff into the lungs daily.     [provider]  gabapentin (NEURONTIN) 300 MG capsule Take 600 mg by mouth 3 (three) times daily.     [provider]  insulin glargine (LANTUS) 100 UNIT/ML injection Inject into the skin at bedtime as needed (high blood sugar).    [provider]  insulin lispro (HUMALOG) 100 UNIT/ML injection Inject 10 Units into the skin 3 (three) times daily as needed for high blood sugar (for high blood sugar greather than 150).    [provider]  lacosamide (VIMPAT) 200 MG TABS tablet Take 200 mg by mouth 2 (two) times daily.    [provider]  lamoTRIgine (LAMICTAL) 25 MG tablet Take 50 mg by mouth 2 (two) times daily.     [provider]  levETIRAcetam (KEPPRA) 500 MG tablet Take 500 mg by mouth 2 (two) times daily.    [provider]  nitroGLYCERIN (NITROSTAT) 0.4 MG SL tablet Place 1 tablet (0.4 mg total) under the tongue every 5 (five) minutes as needed for chest pain. 03/14/17   Park Liter, MD  nystatin (MYCOSTATIN) 100000 UNIT/ML suspension Take 5 mLs by mouth 3 (three) times daily as needed (FOR THRUSH).     [provider]  olmesartan (BENICAR) 20 MG tablet Take 20 mg by mouth daily.    [provider]  Omega-3 Fatty Acids (FISH OIL PO) Take 2,000 mg by mouth 2 (two) times daily.     [provider]  oxyCODONE-acetaminophen (PERCOCET) 10-325 MG tablet Take 1 tablet by mouth every 4 (four) hours. 12/02/16   [provider]  promethazine (PHENERGAN) 25 MG tablet Take 25 mg by mouth every 6 (six) hours as needed for nausea or vomiting.    [provider]  ranitidine (ZANTAC) 300 MG tablet Take 300 mg by mouth at bedtime.    [provider]  ranolazine (RANEXA) 500 MG 12 hr tablet Take 1 tablet (500 mg total) by mouth 2 (two) times daily. 03/04/17   Park Liter, MD  rOPINIRole (REQUIP) 1 MG tablet Take 3 mg by mouth at bedtime.     [provider]  rosuvastatin (CRESTOR) 5 MG tablet Take 5 mg by mouth daily at 6 PM.    [provider]  tiotropium (SPIRIVA) 18 MCG inhalation capsule Place 18 mcg into inhaler and inhale daily.    [provider]  tiZANidine (ZANAFLEX) 4 MG capsule Take 4 mg by mouth 3  (three) times daily.    [provider]  Vitamin D, Ergocalciferol, (DRISDOL) 50000 units CAPS capsule Take 50,000 Units by mouth every Tuesday.     [provider]    Family History Family History  Problem Relation Age of Onset  . High blood pressure Mother   . Lung cancer Mother   . Seizures Mother   . Stroke Mother   . Asthma Mother   . Allergic rhinitis Brother   . COPD Brother     Social History Social History  Substance Use Topics  . Smoking status: Current Every Day Smoker    Packs/day: 0.50    Years: 36.00    Types: Cigarettes  . Smokeless tobacco: Never Used  .  Alcohol use No     Allergies   Carimune [immune globulin]; Imitrex [sumatriptan]; Morphine; Tramadol; Adhesive [tape]; Amitriptyline; Buprenorphine hcl; and Morphine and related   Review of Systems Review of Systems  Constitutional: Negative for chills and fever.  Respiratory: Negative for shortness of breath.   Cardiovascular: Negative for chest pain.  Skin: Positive for color change.  Neurological: Negative for weakness.  Psychiatric/Behavioral: Negative for self-injury.     Physical Exam Updated Vital Signs BP (!) 141/81   Pulse 66   Temp 98.7 F (37.1 C) (Oral)   Resp 17   Ht 5\' 5"  (1.651 m)   Wt 89.4 kg (197 lb)   SpO2 95%   BMI 32.78 kg/m   Physical Exam  Constitutional: She appears well-developed and well-nourished. No distress.  HENT:  Head: Normocephalic and atraumatic.  Neck: Neck supple.  Cardiovascular: Normal rate and regular rhythm.   Pulmonary/Chest: Effort normal and breath sounds normal.  Musculoskeletal:  Right volar wrist with ecchymosis, tender to palpation.  Radial pulse is intact.  Hand is warm.  Full AROM of all fingers but with pain.  Distal sensation intact throughout.    Neurological: She is alert.  Skin: She is not diaphoretic.  Nursing note and vitals reviewed.    ED Treatments / Results  Labs (all labs ordered are listed, but only  abnormal results are displayed) Labs Reviewed  BASIC METABOLIC PANEL - Abnormal; Notable for the following:       Result Value   Glucose, Bld 120 (*)    Creatinine, Ser 1.15 (*)    GFR calc non Af Amer 53 (*)    All other components within normal limits  CBC WITH DIFFERENTIAL/PLATELET - Abnormal; Notable for the following:    WBC 10.9 (*)    RDW 16.4 (*)    All other components within normal limits    EKG  EKG Interpretation None       Radiology No results found.  Procedures Procedures (including critical care time)  Medications Ordered in ED Medications  HYDROmorphone (DILAUDID) injection 1 mg (1 mg Intravenous Given 03/20/17 1902)  iopamidol (ISOVUE-370) 76 % injection (100 mLs  Contrast Given 03/20/17 1932)  ondansetron (ZOFRAN) injection 4 mg (4 mg Intravenous Given 03/20/17 1958)     Initial Impression / Assessment and Plan / ED Course  I have reviewed the triage vital signs and the nursing notes.  Pertinent labs & imaging results that were available during my care of the patient were reviewed by me and considered in my medical decision making (see chart for details).     Afebrile nontoxic patient with pain in right volar wrist at access point for heart catheterization.  Discussed pt with Dr Canary Brim.  Labs demonstrate mild leukocytosis 10.9, hyperglycemia, creat 1.1 (stable).  CT angio UE pending at change of shift.  Signed out to [employer], PA-C, pending CT results.     Final Clinical Impressions(s) / ED Diagnoses   Final diagnoses:  Post-op pain    New Prescriptions New Prescriptions   No medications on file     Clayton Bibles, Hershal Coria 03/20/17 2017    Alfonzo Beers, MD 03/20/17 2020

## 2017-03-20 NOTE — ED Notes (Signed)
Pt SpO2 noted to be decr'd. States she's usually on 2Lpm at night. Placed pt on 2L via [place].

## 2017-03-20 NOTE — ED Notes (Signed)
Patient transported to CT 

## 2017-03-21 ENCOUNTER — Encounter: Payer: Self-pay | Admitting: Cardiology

## 2017-03-21 ENCOUNTER — Telehealth: Payer: Self-pay | Admitting: Cardiology

## 2017-03-21 ENCOUNTER — Ambulatory Visit (INDEPENDENT_AMBULATORY_CARE_PROVIDER_SITE_OTHER): Payer: Medicare Other | Admitting: Cardiology

## 2017-03-21 VITALS — BP 108/60 | HR 84 | Resp 10 | Ht 65.0 in | Wt 200.8 lb

## 2017-03-21 DIAGNOSIS — M7989 Other specified soft tissue disorders: Secondary | ICD-10-CM | POA: Diagnosis not present

## 2017-03-21 DIAGNOSIS — I8290 Acute embolism and thrombosis of unspecified vein: Secondary | ICD-10-CM | POA: Diagnosis not present

## 2017-03-21 DIAGNOSIS — F17200 Nicotine dependence, unspecified, uncomplicated: Secondary | ICD-10-CM | POA: Diagnosis not present

## 2017-03-21 DIAGNOSIS — I82629 Acute embolism and thrombosis of deep veins of unspecified upper extremity: Secondary | ICD-10-CM | POA: Diagnosis not present

## 2017-03-21 DIAGNOSIS — J449 Chronic obstructive pulmonary disease, unspecified: Secondary | ICD-10-CM | POA: Diagnosis not present

## 2017-03-21 DIAGNOSIS — E119 Type 2 diabetes mellitus without complications: Secondary | ICD-10-CM | POA: Diagnosis not present

## 2017-03-21 DIAGNOSIS — G4733 Obstructive sleep apnea (adult) (pediatric): Secondary | ICD-10-CM

## 2017-03-21 DIAGNOSIS — R072 Precordial pain: Secondary | ICD-10-CM | POA: Diagnosis not present

## 2017-03-21 DIAGNOSIS — M5136 Other intervertebral disc degeneration, lumbar region: Secondary | ICD-10-CM | POA: Diagnosis not present

## 2017-03-21 DIAGNOSIS — R2689 Other abnormalities of gait and mobility: Secondary | ICD-10-CM | POA: Diagnosis not present

## 2017-03-21 DIAGNOSIS — G40211 Localization-related (focal) (partial) symptomatic epilepsy and epileptic syndromes with complex partial seizures, intractable, with status epilepticus: Secondary | ICD-10-CM | POA: Diagnosis not present

## 2017-03-21 MED ORDER — OXYCODONE-ACETAMINOPHEN 2.5-325 MG PO TABS: 1.0000 | ORAL_TABLET | ORAL | 0 refills | Status: DC | PRN

## 2017-03-21 NOTE — Progress Notes (Signed)
Cardiology Office Note:    Date:  03/21/2017   ID:  Kathy Moss, DOB [birth year], MRN [id number]  PCP:  Raina Mina., MD  Cardiologist:  Jenne Campus, MD    Referring MD: Raina Mina., MD   Chief Complaint  Patient presents with  . Follow up Cath    Having complications  Having a lot of pain in the right forearm  History of Present Illness:    Kathy Moss is a 56 y.o. female  high cardiac catheterization last week. Catheterization was done via right radial approach. She was fine to have significant nonobstructive lesions. However almost immediately after cardiac catheterization started having probable with the forearm. In the matter-of-fact yesterday she had up going to the emergency room. CT angiogram of her right forearm was done which showed occlusion of the radial artery spasm or thrombus related. She called today and requested to be seen urgently because of the pain. She does have very good ulnar pulse in that arm, appendectomy we will refill is very brisk within 2-3 seconds. Hand is warm but not excessively hot, she is very tender in the area of arterial axis and just above it. Motion and sensation is intact.   Past Medical History:  Diagnosis Date  . Angina at rest [facility]) 2013  . Asthma   . Hypertension   . Recurrent upper respiratory infection (URI)   . Seizures ([place])   . Type 2 diabetes mellitus without complication, without long-term current use of insulin ([place]) 06/30/2016    Past Surgical History:  Procedure Laterality Date  . APPENDECTOMY    . BACK SURGERY    . CHOLECYSTECTOMY    . HERNIA REPAIR    . LEFT HEART CATH AND CORONARY ANGIOGRAPHY N/A 03/16/2017   Procedure: Left Heart Cath and Coronary Angiography;  Surgeon: Jettie Booze, MD;  Location: [facility];  Service: Cardiovascular;  Laterality: N/A;    Current Medications: Current Meds  Medication Sig  . albuterol (PROVENTIL HFA;VENTOLIN HFA) 108 (90 Base) MCG/ACT inhaler Inhale  1-2 puffs into the lungs every 6 (six) hours as needed for wheezing or shortness of breath.  . ALPRAZolam (XANAX) 0.5 MG tablet Take 0.5 mg by mouth every 8 (eight) hours as needed for anxiety.   Marland Kitchen aspirin EC 81 MG tablet Take 81 mg by mouth daily.  Marland Kitchen azelastine (ASTELIN) 0.1 % nasal spray Place 2 sprays into both nostrils 2 (two) times daily. Use in each nostril as directed   . beclomethasone (QVAR) 80 MCG/ACT inhaler Inhale 1 puff into the lungs 2 (two) times daily.  . butalbital-acetaminophen-caffeine (FIORICET, ESGIC) 50-325-40 MG tablet TAKE 1-2 TABLET(S) BY MOUTH EVERY 4 HOURS AS NEEDED FOR PAIN.  Marland Kitchen diltiazem (CARDIZEM LA) 120 MG 24 hr tablet Take 120 mg by mouth daily.   Marland Kitchen esomeprazole (NEXIUM) 40 MG capsule Take 40 mg by mouth 2 (two) times daily before a meal.   . Eszopiclone (ESZOPICLONE) 3 MG TABS Take 3 mg by mouth at bedtime. Take immediately before bedtime  . fluticasone (FLONASE) 50 MCG/ACT nasal spray Place 1 spray into both nostrils daily.  . fluticasone furoate-vilanterol (BREO ELLIPTA) 200-25 MCG/INH AEPB Inhale 1 puff into the lungs daily.   Marland Kitchen gabapentin (NEURONTIN) 300 MG capsule Take 600 mg by mouth 3 (three) times daily.   . insulin glargine (LANTUS) 100 UNIT/ML injection Inject into the skin at bedtime as needed (high blood sugar).  . insulin lispro (HUMALOG) 100 UNIT/ML injection Inject 10 Units  into the skin 3 (three) times daily as needed for high blood sugar (for high blood sugar greather than 150).  Marland Kitchen lacosamide (VIMPAT) 200 MG TABS tablet Take 200 mg by mouth 2 (two) times daily.  Marland Kitchen lamoTRIgine (LAMICTAL) 25 MG tablet Take 50 mg by mouth 2 (two) times daily.   Marland Kitchen levETIRAcetam (KEPPRA) 500 MG tablet Take 500 mg by mouth 2 (two) times daily.  . nitroGLYCERIN (NITROSTAT) 0.4 MG SL tablet Place 1 tablet (0.4 mg total) under the tongue every 5 (five) minutes as needed for chest pain.  Marland Kitchen nystatin (MYCOSTATIN) 100000 UNIT/ML suspension Take 5 mLs by mouth 3 (three) times  daily as needed (FOR THRUSH).   Marland Kitchen olmesartan (BENICAR) 20 MG tablet Take 20 mg by mouth daily.  . Omega-3 Fatty Acids (FISH OIL PO) Take 2,000 mg by mouth 2 (two) times daily.   . promethazine (PHENERGAN) 25 MG tablet Take 25 mg by mouth every 6 (six) hours as needed for nausea or vomiting.  . ranitidine (ZANTAC) 300 MG tablet Take 300 mg by mouth at bedtime.  . ranolazine (RANEXA) 500 MG 12 hr tablet Take 1 tablet (500 mg total) by mouth 2 (two) times daily.  Marland Kitchen rOPINIRole (REQUIP) 1 MG tablet Take 3 mg by mouth at bedtime.   . rosuvastatin (CRESTOR) 5 MG tablet Take 5 mg by mouth daily at 6 PM.  . tiotropium (SPIRIVA) 18 MCG inhalation capsule Place 18 mcg into inhaler and inhale daily.  Marland Kitchen tiZANidine (ZANAFLEX) 4 MG capsule Take 4 mg by mouth 3 (three) times daily.  . Vitamin D, Ergocalciferol, (DRISDOL) 50000 units CAPS capsule Take 50,000 Units by mouth every Tuesday.      Allergies:   Carimune [immune globulin]; Imitrex [sumatriptan]; Morphine; Tramadol; Adhesive [tape]; Amitriptyline; Buprenorphine hcl; and Morphine and related   Social History   Social History  . Marital status: Single    Spouse name: N/A  . Number of children: N/A  . Years of education: N/A   Social History Main Topics  . Smoking status: Current Every Day Smoker    Packs/day: 0.50    Years: 36.00    Types: Cigarettes  . Smokeless tobacco: Never Used  . Alcohol use No  . Drug use: No  . Sexual activity: Not Asked   Other Topics Concern  . None   Social History Narrative  . None     Family History: The patient's family history includes Allergic rhinitis in her brother; Asthma in her mother; COPD in her brother; High blood pressure in her mother; Lung cancer in her mother; Seizures in her mother; Stroke in her mother. ROS:   Please see the history of present illness.    All 14 point review of systems negative except as described per history of present illness  EKGs/Labs/Other Studies Reviewed:       Recent Labs: 03/10/2017: ALT 9 03/20/2017: BUN 13; Creatinine, Ser 1.15; Hemoglobin 14.7; Platelets 228; Potassium 4.3; Sodium 136  Recent Lipid Panel No results found for: CHOL, TRIG, HDL, CHOLHDL, VLDL, LDLCALC, LDLDIRECT  Physical Exam:    VS:  BP 108/60   Pulse 84   Resp 10   Ht 5\' 5"  (1.651 m)   Wt 200 lb 12.8 oz (91.1 kg)   BMI 33.41 kg/m     Wt Readings from Last 3 Encounters:  03/21/17 200 lb 12.8 oz (91.1 kg)  03/20/17 197 lb (89.4 kg)  03/16/17 193 lb (87.5 kg)     GEN:  Well nourished,  well developed in no acute distress HEENT: Normal NECK: No JVD; No carotid bruits LYMPHATICS: No lymphadenopathy CARDIAC: RRR, no murmurs, no rubs, no gallops RESPIRATORY:  Clear to auscultation without rales, wheezing or rhonchi  ABDOMEN: Soft, non-tender, non-distended MUSCULOSKELETAL:  No edema; No deformity  SKIN: Warm and dry LOWER EXTREMITIES: no swelling NEUROLOGIC:  Alert and oriented x 3 PSYCHIATRIC:  Normal affect   ASSESSMENT:    1. Precordial pain   2. Type 2 diabetes mellitus without complication, without long-term current use of insulin ([place])   3. Obstructive sleep apnea syndrome   4. COPD with asthma ([place])   5. Smoking    PLAN:    In order of problems listed above:  Pain in the right forearm. CT showing possibly either occlusion or spasm of the right radial artery. I do not have a Doppler probe in my office but I will refer her for ultrasounds the radial artery. I will give her some pain medication. I asked her to use some warm compresses on the arm. Coronary artery disease: Only nonobstructive. Clearly does not explain why she's having pain in the chest. COPD obviously she needs to quit smoking. He had multiple discussions regarding that issue. Medication Adjustments/Labs and Tests Ordered: Current medicines are reviewed at length with the patient today.  Concerns regarding medicines are outlined above.  No orders of the defined types were placed in  this encounter.  Medication changes: No orders of the defined types were placed in this encounter.   Signed, Park Liter, MD, [facility] 03/21/2017 3:08 PM    [facility]

## 2017-03-21 NOTE — Telephone Encounter (Signed)
Wants her to see Dr Raliegh Ip today.  Went to [facility] ER last night and the dye from her cardiolite is causing her to have a clot in her arm???

## 2017-03-21 NOTE — Patient Instructions (Signed)
Medication Instructions:  Your physician recommends that you continue on your current medications as directed. Please refer to the Current Medication list given to you today.  Labwork: None   Testing/Procedures: Your physician has requested that you have a lower or upper extremity arterial duplex. This test is an ultrasound of the arteries in the legs or arms. It looks at arterial blood flow in the legs and arms. Allow one hour for Lower and Upper Arterial scans. There are no restrictions or special instructions  Follow-Up: Your physician recommends that you schedule a follow-up appointment in: 1 month with Dr. Agustin Cree. Any Other Special Instructions Will Be Listed Below (If Applicable).     If you need a refill on your cardiac medications before your next appointment, please call your pharmacy.

## 2017-03-21 NOTE — Telephone Encounter (Signed)
Appointment arranged for f/u today at 2:40.

## 2017-03-21 NOTE — Progress Notes (Signed)
Brief cardiology note  Call re: pain near site of recent radial cath. Patient seen, very tender over radial site with slight bruising noted. Strong, palpable radial and ulnar pulses. Finger sensation intact, good strength, warm throughout. Compartment soft. CTA done with concern for distal perfusion defect, but per ER discussed w/radiology and vascular who favor spasm given good pulses.  Patient had already received instructions on elevation, pain control, etc. Reinforced red flag symptoms that necessitate immediate medical attention, patient understands and will comply.  Will attempt to contact Dr. Wendy Poet office to schedule close follow up.  Buford Dresser, MD, PhD, overnight cardiology provider

## 2017-03-22 ENCOUNTER — Telehealth: Payer: Self-pay

## 2017-03-22 ENCOUNTER — Ambulatory Visit (INDEPENDENT_AMBULATORY_CARE_PROVIDER_SITE_OTHER): Payer: Medicare Other | Admitting: Allergy

## 2017-03-22 ENCOUNTER — Encounter: Payer: Self-pay | Admitting: Allergy

## 2017-03-22 VITALS — BP 110/64 | HR 72 | Resp 20

## 2017-03-22 DIAGNOSIS — D839 Common variable immunodeficiency, unspecified: Secondary | ICD-10-CM

## 2017-03-22 DIAGNOSIS — J449 Chronic obstructive pulmonary disease, unspecified: Secondary | ICD-10-CM | POA: Diagnosis not present

## 2017-03-22 NOTE — Patient Instructions (Addendum)
Common Variable Immunodeficiency (CVID)    - doing well on month IVIG infusions without any infections!      - we will recheck your IgG level prior to your infusion in September to ensure it is in a good range     - advise to maintain good hydration status especially prior to your infusions.       - you will most likely need infusions life-long.       - discussed alternative options for IgG replacement including subcutaneous infusions.  Let us know if you ever want to switch over to this option in the future.  Can visit  http://[email or web address].com/patients/default.aspx  to learn more about doing at-home infusions of IgG    Follow-up in 6 months or sooner if needed 

## 2017-03-22 NOTE — Telephone Encounter (Signed)
P/c with patient partner, wrong test done and still in pain wants to speak to doctor about what to do . , per Estill Bamberg will discuss with  [place] and call patient back.cn

## 2017-03-22 NOTE — Progress Notes (Signed)
Follow-up Note  RE: Kathy Moss MRN: [id number] DOB: 03-22-1961 Date of Office Visit: 03/22/2017   History of present illness: Kathy Moss is a 56 y.o. female presenting today for follow-up of COPD. She presents today with her partner. She was last seen in the office 11/16/2016. Since last visit she did undergo back surgery in March and it was arranged that she will receive her IVIG infusion while in the hospital. The hospital carried [facility] as if IVIG replacement and thus this is what was used while in the hospital. I do not have records of what occurred while she was in the hospital but per patient and her partner the nurse infusing the IVIG reported that they were going to increase the rate of the infusion to be able to conclude in 2 hours instead of her normal 4 hours that she does in her infusion center. Shortly after her infusion was done apart reports she had a seizure and an anaphylactic reaction with hypotension, bradycardia and was treated for reaction and transferred to the ICU where she stayed for several days. She does not remember anything of it happened.  This particular IVIG product has been listed as an allergy in her allergy list. She was able to resume her regular monthly infusions continues to tolerate those well. She reports they infuse in about 4 hours. Since started on IVIG she has not had any further infections or any need for antibiotic use. Both she and her partner are very happy about this. She does report that she has a bit more energy and overall feels better on IVIG therapy.  He follows with cardiology and has been having issues with her right arm with lots of pain following a heart catheterization. She is currently on pain medicine to help with her pain control.  She states that her COPD/asthma has been stable      Review of systems: Review of Systems  Constitutional: Positive for malaise/fatigue. Negative for chills and fever.  HENT: Negative for  congestion, ear pain, sinus pain and sore throat.   Eyes: Negative for discharge and redness.  Respiratory: Positive for cough and shortness of breath.   Cardiovascular: Negative for chest pain.  Gastrointestinal: Negative for constipation, diarrhea, nausea and vomiting.  Skin: Negative for itching and rash.  Neurological: Negative for headaches.  Endo/Heme/Allergies: Bruises/bleeds easily.    All other systems negative unless noted above in HPI  Past medical/social/surgical/family history have been reviewed and are unchanged unless specifically indicated below.  No changes  Medication List: Allergies as of 03/22/2017      Reactions   Carimune [immune Globulin] Anaphylaxis   Imitrex [sumatriptan] Palpitations   Morphine Itching   Tramadol Palpitations   Adhesive [tape] Other (See Comments)   TAKES SKIN OFF (paper/silk tape) MEDICAL TAPE CAUSES BRUISES AND TEARS SKIN,  PAPER TAPE IS OK   Amitriptyline Palpitations   Buprenorphine Hcl Itching   Morphine And Related Itching      Medication List       Accurate as of 03/22/17  4:45 PM. Always use your most recent med list.          albuterol 108 (90 Base) MCG/ACT inhaler Commonly known as:  PROVENTIL HFA;VENTOLIN HFA Inhale 1-2 puffs into the lungs every 6 (six) hours as needed for wheezing or shortness of breath.   ALPRAZolam 0.5 MG tablet Commonly known as:  XANAX Take 0.5 mg by mouth every 8 (eight) hours as needed for anxiety.   aspirin  EC 81 MG tablet Take 81 mg by mouth daily.   azelastine 0.1 % nasal spray Commonly known as:  ASTELIN Place 2 sprays into both nostrils 2 (two) times daily. Use in each nostril as directed   BREO ELLIPTA 200-25 MCG/INH Aepb Generic drug:  fluticasone furoate-vilanterol Inhale 1 puff into the lungs daily.   butalbital-acetaminophen-caffeine 50-325-40 MG tablet Commonly known as:  FIORICET, ESGIC TAKE 1-2 TABLET(S) BY MOUTH EVERY 4 HOURS AS NEEDED FOR PAIN.   diltiazem 120 MG  24 hr tablet Commonly known as:  CARDIZEM LA Take 120 mg by mouth daily.   esomeprazole 40 MG capsule Commonly known as:  NEXIUM Take 40 mg by mouth 2 (two) times daily before a meal.   eszopiclone 3 MG Tabs Generic drug:  Eszopiclone Take 3 mg by mouth at bedtime. Take immediately before bedtime   FISH OIL PO Take 2,000 mg by mouth 2 (two) times daily.   fluticasone 50 MCG/ACT nasal spray Commonly known as:  FLONASE Place 1 spray into both nostrils daily.   gabapentin 300 MG capsule Commonly known as:  NEURONTIN Take 600 mg by mouth 3 (three) times daily.   insulin glargine 100 UNIT/ML injection Commonly known as:  LANTUS Inject into the skin at bedtime as needed (high blood sugar).   insulin lispro 100 UNIT/ML injection Commonly known as:  HUMALOG Inject 10 Units into the skin 3 (three) times daily as needed for high blood sugar (for high blood sugar greather than 150).   lacosamide 200 MG Tabs tablet Commonly known as:  VIMPAT Take 200 mg by mouth 2 (two) times daily.   lamoTRIgine 25 MG tablet Commonly known as:  LAMICTAL Take 50 mg by mouth 2 (two) times daily.   nitroGLYCERIN 0.4 MG SL tablet Commonly known as:  NITROSTAT Place 1 tablet (0.4 mg total) under the tongue every 5 (five) minutes as needed for chest pain.   nystatin 100000 UNIT/ML suspension Commonly known as:  MYCOSTATIN Take 5 mLs by mouth 3 (three) times daily as needed (FOR THRUSH).   olmesartan 20 MG tablet Commonly known as:  BENICAR Take 20 mg by mouth daily.   oxycodone-acetaminophen 2.5-325 MG tablet Commonly known as:  PERCOCET Take 1 tablet by mouth every 4 (four) hours as needed for pain.   PRIVIGEN IV Inject 30 g into the vein every 28 (twenty-eight) days.   promethazine 25 MG tablet Commonly known as:  PHENERGAN Take 25 mg by mouth every 6 (six) hours as needed for nausea or vomiting.   ranitidine 300 MG tablet Commonly known as:  ZANTAC Take 300 mg by mouth at bedtime.     ranolazine 500 MG 12 hr tablet Commonly known as:  RANEXA Take 1 tablet (500 mg total) by mouth 2 (two) times daily.   rOPINIRole 1 MG tablet Commonly known as:  REQUIP Take 3 mg by mouth at bedtime.   rosuvastatin 5 MG tablet Commonly known as:  CRESTOR Take 5 mg by mouth daily at 6 PM.   tiotropium 18 MCG inhalation capsule Commonly known as:  SPIRIVA Place 18 mcg into inhaler and inhale daily.   tiZANidine 4 MG capsule Commonly known as:  ZANAFLEX Take 4 mg by mouth 3 (three) times daily.   Vitamin D (Ergocalciferol) 50000 units Caps capsule Commonly known as:  DRISDOL Take 50,000 Units by mouth every Tuesday.       Known medication allergies: Allergies  Allergen Reactions  . Carimune [Immune Globulin] Anaphylaxis  . Imitrex [Sumatriptan]  Palpitations  . Morphine Itching  . Tramadol Palpitations  . Adhesive [Tape] Other (See Comments)    TAKES SKIN OFF (paper/silk tape) MEDICAL TAPE CAUSES BRUISES AND TEARS SKIN,  PAPER TAPE IS OK  . Amitriptyline Palpitations  . Buprenorphine Hcl Itching  . Morphine And Related Itching     Physical examination: Blood pressure 110/64, pulse 72, resp. rate 20.  General: Alert, interactive, in no acute distress. HEENT: PERRLA, TMs pearly gray, turbinates minimally edematous without discharge, post-pharynx non erythematous. Neck: Supple without lymphadenopathy. Lungs: Mildly decreased breath sounds bilaterally without wheezing, rhonchi or rales. {no increased work of breathing. CV: Normal S1, S2 without murmurs. Abdomen: Nondistended, nontender. Skin: Right inner forearm with mild erythema with 3 or 4 small puncture wound sites with purplish bruising.. Extremities:  No clubbing, cyanosis or edema. Neuro:   Grossly intact.  Diagnositics/Labs:  Spirometry: FEV1: 1.86L  66%, FVC: 2.46L  69%, ratio consistent with Restrictive pattern. Study is slightly improved in FEV1 from previous  Assessment and plan:   Common Variable  Immunodeficiency (CVID)    - doing well on monthly IVIG infusions without any infections or antibiotic needs!      - we will recheck your IgG level prior to your infusion in September to ensure it is in a good range      - advise to maintain good hydration status especially prior to your infusions.      - spirometry remains stable.  If she ever has a significant decline in her lung function will consider obtaining a CT scan to assess for bronchiectasis and emphysema given her history of COPD.      - Discussed again today that IVIG replacement therapy is a lifelong therapy      - discussed alternative options for IgG replacement including subcutaneous infusions.  Let us know if you ever want to switch over to this option in the future.  Can visit  [email or web address].[email or web address]  to learn more about doing at-home infusions of IgG    Follow-up in 6 months or sooner if needed  I appreciate the opportunity to take part in Kathy Moss care. Please do not hesitate to contact me with questions.  Sincerely,   Prudy Feeler, MD Allergy/Immunology [facility]

## 2017-03-22 NOTE — Telephone Encounter (Signed)
Dr. Agustin Cree has called and spoke with family directly. No additional orders will be placed at this time per Dr. Agustin Cree.

## 2017-03-23 ENCOUNTER — Ambulatory Visit (HOSPITAL_COMMUNITY): Admission: RE | Admit: 2017-03-23 | Payer: Medicare Other | Source: Ambulatory Visit

## 2017-03-23 ENCOUNTER — Ambulatory Visit: Payer: Medicare Other | Admitting: Cardiology

## 2017-03-24 DIAGNOSIS — G4733 Obstructive sleep apnea (adult) (pediatric): Secondary | ICD-10-CM | POA: Diagnosis not present

## 2017-03-24 DIAGNOSIS — R2689 Other abnormalities of gait and mobility: Secondary | ICD-10-CM | POA: Diagnosis not present

## 2017-03-24 DIAGNOSIS — G40211 Localization-related (focal) (partial) symptomatic epilepsy and epileptic syndromes with complex partial seizures, intractable, with status epilepticus: Secondary | ICD-10-CM | POA: Diagnosis not present

## 2017-03-24 DIAGNOSIS — J449 Chronic obstructive pulmonary disease, unspecified: Secondary | ICD-10-CM | POA: Diagnosis not present

## 2017-03-24 DIAGNOSIS — M5136 Other intervertebral disc degeneration, lumbar region: Secondary | ICD-10-CM | POA: Diagnosis not present

## 2017-03-24 DIAGNOSIS — E119 Type 2 diabetes mellitus without complications: Secondary | ICD-10-CM | POA: Diagnosis not present

## 2017-03-25 DIAGNOSIS — M4326 Fusion of spine, lumbar region: Secondary | ICD-10-CM | POA: Diagnosis not present

## 2017-03-25 DIAGNOSIS — M5136 Other intervertebral disc degeneration, lumbar region: Secondary | ICD-10-CM | POA: Diagnosis not present

## 2017-03-25 DIAGNOSIS — M545 Low back pain: Secondary | ICD-10-CM | POA: Diagnosis not present

## 2017-03-25 DIAGNOSIS — G894 Chronic pain syndrome: Secondary | ICD-10-CM | POA: Diagnosis not present

## 2017-03-27 ENCOUNTER — Telehealth: Payer: Self-pay | Admitting: Internal Medicine

## 2017-03-27 NOTE — Telephone Encounter (Signed)
Returned page from patient's partner. We had an extensive discussion about her frustration with Rei's arm pain. She states that the pain has been worsening over the past week. Her arm and wrist are very painful to touch and any movement causes her pain in the arm and hand. She has been using Percocet, which helps with the pain, however she is down to her last tablet. She states that there is "a knot" that she can see on the medial aspect of the wrist, not near the site of radial artery puncture. This is tender and bruised as well. She has normal movement in her fingers (though limited by pain) and normal sensation in her fingers and hand. She has had no fevers or chills. There is no drainage from the skin puncture site and she does not believe there is an underlying fluid collection.   I instructed the patient to keep warm compresses on the affected area and keep it elevated. I offered to see her in the emergency department tonight to evaluate the wrist and to give her more Percocet. She expressed a preference to come first thing in the morning tomorrow so that she can have her arterial doppler study and see the interventional cardiology team. I told her that if any of her symptoms worsen tonight or if she develops any new symptoms then she should come into the ED to be seen overnight.   Message cc'd to patient's outpatient cardiology team  Lanna Poche, MD Cardiology Fellow

## 2017-03-28 ENCOUNTER — Telehealth: Payer: Self-pay | Admitting: Cardiology

## 2017-03-28 NOTE — Telephone Encounter (Signed)
Message routed to [facility] for review.

## 2017-03-28 NOTE — Telephone Encounter (Signed)
Her arm still hurts and is still swollen

## 2017-03-28 NOTE — Telephone Encounter (Signed)
Message has been reviewed by Dr. Agustin Cree.

## 2017-03-29 ENCOUNTER — Telehealth: Payer: Self-pay

## 2017-03-29 ENCOUNTER — Encounter: Payer: Self-pay | Admitting: Vascular Surgery

## 2017-03-29 DIAGNOSIS — R2689 Other abnormalities of gait and mobility: Secondary | ICD-10-CM | POA: Diagnosis not present

## 2017-03-29 DIAGNOSIS — M79631 Pain in right forearm: Secondary | ICD-10-CM

## 2017-03-29 DIAGNOSIS — E119 Type 2 diabetes mellitus without complications: Secondary | ICD-10-CM | POA: Diagnosis not present

## 2017-03-29 DIAGNOSIS — G40211 Localization-related (focal) (partial) symptomatic epilepsy and epileptic syndromes with complex partial seizures, intractable, with status epilepticus: Secondary | ICD-10-CM | POA: Diagnosis not present

## 2017-03-29 DIAGNOSIS — J449 Chronic obstructive pulmonary disease, unspecified: Secondary | ICD-10-CM | POA: Diagnosis not present

## 2017-03-29 DIAGNOSIS — M5136 Other intervertebral disc degeneration, lumbar region: Secondary | ICD-10-CM | POA: Diagnosis not present

## 2017-03-29 DIAGNOSIS — M79601 Pain in right arm: Secondary | ICD-10-CM

## 2017-03-29 DIAGNOSIS — G4733 Obstructive sleep apnea (adult) (pediatric): Secondary | ICD-10-CM | POA: Diagnosis not present

## 2017-03-29 NOTE — Telephone Encounter (Signed)
-----   Message from Jettie Booze, MD sent at 03/29/2017 12:23 PM EDT ----- Corey Skains,  I cannot explain her pain based on an occluded radial artery.  I think she should see vascular surgery.  I will have my office set up a referral to the vascular surgeon in [place].   JV ----- Message ----- From: Kathyrn Sheriff, RN Sent: 03/28/2017   4:07 PM To: Jettie Booze, MD  Hello Dr. Irish Lack,   The above pt recently had a Left Heart Cath you had performed and has has had c/o arm pain since the cath. Dr. Agustin Cree brought pt to the office for evaluation and Arterial Duplex was ordered at which a venous duplex was performed. Which noted partially ocluded radial artery. Pts spouse has called to the office multiple times to report constant pain at which Dr. Agustin Cree has spoke with them on the phone and has visited the ED as well. Dr. Agustin Cree would like for you to evaluate the pt if possible.   I am not sure who your nurse is or else I would have message her as well.   Thanks,  Corey Skains, Therapist, sports

## 2017-03-29 NOTE — Telephone Encounter (Signed)
I'm not sure why she would have such intense pain from radial artery closure given the presence of ulnar flow.

## 2017-03-29 NOTE — Telephone Encounter (Signed)
pts spouse advised of this information.

## 2017-03-29 NOTE — Telephone Encounter (Signed)
Referral placed for vascular surgery

## 2017-03-30 ENCOUNTER — Ambulatory Visit (INDEPENDENT_AMBULATORY_CARE_PROVIDER_SITE_OTHER): Payer: Medicare Other | Admitting: Vascular Surgery

## 2017-03-30 ENCOUNTER — Encounter: Payer: Self-pay | Admitting: Vascular Surgery

## 2017-03-30 VITALS — BP 145/81 | HR 70 | Temp 97.8°F | Resp 20 | Ht 65.0 in | Wt 202.0 lb

## 2017-03-30 DIAGNOSIS — R2689 Other abnormalities of gait and mobility: Secondary | ICD-10-CM | POA: Diagnosis not present

## 2017-03-30 DIAGNOSIS — G40211 Localization-related (focal) (partial) symptomatic epilepsy and epileptic syndromes with complex partial seizures, intractable, with status epilepticus: Secondary | ICD-10-CM | POA: Diagnosis not present

## 2017-03-30 DIAGNOSIS — M5136 Other intervertebral disc degeneration, lumbar region: Secondary | ICD-10-CM | POA: Diagnosis not present

## 2017-03-30 DIAGNOSIS — J449 Chronic obstructive pulmonary disease, unspecified: Secondary | ICD-10-CM | POA: Diagnosis not present

## 2017-03-30 DIAGNOSIS — G4733 Obstructive sleep apnea (adult) (pediatric): Secondary | ICD-10-CM | POA: Diagnosis not present

## 2017-03-30 DIAGNOSIS — E119 Type 2 diabetes mellitus without complications: Secondary | ICD-10-CM | POA: Diagnosis not present

## 2017-03-30 DIAGNOSIS — I70208 Unspecified atherosclerosis of native arteries of extremities, other extremity: Secondary | ICD-10-CM

## 2017-03-30 NOTE — Progress Notes (Signed)
Patient name: Kathy Moss MRN: [id number] DOB: 1961-03-17 Sex: female   REASON FOR CONSULT:    Right forearm pain and swelling. The consult is requested by Dr. Irish Lack.   HPI:   Kathy Moss is a pleasant 56 y.o. female,  who was referred with right forearm pain and swelling. This patient had been having chest pain and palpitations. This prompted coronary catheterization and she reportedly had mild coronary disease. This was performed via a right radial approach on 03/16/2017. She had some mild swelling after the procedure but 2 days after the procedure developed increased pain in the right forearm and swelling. These symptoms have persisted for the last 2 weeks and she is referred for vascular consultation.   Her symptoms have remained stable over the last 2 weeks. Her complaint is pain over the right forearm. There are no aggravating or alleviating factors. There is no real pain in the hand itself.  Past Medical History:  Diagnosis Date  . Angina at rest [facility]) 2013  . Asthma   . Hypertension   . Recurrent upper respiratory infection (URI)   . Seizures ([place])   . Type 2 diabetes mellitus without complication, without long-term current use of insulin ([place]) 06/30/2016    Family History  Problem Relation Age of Onset  . High blood pressure Mother   . Lung cancer Mother   . Seizures Mother   . Stroke Mother   . Asthma Mother   . Allergic rhinitis Brother   . COPD Brother     SOCIAL HISTORY: Social History   Social History  . Marital status: Married    Spouse name: N/A  . Number of children: N/A  . Years of education: N/A   Occupational History  . Not on file.   Social History Main Topics  . Smoking status: Current Every Day Smoker    Packs/day: 0.50    Years: 36.00    Types: Cigarettes  . Smokeless tobacco: Never Used  . Alcohol use No  . Drug use: No  . Sexual activity: Not on file   Other Topics Concern  . Not on file   Social History Narrative  . No  narrative on file    Allergies  Allergen Reactions  . Carimune [Immune Globulin] Anaphylaxis  . Imitrex [Sumatriptan] Palpitations  . Morphine Itching  . Tramadol Palpitations  . Adhesive [Tape] Other (See Comments)    TAKES SKIN OFF (paper/silk tape) MEDICAL TAPE CAUSES BRUISES AND TEARS SKIN,  PAPER TAPE IS OK  . Amitriptyline Palpitations  . Buprenorphine Hcl Itching  . Morphine And Related Itching    Current Outpatient Prescriptions  Medication Sig Dispense Refill  . albuterol (PROVENTIL HFA;VENTOLIN HFA) 108 (90 Base) MCG/ACT inhaler Inhale 1-2 puffs into the lungs every 6 (six) hours as needed for wheezing or shortness of breath.    . ALPRAZolam (XANAX) 0.5 MG tablet Take 0.5 mg by mouth every 8 (eight) hours as needed for anxiety.     Marland Kitchen aspirin EC 81 MG tablet Take 81 mg by mouth daily.    Marland Kitchen azelastine (ASTELIN) 0.1 % nasal spray Place 2 sprays into both nostrils 2 (two) times daily. Use in each nostril as directed     . butalbital-acetaminophen-caffeine (FIORICET, ESGIC) 50-325-40 MG tablet TAKE 1-2 TABLET(S) BY MOUTH EVERY 4 HOURS AS NEEDED FOR PAIN.    Marland Kitchen diltiazem (CARDIZEM LA) 120 MG 24 hr tablet Take 120 mg by mouth daily.     Marland Kitchen esomeprazole ([place])  40 MG capsule Take 40 mg by mouth 2 (two) times daily before a meal.     . Eszopiclone (ESZOPICLONE) 3 MG TABS Take 3 mg by mouth at bedtime. Take immediately before bedtime    . fluticasone (FLONASE) 50 MCG/ACT nasal spray Place 1 spray into both nostrils daily.    . fluticasone furoate-vilanterol (BREO ELLIPTA) 200-25 MCG/INH AEPB Inhale 1 puff into the lungs daily.     Marland Kitchen gabapentin (NEURONTIN) 300 MG capsule Take 600 mg by mouth 3 (three) times daily.     . Immune Globulin, Human, (PRIVIGEN IV) Inject 30 g into the vein every 28 (twenty-eight) days.    . insulin glargine (LANTUS) 100 UNIT/ML injection Inject into the skin at bedtime as needed (high blood sugar).    . insulin lispro (HUMALOG) 100 UNIT/ML injection Inject 10  Units into the skin 3 (three) times daily as needed for high blood sugar (for high blood sugar greather than 150).    Marland Kitchen lacosamide (VIMPAT) 200 MG TABS tablet Take 200 mg by mouth 2 (two) times daily.    Marland Kitchen lamoTRIgine (LAMICTAL) 25 MG tablet Take 50 mg by mouth 2 (two) times daily.     . nitroGLYCERIN (NITROSTAT) 0.4 MG SL tablet Place 1 tablet (0.4 mg total) under the tongue every 5 (five) minutes as needed for chest pain. 30 tablet 5  . nystatin (MYCOSTATIN) 100000 UNIT/ML suspension Take 5 mLs by mouth 3 (three) times daily as needed (FOR THRUSH).     Marland Kitchen olmesartan (BENICAR) 20 MG tablet Take 20 mg by mouth daily.    . Omega-3 Fatty Acids (FISH OIL PO) Take 2,000 mg by mouth 2 (two) times daily.     Marland Kitchen oxycodone-acetaminophen (PERCOCET) 2.5-325 MG tablet Take 1 tablet by mouth every 4 (four) hours as needed for pain. 15 tablet 0  . promethazine (PHENERGAN) 25 MG tablet Take 25 mg by mouth every 6 (six) hours as needed for nausea or vomiting.    . ranitidine (ZANTAC) 300 MG tablet Take 300 mg by mouth at bedtime.    . ranolazine (RANEXA) 500 MG 12 hr tablet Take 1 tablet (500 mg total) by mouth 2 (two) times daily. 60 tablet 6  . rOPINIRole (REQUIP) 1 MG tablet Take 3 mg by mouth at bedtime.     . rosuvastatin (CRESTOR) 5 MG tablet Take 5 mg by mouth daily at 6 PM.    . tiotropium (SPIRIVA) 18 MCG inhalation capsule Place 18 mcg into inhaler and inhale daily.    Marland Kitchen tiZANidine (ZANAFLEX) 4 MG capsule Take 4 mg by mouth 3 (three) times daily.    . Vitamin D, Ergocalciferol, (DRISDOL) 50000 units CAPS capsule Take 50,000 Units by mouth every Tuesday.      No current facility-administered medications for this visit.     REVIEW OF SYSTEMS:  [X]  denotes positive finding, [ ]  denotes negative finding Cardiac  Comments:  Chest pain or chest pressure:    Shortness of breath upon exertion: X   Short of breath when lying flat: X   Irregular heart rhythm: X       Vascular    Pain in calf, thigh, or  hip brought on by ambulation:    Pain in feet at night that wakes you up from your sleep:     Blood clot in your veins:    Leg swelling:  X       Pulmonary    Oxygen at home: X   Productive cough:  Wheezing:  X       Neurologic    Sudden weakness in arms or legs:  X   Sudden numbness in arms or legs:  X   Sudden onset of difficulty speaking or slurred speech:    Temporary loss of vision in one eye:     Problems with dizziness:         Gastrointestinal    Blood in stool:  X   Vomited blood:         Genitourinary    Burning when urinating:     Blood in urine:        Psychiatric    Major depression:         Hematologic    Bleeding problems:    Problems with blood clotting too easily:        Skin    Rashes or ulcers:        Constitutional    Fever or chills:     PHYSICAL EXAM:   Vitals:   03/30/17 1434  BP: (!) 145/81  Pulse: 70  Resp: 20  Temp: 97.8 F (36.6 C)  TempSrc: Oral  SpO2: 90%  Weight: 202 lb (91.6 kg)  Height: 5\' 5"  (1.651 m)    GENERAL: The patient is a well-nourished female, in no acute distress. The vital signs are documented above. CARDIAC: There is a regular rate and rhythm.  VASCULAR: I do not detect carotid bruits. In the right upper extremity, which is the symptomatic side, she has a palpable ulnar and brachial pulse. I cannot palpate a right radial pulse. She has a biphasic Doppler signal in the palmar arch and proximal radial artery in the forearm. There is a monophasic radial signal at the wrist. On the left side, she has a palpable brachial, radial, and ulnar pulse. PULMONARY: There is good air exchange bilaterally without wheezing or rales. MUSCULOSKELETAL: The right forearm is soft with no evidence of increased pressure or compartment syndrome. There is some ecchymosis in the distal forearm. NEUROLOGIC: She has a good grip with her right hand and no sensory deficit. SKIN: There are no ulcers or rashes noted. PSYCHIATRIC: The  patient has a normal affect.  DATA:    RIGHT UPPER EXTREMITY VENOUS DUPLEX: I have reviewed the right upper extremity venous duplex scan that was done on 03/21/2017. There was no evidence of DVT in the right upper extremity.  MEDICAL ISSUES:   RIGHT FOREARM PAIN: This patient has right forearm pain which has been present for approximately 2 weeks. She has a normal pulmar arch signal and a palpable ulnar artery pulse. She has biphasic Doppler flow in the proximal radial artery. Thus I do not think that the pain is from ischemia. I think this pain is neurogenic, possibly related to some bruising or hematoma. I do not think there is any evidence of a compartment syndrome. The forearm is soft and there is no motor or sensory compromise. I think that her symptoms will gradually improve with time. I have explained that if her symptoms do not improve that I would recommend referral to one of the hand surgeons for evaluation.  I have encouraged her to elevate her arm as she has had some swelling.   Deitra Mayo [facility] [phone]

## 2017-03-31 ENCOUNTER — Telehealth: Payer: Self-pay

## 2017-03-31 DIAGNOSIS — M79601 Pain in right arm: Secondary | ICD-10-CM

## 2017-03-31 NOTE — Telephone Encounter (Signed)
Verbal order from Dr. Agustin Cree to ref to neuro for evaluation of arm pain s/p heart cath.

## 2017-04-01 ENCOUNTER — Telehealth: Payer: Self-pay

## 2017-04-01 ENCOUNTER — Encounter: Payer: Self-pay | Admitting: Neurology

## 2017-04-01 NOTE — Telephone Encounter (Signed)
Patient wife called stating need sooner appt , asked that [facility] call her.cn

## 2017-04-02 DIAGNOSIS — G4733 Obstructive sleep apnea (adult) (pediatric): Secondary | ICD-10-CM | POA: Diagnosis not present

## 2017-04-02 DIAGNOSIS — Z794 Long term (current) use of insulin: Secondary | ICD-10-CM | POA: Diagnosis not present

## 2017-04-02 DIAGNOSIS — Z7952 Long term (current) use of systemic steroids: Secondary | ICD-10-CM | POA: Diagnosis not present

## 2017-04-02 DIAGNOSIS — J449 Chronic obstructive pulmonary disease, unspecified: Secondary | ICD-10-CM | POA: Diagnosis not present

## 2017-04-02 DIAGNOSIS — F17210 Nicotine dependence, cigarettes, uncomplicated: Secondary | ICD-10-CM | POA: Diagnosis not present

## 2017-04-02 DIAGNOSIS — Z9981 Dependence on supplemental oxygen: Secondary | ICD-10-CM | POA: Diagnosis not present

## 2017-04-02 DIAGNOSIS — Z9181 History of falling: Secondary | ICD-10-CM | POA: Diagnosis not present

## 2017-04-02 DIAGNOSIS — M5136 Other intervertebral disc degeneration, lumbar region: Secondary | ICD-10-CM | POA: Diagnosis not present

## 2017-04-02 DIAGNOSIS — E119 Type 2 diabetes mellitus without complications: Secondary | ICD-10-CM | POA: Diagnosis not present

## 2017-04-02 DIAGNOSIS — G40211 Localization-related (focal) (partial) symptomatic epilepsy and epileptic syndromes with complex partial seizures, intractable, with status epilepticus: Secondary | ICD-10-CM | POA: Diagnosis not present

## 2017-04-02 DIAGNOSIS — Z79891 Long term (current) use of opiate analgesic: Secondary | ICD-10-CM | POA: Diagnosis not present

## 2017-04-02 DIAGNOSIS — R2689 Other abnormalities of gait and mobility: Secondary | ICD-10-CM | POA: Diagnosis not present

## 2017-04-04 ENCOUNTER — Encounter: Payer: Self-pay | Admitting: Cardiology

## 2017-04-04 DIAGNOSIS — M5136 Other intervertebral disc degeneration, lumbar region: Secondary | ICD-10-CM | POA: Diagnosis not present

## 2017-04-04 DIAGNOSIS — R2689 Other abnormalities of gait and mobility: Secondary | ICD-10-CM | POA: Diagnosis not present

## 2017-04-04 DIAGNOSIS — G40211 Localization-related (focal) (partial) symptomatic epilepsy and epileptic syndromes with complex partial seizures, intractable, with status epilepticus: Secondary | ICD-10-CM | POA: Diagnosis not present

## 2017-04-04 DIAGNOSIS — G4733 Obstructive sleep apnea (adult) (pediatric): Secondary | ICD-10-CM | POA: Diagnosis not present

## 2017-04-04 DIAGNOSIS — E119 Type 2 diabetes mellitus without complications: Secondary | ICD-10-CM | POA: Diagnosis not present

## 2017-04-04 DIAGNOSIS — J449 Chronic obstructive pulmonary disease, unspecified: Secondary | ICD-10-CM | POA: Diagnosis not present

## 2017-04-04 NOTE — Addendum Note (Signed)
Addended by: Kathyrn Sheriff on: 04/04/2017 11:00 AM   Modules accepted: Orders

## 2017-04-04 NOTE — Telephone Encounter (Signed)
S.W referrals at [facility] and advised of the patients situation and need for referral. I am unable to get pt into any neurology office until September or November even after the providers have reviewed the case. Message left with Pam on Friday regarding the inability to arrange sooner appointment. The earliest [facility] could offer appointment as of now is Sept. 7. Referral has been sent to [facility].

## 2017-04-05 ENCOUNTER — Other Ambulatory Visit (HOSPITAL_COMMUNITY): Payer: Medicare Other

## 2017-04-05 DIAGNOSIS — H5712 Ocular pain, left eye: Secondary | ICD-10-CM | POA: Diagnosis not present

## 2017-04-05 DIAGNOSIS — H353131 Nonexudative age-related macular degeneration, bilateral, early dry stage: Secondary | ICD-10-CM | POA: Diagnosis not present

## 2017-04-05 DIAGNOSIS — E119 Type 2 diabetes mellitus without complications: Secondary | ICD-10-CM | POA: Diagnosis not present

## 2017-04-06 ENCOUNTER — Ambulatory Visit: Payer: Medicare Other | Admitting: Cardiology

## 2017-04-06 ENCOUNTER — Encounter: Payer: Medicare Other | Admitting: Vascular Surgery

## 2017-04-06 DIAGNOSIS — J449 Chronic obstructive pulmonary disease, unspecified: Secondary | ICD-10-CM | POA: Diagnosis not present

## 2017-04-06 DIAGNOSIS — G4733 Obstructive sleep apnea (adult) (pediatric): Secondary | ICD-10-CM | POA: Diagnosis not present

## 2017-04-06 DIAGNOSIS — E119 Type 2 diabetes mellitus without complications: Secondary | ICD-10-CM | POA: Diagnosis not present

## 2017-04-06 DIAGNOSIS — G40211 Localization-related (focal) (partial) symptomatic epilepsy and epileptic syndromes with complex partial seizures, intractable, with status epilepticus: Secondary | ICD-10-CM | POA: Diagnosis not present

## 2017-04-06 DIAGNOSIS — R2689 Other abnormalities of gait and mobility: Secondary | ICD-10-CM | POA: Diagnosis not present

## 2017-04-06 DIAGNOSIS — M5136 Other intervertebral disc degeneration, lumbar region: Secondary | ICD-10-CM | POA: Diagnosis not present

## 2017-04-07 DIAGNOSIS — H5712 Ocular pain, left eye: Secondary | ICD-10-CM | POA: Diagnosis not present

## 2017-04-07 DIAGNOSIS — M5136 Other intervertebral disc degeneration, lumbar region: Secondary | ICD-10-CM | POA: Diagnosis not present

## 2017-04-07 DIAGNOSIS — D839 Common variable immunodeficiency, unspecified: Secondary | ICD-10-CM | POA: Diagnosis not present

## 2017-04-07 DIAGNOSIS — I10 Essential (primary) hypertension: Secondary | ICD-10-CM

## 2017-04-07 DIAGNOSIS — E119 Type 2 diabetes mellitus without complications: Secondary | ICD-10-CM | POA: Diagnosis not present

## 2017-04-07 DIAGNOSIS — H5711 Ocular pain, right eye: Secondary | ICD-10-CM | POA: Diagnosis not present

## 2017-04-07 HISTORY — DX: Essential (primary) hypertension: I10

## 2017-04-13 DIAGNOSIS — G40211 Localization-related (focal) (partial) symptomatic epilepsy and epileptic syndromes with complex partial seizures, intractable, with status epilepticus: Secondary | ICD-10-CM | POA: Diagnosis not present

## 2017-04-13 DIAGNOSIS — G4733 Obstructive sleep apnea (adult) (pediatric): Secondary | ICD-10-CM | POA: Diagnosis not present

## 2017-04-13 DIAGNOSIS — R2689 Other abnormalities of gait and mobility: Secondary | ICD-10-CM | POA: Diagnosis not present

## 2017-04-13 DIAGNOSIS — E119 Type 2 diabetes mellitus without complications: Secondary | ICD-10-CM | POA: Diagnosis not present

## 2017-04-13 DIAGNOSIS — M5136 Other intervertebral disc degeneration, lumbar region: Secondary | ICD-10-CM | POA: Diagnosis not present

## 2017-04-13 DIAGNOSIS — J449 Chronic obstructive pulmonary disease, unspecified: Secondary | ICD-10-CM | POA: Diagnosis not present

## 2017-04-14 DIAGNOSIS — R51 Headache: Secondary | ICD-10-CM | POA: Diagnosis not present

## 2017-04-14 DIAGNOSIS — H5712 Ocular pain, left eye: Secondary | ICD-10-CM | POA: Diagnosis not present

## 2017-04-14 DIAGNOSIS — G4489 Other headache syndrome: Secondary | ICD-10-CM | POA: Diagnosis not present

## 2017-04-15 DIAGNOSIS — R2689 Other abnormalities of gait and mobility: Secondary | ICD-10-CM | POA: Diagnosis not present

## 2017-04-15 DIAGNOSIS — J449 Chronic obstructive pulmonary disease, unspecified: Secondary | ICD-10-CM | POA: Diagnosis not present

## 2017-04-15 DIAGNOSIS — M5136 Other intervertebral disc degeneration, lumbar region: Secondary | ICD-10-CM | POA: Diagnosis not present

## 2017-04-15 DIAGNOSIS — G4733 Obstructive sleep apnea (adult) (pediatric): Secondary | ICD-10-CM | POA: Diagnosis not present

## 2017-04-15 DIAGNOSIS — E119 Type 2 diabetes mellitus without complications: Secondary | ICD-10-CM | POA: Diagnosis not present

## 2017-04-15 DIAGNOSIS — G40211 Localization-related (focal) (partial) symptomatic epilepsy and epileptic syndromes with complex partial seizures, intractable, with status epilepticus: Secondary | ICD-10-CM | POA: Diagnosis not present

## 2017-04-18 ENCOUNTER — Encounter: Payer: Self-pay | Admitting: Neurology

## 2017-04-18 ENCOUNTER — Ambulatory Visit (INDEPENDENT_AMBULATORY_CARE_PROVIDER_SITE_OTHER): Payer: Medicare Other | Admitting: Neurology

## 2017-04-18 VITALS — BP 109/67 | HR 64 | Ht 65.0 in | Wt 194.2 lb

## 2017-04-18 DIAGNOSIS — M79601 Pain in right arm: Secondary | ICD-10-CM

## 2017-04-18 DIAGNOSIS — R531 Weakness: Secondary | ICD-10-CM | POA: Diagnosis not present

## 2017-04-18 DIAGNOSIS — G40909 Epilepsy, unspecified, not intractable, without status epilepticus: Secondary | ICD-10-CM | POA: Insufficient documentation

## 2017-04-18 HISTORY — DX: Pain in right arm: M79.601

## 2017-04-18 HISTORY — DX: Weakness: R53.1

## 2017-04-18 HISTORY — DX: Epilepsy, unspecified, not intractable, without status epilepticus: G40.909

## 2017-04-18 MED ORDER — LAMOTRIGINE 100 MG PO TABS: 100.0000 mg | ORAL_TABLET | Freq: Two times a day (BID) | ORAL | 11 refills | Status: DC

## 2017-04-18 NOTE — Progress Notes (Signed)
PATIENT: Kathy Moss DOB: Jun 30, 1961  Chief Complaint  Patient presents with  . Right Arm Pain    She is here with her spouse, [name].  Reports right arm pain and weakness since having a cardiac catheterization in July 2018.       HISTORICAL  Kathy Moss is a 56 years old right-handed female, accompanied by her spouse [name], seen in refer by  Her cardiologist Dr. Carol Ada for evaluation of right arm pain, initial evaluation was on April 18 2017.  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 Harrie Foreman, reported daily seizure morning and night, she has body jerk, lasing for 1-2 mintues, could up to 15 mintues,  was confused afterwards..  She also has past medical history of diabetes, become insulin-dependent since early 2018,  She began to complains of right arm pain since her recent cardiac catheterization on March 16 2017, he was to right radial artery with a single anterior needle wall stick,  Patient complains that since the procedure, she has constant right arm pain, numbness tingling of right fifth fingers, radiating to her right forearm, right wrist pain,  She was already evaluated by vascular surgeon Dr. Deitra Mayo on her upper extremity venous and arterial Doppler study showed no significant abnormality.  She complains of persistent pain, difficulty using her right arm,   REVIEW OF SYSTEMS: Full 14 system review of systems performed and notable only for blurry vision, wheezing, snoring, constipation, palpitation, swelling, easy bruising, joint pain, cramps, achy muscles, allergy, memory loss, headaches, numbness, weakness, slurred speech, dizziness, seizure, snoring, restless leg, anxiety, not enough sleep, decreased energy, change in appetite    ALLERGIES: Allergies  Allergen Reactions  . Carimune [Immune Globulin] Anaphylaxis  . Imitrex [Sumatriptan] Palpitations  . Morphine Itching  . Tramadol Palpitations  . Adhesive [Tape] Other (See Comments)    TAKES SKIN OFF (paper/silk tape) MEDICAL TAPE CAUSES BRUISES AND TEARS SKIN,  PAPER TAPE IS OK  . Amitriptyline Palpitations  . Buprenorphine Hcl Itching  . Morphine And Related Itching    HOME MEDICATIONS: Current Outpatient Prescriptions  Medication Sig Dispense Refill  . albuterol (PROVENTIL HFA;VENTOLIN HFA) 108 (90 Base) MCG/ACT inhaler Inhale 1-2 puffs into the lungs every 6 (six) hours as needed for wheezing or shortness of breath.    . ALPRAZolam (XANAX) 0.5 MG tablet Take 0.5 mg by mouth every 8 (eight) hours as needed for anxiety.     Marland Kitchen aspirin EC 81 MG tablet Take 81 mg by mouth daily.    Marland Kitchen azelastine (ASTELIN) 0.1 % nasal spray Place 2 sprays into both nostrils 2 (two) times daily. Use in each nostril as directed     . butalbital-acetaminophen-caffeine (FIORICET, ESGIC) 50-325-40 MG tablet TAKE 1-2 TABLET(S) BY MOUTH EVERY 4 HOURS AS NEEDED FOR PAIN.    Marland Kitchen diltiazem (CARDIZEM LA) 120 MG 24 hr tablet Take 120 mg by mouth daily.     Marland Kitchen esomeprazole (NEXIUM) 40 MG capsule Take 40 mg by mouth 2 (two) times daily before a meal.     . Eszopiclone (ESZOPICLONE) 3 MG TABS Take 3 mg by mouth at bedtime. Take immediately before bedtime    . fluticasone (FLONASE) 50 MCG/ACT nasal spray Place 1 spray into both nostrils daily.    . fluticasone furoate-vilanterol (BREO ELLIPTA)  200-25 MCG/INH AEPB Inhale 1 puff into the lungs daily.     Marland Kitchen gabapentin (NEURONTIN) 300 MG capsule Take 600 mg by mouth 3 (three) times daily.     . Immune Globulin, Human, (PRIVIGEN IV) Inject 30 g into the vein every 28 (twenty-eight) days.    . insulin glargine (LANTUS) 100 UNIT/ML injection Inject into the skin at bedtime as needed (high blood sugar).    . insulin lispro (HUMALOG) 100 UNIT/ML  injection Inject 10 Units into the skin 3 (three) times daily as needed for high blood sugar (for high blood sugar greather than 150).    Marland Kitchen lacosamide (VIMPAT) 200 MG TABS tablet Take 200 mg by mouth 2 (two) times daily.    Marland Kitchen lamoTRIgine (LAMICTAL) 25 MG tablet Take 50 mg by mouth 2 (two) times daily.     . nitroGLYCERIN (NITROSTAT) 0.4 MG SL tablet Place 1 tablet (0.4 mg total) under the tongue every 5 (five) minutes as needed for chest pain. 30 tablet 5  . nystatin (MYCOSTATIN) 100000 UNIT/ML suspension Take 5 mLs by mouth 3 (three) times daily as needed (FOR THRUSH).     Marland Kitchen olmesartan (BENICAR) 20 MG tablet Take 20 mg by mouth daily.    . Omega-3 Fatty Acids (FISH OIL PO) Take 2,000 mg by mouth 2 (two) times daily.     Marland Kitchen oxyCODONE-acetaminophen (PERCOCET) 10-325 MG tablet One tablet every 4 hours as needed for pain.    . promethazine (PHENERGAN) 25 MG tablet Take 25 mg by mouth every 6 (six) hours as needed for nausea or vomiting.    . ranitidine (ZANTAC) 300 MG tablet Take 300 mg by mouth at bedtime.    . ranolazine (RANEXA) 500 MG 12 hr tablet Take 1 tablet (500 mg total) by mouth 2 (two) times daily. 60 tablet 6  . rOPINIRole (REQUIP) 1 MG tablet Take 3 mg by mouth at bedtime.     . rosuvastatin (CRESTOR) 5 MG tablet Take 5 mg by mouth daily at 6 PM.    . tiotropium (SPIRIVA) 18 MCG inhalation capsule Place 18 mcg into inhaler and inhale daily.    Marland Kitchen tiZANidine (ZANAFLEX) 4 MG capsule Take 4 mg by mouth 3 (three) times daily.    . Vitamin D, Ergocalciferol, (DRISDOL) 50000 units CAPS capsule Take 50,000 Units by mouth every Tuesday.      No current facility-administered medications for this visit.     PAST MEDICAL HISTORY: Past Medical History:  Diagnosis Date  . Angina at rest [facility]) 2013  . Asthma   . Hypertension   . Recurrent upper respiratory infection (URI)   . Seizures ([place])   . Type 2 diabetes mellitus without complication, without long-term current use of insulin ([place])  06/30/2016    PAST SURGICAL HISTORY: Past Surgical History:  Procedure Laterality Date  . APPENDECTOMY    . BACK SURGERY    . CHOLECYSTECTOMY    . HERNIA REPAIR    . LEFT HEART CATH AND CORONARY ANGIOGRAPHY N/A 03/16/2017   Procedure: Left Heart Cath and Coronary Angiography;  Surgeon: Jettie Booze, MD;  Location: [place] [facility];  Service: Cardiovascular;  Laterality: N/A;    FAMILY HISTORY: Family History  Problem Relation Age of Onset  . High blood pressure Mother   . Lung cancer Mother   . Seizures Mother   . Stroke Mother   . Asthma Mother   . Allergic rhinitis Brother   . COPD Brother   . Heart attack Father  SOCIAL HISTORY:  Social History   Social History  . Marital status: Married    Spouse name: N/A  . Number of children: 0  . Years of education: HS   Occupational History  . Disabled Due to multiple low back surgeries     Social History Main Topics  . Smoking status: Current Every Day Smoker    Packs/day: 0.50    Years: 36.00    Types: Cigarettes  . Smokeless tobacco: Never Used  . Alcohol use No  . Drug use: No  . Sexual activity: Not on file   Other Topics Concern  . Not on file   Social History Narrative   Lives at home with her spouse, [name].   Right-handed.   1 cup coffee and  3-4 sodas per day.     PHYSICAL EXAM   Vitals:   04/18/17 0812  BP: 109/67  Pulse: 64  Weight: 194 lb 4 oz (88.1 kg)  Height: 5\' 5"  (1.651 m)    Not recorded      Body mass index is 32.32 kg/m.  PHYSICAL EXAMNIATION:  Gen: NAD, conversant, well nourised, obese, well groomed                     Cardiovascular: Regular rate rhythm, no peripheral edema, warm, nontender. Eyes: Conjunctivae clear without exudates or hemorrhage Neck: Supple, no carotid bruits. Pulmonary: Clear to auscultation bilaterally   NEUROLOGICAL EXAM:  MENTAL STATUS: Speech:    Speech is normal; fluent and spontaneous with normal comprehension.  Cognition:      Orientation to time, place and person     Normal recent and remote memory     Normal Attention span and concentration     Normal Language, naming, repeating,spontaneous speech     Fund of knowledge   CRANIAL NERVES: CN II: Visual fields are full to confrontation. Fundoscopic exam is normal with sharp discs and no vascular changes. Pupils are round equal and briskly reactive to light. CN III, IV, VI: extraocular movement are normal. No ptosis. CN V: Facial sensation is intact to pinprick in all 3 divisions bilaterally. Corneal responses are intact.  CN VII: Face is symmetric with normal eye closure and smile. CN VIII: Hearing is normal to rubbing fingers CN IX, X: Palate elevates symmetrically. Phonation is normal. CN XI: Head turning and shoulder shrug are intact CN XII: Tongue is midline with normal movements and no atrophy.  MOTOR: Variable effort on examination due to right arm wrist pain   REFLEXES: Reflexes are 2+ and symmetric at the biceps, triceps, knees, and ankles. Plantar responses are flexor.  SENSORY: Intact to light touch, pinprick, positional sensation and vibratory sensation are intact in fingers and toes.  COORDINATION: Rapid alternating movements and fine finger movements are intact. There is no dysmetria on finger-to-nose and heel-knee-shin.    GAIT/STANCE: Posture is normal. Gait is steady with normal steps, base, arm swing, and turning. Heel and toe walking are normal. Tandem gait is normal.  Romberg is absent.   DIAGNOSTIC DATA (LABS, IMAGING, TESTING) - I reviewed patient records, labs, notes, testing and imaging myself where available.   ASSESSMENT AND PLAN  JENNINE PEDDY is a 56 y.o. female   Right hand arm paresthesia pain, subjective weakness  EMG nerve conduction study to rule out right upper extremity neuropathy  Probable complex partial seizure  EEG  Keep current medications of lamotrigine 100 mg twice a day, Vimpat 200 mg twice a day  Marcial Pacas, M.D. Ph.D.  [facility] [address], [place], [place] [address] Ph: [phone] Fax: [phone]  CC: Park Liter, MD, Raina Mina., MD

## 2017-04-19 ENCOUNTER — Other Ambulatory Visit: Payer: Self-pay | Admitting: Cardiology

## 2017-04-20 ENCOUNTER — Ambulatory Visit (INDEPENDENT_AMBULATORY_CARE_PROVIDER_SITE_OTHER): Payer: Medicare Other | Admitting: Neurology

## 2017-04-20 DIAGNOSIS — R531 Weakness: Secondary | ICD-10-CM

## 2017-04-20 DIAGNOSIS — M79601 Pain in right arm: Secondary | ICD-10-CM

## 2017-04-20 DIAGNOSIS — G40909 Epilepsy, unspecified, not intractable, without status epilepticus: Secondary | ICD-10-CM

## 2017-04-22 DIAGNOSIS — J449 Chronic obstructive pulmonary disease, unspecified: Secondary | ICD-10-CM | POA: Diagnosis not present

## 2017-04-22 DIAGNOSIS — G4733 Obstructive sleep apnea (adult) (pediatric): Secondary | ICD-10-CM | POA: Diagnosis not present

## 2017-04-22 DIAGNOSIS — R2689 Other abnormalities of gait and mobility: Secondary | ICD-10-CM | POA: Diagnosis not present

## 2017-04-22 DIAGNOSIS — M5136 Other intervertebral disc degeneration, lumbar region: Secondary | ICD-10-CM | POA: Diagnosis not present

## 2017-04-22 DIAGNOSIS — E119 Type 2 diabetes mellitus without complications: Secondary | ICD-10-CM | POA: Diagnosis not present

## 2017-04-22 DIAGNOSIS — G40211 Localization-related (focal) (partial) symptomatic epilepsy and epileptic syndromes with complex partial seizures, intractable, with status epilepticus: Secondary | ICD-10-CM | POA: Diagnosis not present

## 2017-04-22 NOTE — Procedures (Signed)
   HISTORY: 56 years old female with history of seizure.  TECHNIQUE:  16 channel EEG was performed based on standard 10-16 international system. One channel was dedicated to EKG, which has demonstrates normal sinus rhythm of 66 beats per minutes.  Upon awakening, the posterior background activity was well-developed, in alpha range 8Hz , with amplitude of microvoltage, reactive to eye opening and closure.  There was no evidence of epileptiform discharge.  Photic stimulation was performed, which induced a symmetric photic driving.  Hyperventilation was not performed.  No sleep was achieved.  CONCLUSION: This is a  normal awake EEG.  There is no electrodiagnostic evidence of epileptiform discharge.  Marcial Pacas, M.D. Ph.D.  [facility] [place], [place] [address] Phone: [phone] Fax:      [phone]

## 2017-04-25 DIAGNOSIS — J449 Chronic obstructive pulmonary disease, unspecified: Secondary | ICD-10-CM | POA: Diagnosis not present

## 2017-04-25 DIAGNOSIS — G40211 Localization-related (focal) (partial) symptomatic epilepsy and epileptic syndromes with complex partial seizures, intractable, with status epilepticus: Secondary | ICD-10-CM | POA: Diagnosis not present

## 2017-04-25 DIAGNOSIS — E119 Type 2 diabetes mellitus without complications: Secondary | ICD-10-CM | POA: Diagnosis not present

## 2017-04-25 DIAGNOSIS — G4733 Obstructive sleep apnea (adult) (pediatric): Secondary | ICD-10-CM | POA: Diagnosis not present

## 2017-04-25 DIAGNOSIS — M5136 Other intervertebral disc degeneration, lumbar region: Secondary | ICD-10-CM | POA: Diagnosis not present

## 2017-04-25 DIAGNOSIS — R2689 Other abnormalities of gait and mobility: Secondary | ICD-10-CM | POA: Diagnosis not present

## 2017-04-26 ENCOUNTER — Other Ambulatory Visit: Payer: Self-pay | Admitting: Cardiology

## 2017-04-26 DIAGNOSIS — Z79891 Long term (current) use of opiate analgesic: Secondary | ICD-10-CM | POA: Diagnosis not present

## 2017-04-26 DIAGNOSIS — M4326 Fusion of spine, lumbar region: Secondary | ICD-10-CM | POA: Diagnosis not present

## 2017-04-26 DIAGNOSIS — M5106 Intervertebral disc disorders with myelopathy, lumbar region: Secondary | ICD-10-CM | POA: Diagnosis not present

## 2017-04-26 DIAGNOSIS — Z79899 Other long term (current) drug therapy: Secondary | ICD-10-CM | POA: Diagnosis not present

## 2017-04-26 DIAGNOSIS — M791 Myalgia: Secondary | ICD-10-CM | POA: Diagnosis not present

## 2017-04-26 DIAGNOSIS — G894 Chronic pain syndrome: Secondary | ICD-10-CM | POA: Diagnosis not present

## 2017-04-27 ENCOUNTER — Telehealth: Payer: Self-pay | Admitting: Neurology

## 2017-04-27 DIAGNOSIS — H5712 Ocular pain, left eye: Secondary | ICD-10-CM | POA: Diagnosis not present

## 2017-04-27 NOTE — Telephone Encounter (Signed)
Patient is aware of EEG results.

## 2017-04-27 NOTE — Telephone Encounter (Signed)
Patient's spouse calling to get EEG results.

## 2017-04-29 DIAGNOSIS — R2689 Other abnormalities of gait and mobility: Secondary | ICD-10-CM | POA: Diagnosis not present

## 2017-04-29 DIAGNOSIS — E119 Type 2 diabetes mellitus without complications: Secondary | ICD-10-CM | POA: Diagnosis not present

## 2017-04-29 DIAGNOSIS — G40211 Localization-related (focal) (partial) symptomatic epilepsy and epileptic syndromes with complex partial seizures, intractable, with status epilepticus: Secondary | ICD-10-CM | POA: Diagnosis not present

## 2017-04-29 DIAGNOSIS — M5136 Other intervertebral disc degeneration, lumbar region: Secondary | ICD-10-CM | POA: Diagnosis not present

## 2017-04-29 DIAGNOSIS — G4733 Obstructive sleep apnea (adult) (pediatric): Secondary | ICD-10-CM | POA: Diagnosis not present

## 2017-04-29 DIAGNOSIS — J449 Chronic obstructive pulmonary disease, unspecified: Secondary | ICD-10-CM | POA: Diagnosis not present

## 2017-05-03 DIAGNOSIS — E119 Type 2 diabetes mellitus without complications: Secondary | ICD-10-CM | POA: Diagnosis not present

## 2017-05-03 DIAGNOSIS — G4733 Obstructive sleep apnea (adult) (pediatric): Secondary | ICD-10-CM | POA: Diagnosis not present

## 2017-05-03 DIAGNOSIS — G40211 Localization-related (focal) (partial) symptomatic epilepsy and epileptic syndromes with complex partial seizures, intractable, with status epilepticus: Secondary | ICD-10-CM | POA: Diagnosis not present

## 2017-05-03 DIAGNOSIS — R2689 Other abnormalities of gait and mobility: Secondary | ICD-10-CM | POA: Diagnosis not present

## 2017-05-03 DIAGNOSIS — J449 Chronic obstructive pulmonary disease, unspecified: Secondary | ICD-10-CM | POA: Diagnosis not present

## 2017-05-03 DIAGNOSIS — M5136 Other intervertebral disc degeneration, lumbar region: Secondary | ICD-10-CM | POA: Diagnosis not present

## 2017-05-04 DIAGNOSIS — J4521 Mild intermittent asthma with (acute) exacerbation: Secondary | ICD-10-CM | POA: Diagnosis not present

## 2017-05-04 DIAGNOSIS — F411 Generalized anxiety disorder: Secondary | ICD-10-CM | POA: Diagnosis not present

## 2017-05-04 DIAGNOSIS — R05 Cough: Secondary | ICD-10-CM | POA: Diagnosis not present

## 2017-05-05 DIAGNOSIS — D839 Common variable immunodeficiency, unspecified: Secondary | ICD-10-CM | POA: Diagnosis not present

## 2017-05-06 ENCOUNTER — Telehealth: Payer: Self-pay | Admitting: Neurology

## 2017-05-06 NOTE — Telephone Encounter (Signed)
CT head without and with contrast dated April 13 2017 from [facility] radiology, no acute intracranial abnormality, no mass or inflammatory process of the included orbits,

## 2017-05-10 DIAGNOSIS — M5136 Other intervertebral disc degeneration, lumbar region: Secondary | ICD-10-CM | POA: Diagnosis not present

## 2017-05-10 DIAGNOSIS — R2689 Other abnormalities of gait and mobility: Secondary | ICD-10-CM | POA: Diagnosis not present

## 2017-05-10 DIAGNOSIS — E119 Type 2 diabetes mellitus without complications: Secondary | ICD-10-CM | POA: Diagnosis not present

## 2017-05-10 DIAGNOSIS — G4733 Obstructive sleep apnea (adult) (pediatric): Secondary | ICD-10-CM | POA: Diagnosis not present

## 2017-05-10 DIAGNOSIS — J449 Chronic obstructive pulmonary disease, unspecified: Secondary | ICD-10-CM | POA: Diagnosis not present

## 2017-05-10 DIAGNOSIS — G40211 Localization-related (focal) (partial) symptomatic epilepsy and epileptic syndromes with complex partial seizures, intractable, with status epilepticus: Secondary | ICD-10-CM | POA: Diagnosis not present

## 2017-05-11 ENCOUNTER — Telehealth: Payer: Self-pay | Admitting: *Deleted

## 2017-05-11 ENCOUNTER — Encounter: Payer: Medicare Other | Admitting: Neurology

## 2017-05-11 NOTE — Telephone Encounter (Signed)
No show - called to cancel noon NCV/EMG at 9:45am - stated to phone room she had too much going on.

## 2017-05-12 ENCOUNTER — Encounter: Payer: Self-pay | Admitting: Neurology

## 2017-05-16 ENCOUNTER — Encounter: Payer: Medicare Other | Admitting: Surgery

## 2017-05-16 ENCOUNTER — Other Ambulatory Visit (HOSPITAL_COMMUNITY): Payer: Medicare Other

## 2017-05-16 DIAGNOSIS — G40211 Localization-related (focal) (partial) symptomatic epilepsy and epileptic syndromes with complex partial seizures, intractable, with status epilepticus: Secondary | ICD-10-CM | POA: Diagnosis not present

## 2017-05-16 DIAGNOSIS — M5136 Other intervertebral disc degeneration, lumbar region: Secondary | ICD-10-CM | POA: Diagnosis not present

## 2017-05-16 DIAGNOSIS — G4733 Obstructive sleep apnea (adult) (pediatric): Secondary | ICD-10-CM | POA: Diagnosis not present

## 2017-05-16 DIAGNOSIS — J449 Chronic obstructive pulmonary disease, unspecified: Secondary | ICD-10-CM | POA: Diagnosis not present

## 2017-05-16 DIAGNOSIS — R2689 Other abnormalities of gait and mobility: Secondary | ICD-10-CM | POA: Diagnosis not present

## 2017-05-16 DIAGNOSIS — E119 Type 2 diabetes mellitus without complications: Secondary | ICD-10-CM | POA: Diagnosis not present

## 2017-05-19 DIAGNOSIS — E119 Type 2 diabetes mellitus without complications: Secondary | ICD-10-CM | POA: Diagnosis not present

## 2017-05-19 DIAGNOSIS — J449 Chronic obstructive pulmonary disease, unspecified: Secondary | ICD-10-CM | POA: Diagnosis not present

## 2017-05-19 DIAGNOSIS — R2689 Other abnormalities of gait and mobility: Secondary | ICD-10-CM | POA: Diagnosis not present

## 2017-05-19 DIAGNOSIS — M5136 Other intervertebral disc degeneration, lumbar region: Secondary | ICD-10-CM | POA: Diagnosis not present

## 2017-05-19 DIAGNOSIS — G40211 Localization-related (focal) (partial) symptomatic epilepsy and epileptic syndromes with complex partial seizures, intractable, with status epilepticus: Secondary | ICD-10-CM | POA: Diagnosis not present

## 2017-05-19 DIAGNOSIS — G4733 Obstructive sleep apnea (adult) (pediatric): Secondary | ICD-10-CM | POA: Diagnosis not present

## 2017-05-26 DIAGNOSIS — G894 Chronic pain syndrome: Secondary | ICD-10-CM | POA: Diagnosis not present

## 2017-05-26 DIAGNOSIS — M4326 Fusion of spine, lumbar region: Secondary | ICD-10-CM | POA: Diagnosis not present

## 2017-06-10 DIAGNOSIS — D839 Common variable immunodeficiency, unspecified: Secondary | ICD-10-CM | POA: Diagnosis not present

## 2017-06-16 DIAGNOSIS — G40209 Localization-related (focal) (partial) symptomatic epilepsy and epileptic syndromes with complex partial seizures, not intractable, without status epilepticus: Secondary | ICD-10-CM | POA: Diagnosis not present

## 2017-06-16 DIAGNOSIS — J441 Chronic obstructive pulmonary disease with (acute) exacerbation: Secondary | ICD-10-CM | POA: Diagnosis not present

## 2017-06-16 DIAGNOSIS — Z794 Long term (current) use of insulin: Secondary | ICD-10-CM | POA: Diagnosis not present

## 2017-06-16 DIAGNOSIS — R509 Fever, unspecified: Secondary | ICD-10-CM | POA: Diagnosis not present

## 2017-06-16 DIAGNOSIS — E119 Type 2 diabetes mellitus without complications: Secondary | ICD-10-CM | POA: Diagnosis not present

## 2017-06-16 DIAGNOSIS — F17210 Nicotine dependence, cigarettes, uncomplicated: Secondary | ICD-10-CM | POA: Diagnosis not present

## 2017-06-20 DIAGNOSIS — Z794 Long term (current) use of insulin: Secondary | ICD-10-CM | POA: Diagnosis not present

## 2017-06-20 DIAGNOSIS — J432 Centrilobular emphysema: Secondary | ICD-10-CM | POA: Diagnosis not present

## 2017-06-20 DIAGNOSIS — E119 Type 2 diabetes mellitus without complications: Secondary | ICD-10-CM | POA: Diagnosis not present

## 2017-06-20 DIAGNOSIS — I10 Essential (primary) hypertension: Secondary | ICD-10-CM | POA: Diagnosis not present

## 2017-06-20 DIAGNOSIS — F17200 Nicotine dependence, unspecified, uncomplicated: Secondary | ICD-10-CM | POA: Diagnosis not present

## 2017-06-20 DIAGNOSIS — J40 Bronchitis, not specified as acute or chronic: Secondary | ICD-10-CM | POA: Diagnosis not present

## 2017-06-27 DIAGNOSIS — R05 Cough: Secondary | ICD-10-CM | POA: Diagnosis not present

## 2017-06-27 DIAGNOSIS — G894 Chronic pain syndrome: Secondary | ICD-10-CM | POA: Diagnosis not present

## 2017-06-27 DIAGNOSIS — M545 Low back pain: Secondary | ICD-10-CM | POA: Diagnosis not present

## 2017-06-27 DIAGNOSIS — M5106 Intervertebral disc disorders with myelopathy, lumbar region: Secondary | ICD-10-CM | POA: Diagnosis not present

## 2017-06-27 DIAGNOSIS — J4520 Mild intermittent asthma, uncomplicated: Secondary | ICD-10-CM | POA: Diagnosis not present

## 2017-06-27 DIAGNOSIS — R5383 Other fatigue: Secondary | ICD-10-CM | POA: Diagnosis not present

## 2017-06-29 DIAGNOSIS — F411 Generalized anxiety disorder: Secondary | ICD-10-CM | POA: Diagnosis not present

## 2017-06-29 DIAGNOSIS — J4521 Mild intermittent asthma with (acute) exacerbation: Secondary | ICD-10-CM | POA: Diagnosis not present

## 2017-06-30 ENCOUNTER — Telehealth: Payer: Self-pay | Admitting: *Deleted

## 2017-06-30 DIAGNOSIS — J4521 Mild intermittent asthma with (acute) exacerbation: Secondary | ICD-10-CM | POA: Diagnosis not present

## 2017-06-30 DIAGNOSIS — R0600 Dyspnea, unspecified: Secondary | ICD-10-CM | POA: Diagnosis not present

## 2017-06-30 NOTE — Telephone Encounter (Signed)
Patient spouse called and advised that [name] has been sick for 3 weeks and treated for bronchitis with 2 different antibiotics and steroids but not really improving.  Was wanting to know about getting her IVIG done as scheduled 11/7.  I told her that she needed that antibodies to help with her infection but will send message to Dr Nelva Bush to see if she might want to check her antibody level prior to infusion next week.  Her last was in 800s in March of this year.  Also she advised he xray was normal recently

## 2017-06-30 NOTE — Telephone Encounter (Signed)
Will fax order to [facility] today and advise patient

## 2017-06-30 NOTE — Telephone Encounter (Signed)
Yes please check a IgG trough prior to next infusion.   She was supposed to have IgG done over the summer but never got any results back.  Yes she should stay on schedule with IVIG.  Glad to hear the CXR was normal.

## 2017-07-04 ENCOUNTER — Ambulatory Visit: Payer: Medicare Other | Admitting: Neurology

## 2017-07-04 DIAGNOSIS — J4521 Mild intermittent asthma with (acute) exacerbation: Secondary | ICD-10-CM | POA: Diagnosis not present

## 2017-07-04 DIAGNOSIS — G2581 Restless legs syndrome: Secondary | ICD-10-CM | POA: Diagnosis not present

## 2017-07-04 DIAGNOSIS — F411 Generalized anxiety disorder: Secondary | ICD-10-CM | POA: Diagnosis not present

## 2017-07-06 DIAGNOSIS — D839 Common variable immunodeficiency, unspecified: Secondary | ICD-10-CM | POA: Diagnosis not present

## 2017-07-13 DIAGNOSIS — J4521 Mild intermittent asthma with (acute) exacerbation: Secondary | ICD-10-CM | POA: Diagnosis not present

## 2017-07-13 DIAGNOSIS — Z9981 Dependence on supplemental oxygen: Secondary | ICD-10-CM | POA: Diagnosis not present

## 2017-07-13 DIAGNOSIS — Z79899 Other long term (current) drug therapy: Secondary | ICD-10-CM | POA: Diagnosis not present

## 2017-07-13 DIAGNOSIS — J310 Chronic rhinitis: Secondary | ICD-10-CM | POA: Diagnosis not present

## 2017-07-13 DIAGNOSIS — J9621 Acute and chronic respiratory failure with hypoxia: Secondary | ICD-10-CM | POA: Diagnosis not present

## 2017-07-13 DIAGNOSIS — Z7952 Long term (current) use of systemic steroids: Secondary | ICD-10-CM | POA: Diagnosis not present

## 2017-07-13 DIAGNOSIS — G2581 Restless legs syndrome: Secondary | ICD-10-CM | POA: Diagnosis not present

## 2017-07-13 DIAGNOSIS — J441 Chronic obstructive pulmonary disease with (acute) exacerbation: Secondary | ICD-10-CM | POA: Diagnosis not present

## 2017-07-13 DIAGNOSIS — Z7982 Long term (current) use of aspirin: Secondary | ICD-10-CM | POA: Diagnosis not present

## 2017-07-13 DIAGNOSIS — Z87891 Personal history of nicotine dependence: Secondary | ICD-10-CM | POA: Diagnosis not present

## 2017-07-13 DIAGNOSIS — R0602 Shortness of breath: Secondary | ICD-10-CM | POA: Diagnosis not present

## 2017-07-13 DIAGNOSIS — J9690 Respiratory failure, unspecified, unspecified whether with hypoxia or hypercapnia: Secondary | ICD-10-CM | POA: Diagnosis not present

## 2017-07-13 DIAGNOSIS — Z23 Encounter for immunization: Secondary | ICD-10-CM | POA: Diagnosis not present

## 2017-07-14 DIAGNOSIS — R0602 Shortness of breath: Secondary | ICD-10-CM

## 2017-07-20 ENCOUNTER — Encounter: Payer: Self-pay | Admitting: Allergy and Immunology

## 2017-07-25 DIAGNOSIS — D849 Immunodeficiency, unspecified: Secondary | ICD-10-CM | POA: Diagnosis not present

## 2017-07-25 DIAGNOSIS — J4530 Mild persistent asthma, uncomplicated: Secondary | ICD-10-CM | POA: Diagnosis not present

## 2017-07-25 DIAGNOSIS — G2581 Restless legs syndrome: Secondary | ICD-10-CM | POA: Diagnosis not present

## 2017-07-27 DIAGNOSIS — G894 Chronic pain syndrome: Secondary | ICD-10-CM | POA: Diagnosis not present

## 2017-07-27 DIAGNOSIS — I10 Essential (primary) hypertension: Secondary | ICD-10-CM | POA: Diagnosis not present

## 2017-07-27 DIAGNOSIS — Z6828 Body mass index (BMI) 28.0-28.9, adult: Secondary | ICD-10-CM | POA: Diagnosis not present

## 2017-07-29 DIAGNOSIS — E119 Type 2 diabetes mellitus without complications: Secondary | ICD-10-CM | POA: Diagnosis not present

## 2017-07-29 DIAGNOSIS — J432 Centrilobular emphysema: Secondary | ICD-10-CM | POA: Diagnosis not present

## 2017-07-29 DIAGNOSIS — I10 Essential (primary) hypertension: Secondary | ICD-10-CM | POA: Diagnosis not present

## 2017-07-29 DIAGNOSIS — F17210 Nicotine dependence, cigarettes, uncomplicated: Secondary | ICD-10-CM | POA: Diagnosis not present

## 2017-07-29 DIAGNOSIS — Z794 Long term (current) use of insulin: Secondary | ICD-10-CM | POA: Diagnosis not present

## 2017-07-29 DIAGNOSIS — G4733 Obstructive sleep apnea (adult) (pediatric): Secondary | ICD-10-CM | POA: Diagnosis not present

## 2017-07-29 DIAGNOSIS — G40209 Localization-related (focal) (partial) symptomatic epilepsy and epileptic syndromes with complex partial seizures, not intractable, without status epilepticus: Secondary | ICD-10-CM | POA: Diagnosis not present

## 2017-07-29 DIAGNOSIS — M5136 Other intervertebral disc degeneration, lumbar region: Secondary | ICD-10-CM | POA: Diagnosis not present

## 2017-07-29 DIAGNOSIS — D839 Common variable immunodeficiency, unspecified: Secondary | ICD-10-CM | POA: Diagnosis not present

## 2017-07-29 DIAGNOSIS — K219 Gastro-esophageal reflux disease without esophagitis: Secondary | ICD-10-CM | POA: Diagnosis not present

## 2017-07-29 DIAGNOSIS — I25118 Atherosclerotic heart disease of native coronary artery with other forms of angina pectoris: Secondary | ICD-10-CM | POA: Diagnosis not present

## 2017-08-03 ENCOUNTER — Telehealth: Payer: Self-pay | Admitting: Allergy

## 2017-08-03 ENCOUNTER — Telehealth: Payer: Self-pay | Admitting: *Deleted

## 2017-08-03 DIAGNOSIS — D839 Common variable immunodeficiency, unspecified: Secondary | ICD-10-CM | POA: Diagnosis not present

## 2017-08-03 NOTE — Telephone Encounter (Signed)
Called and advised patient partner of info and discussed her options regarding starting SCIG will go ahead and start to change over once we get her IgG numbers prior to her scheduled infusion on 09/02/17

## 2017-08-03 NOTE — Telephone Encounter (Signed)
Made in error

## 2017-08-03 NOTE — Telephone Encounter (Signed)
Yes any should be good for her.  I went ahead and faxed order to check IgG prior to her infusion at her visit on 09/02/17

## 2017-08-03 NOTE — Telephone Encounter (Signed)
Perfect!!   Would all the scig options be covered for her?

## 2017-08-03 NOTE — Telephone Encounter (Signed)
-----   Message from Arley, MD sent at 08/03/2017  1:22 PM EST -----  Please let them know that port is not an option.   Any permanent or semi-permanent line increases risk of infections greatly thus port is not a good option.   If she is having access issues she would be better suited with switching to the subq options which I have mentioned to them before however at the time previously they were happy with the infusions and did not want to change.  I would need to ensure that her new increased dose of 40gm is a good dose to keep up IgG >800 before changing to a subq option.  I hope she was able to get a trough IgG since the increase if she hasn't we can have done at next infusion.   But this might be the best time to switch to a Hizentra/Hyquvia/Cuvitru option.    ----- Message ----- From: Jettie Pagan Sent: 08/03/2017  11:54 AM To: Kennith Gain, MD  Dr. Mamie Nick, So, Pam, Recia's significant other, called and said she wanted me to send you a message to ask you if there was any way you could put an order in to have [facility] ported.  Pam said she is at the hospital today receiving her IVIG  And they keep having trouble getting a vein and her veins keep "blowing out" and she thinks it would be easier on [place] if she could have a port put in.  I didn't even want to put this in as a telephone contact.  Pam would like someone to call her as soon as they can her number is [phone].  Of course I got the whole "I need this done today as soon as possible please" but I told her I wasn't sure if that was a call you could make.  Thanks, Elmyra Ricks

## 2017-08-04 DIAGNOSIS — M545 Low back pain: Secondary | ICD-10-CM | POA: Diagnosis not present

## 2017-08-04 DIAGNOSIS — G894 Chronic pain syndrome: Secondary | ICD-10-CM | POA: Diagnosis not present

## 2017-08-15 DIAGNOSIS — R490 Dysphonia: Secondary | ICD-10-CM | POA: Diagnosis not present

## 2017-08-15 DIAGNOSIS — K2970 Gastritis, unspecified, without bleeding: Secondary | ICD-10-CM | POA: Diagnosis not present

## 2017-08-15 DIAGNOSIS — K219 Gastro-esophageal reflux disease without esophagitis: Secondary | ICD-10-CM | POA: Diagnosis not present

## 2017-08-15 DIAGNOSIS — J342 Deviated nasal septum: Secondary | ICD-10-CM | POA: Diagnosis not present

## 2017-08-15 DIAGNOSIS — E041 Nontoxic single thyroid nodule: Secondary | ICD-10-CM | POA: Diagnosis not present

## 2017-08-15 DIAGNOSIS — F17200 Nicotine dependence, unspecified, uncomplicated: Secondary | ICD-10-CM | POA: Diagnosis not present

## 2017-08-15 DIAGNOSIS — J312 Chronic pharyngitis: Secondary | ICD-10-CM | POA: Diagnosis not present

## 2017-08-15 DIAGNOSIS — J3489 Other specified disorders of nose and nasal sinuses: Secondary | ICD-10-CM | POA: Diagnosis not present

## 2017-08-15 DIAGNOSIS — R1310 Dysphagia, unspecified: Secondary | ICD-10-CM | POA: Diagnosis not present

## 2017-08-16 DIAGNOSIS — M4326 Fusion of spine, lumbar region: Secondary | ICD-10-CM | POA: Diagnosis not present

## 2017-08-16 DIAGNOSIS — G894 Chronic pain syndrome: Secondary | ICD-10-CM | POA: Diagnosis not present

## 2017-09-20 ENCOUNTER — Ambulatory Visit (INDEPENDENT_AMBULATORY_CARE_PROVIDER_SITE_OTHER): Payer: Medicare Other | Admitting: Allergy

## 2017-09-20 ENCOUNTER — Encounter: Payer: Self-pay | Admitting: Allergy

## 2017-09-20 VITALS — BP 140/76 | HR 72 | Resp 16

## 2017-09-20 DIAGNOSIS — D839 Common variable immunodeficiency, unspecified: Secondary | ICD-10-CM | POA: Diagnosis not present

## 2017-09-20 NOTE — Patient Instructions (Signed)
Common Variable Immunodeficiency (CVID)    - doing well on month IVIG infusions without any infections!      - we will recheck your IgG level prior to your infusion in September to ensure it is in a good range     - advise to maintain good hydration status especially prior to your infusions.       - you will most likely need infusions life-long.       - discussed alternative options for IgG replacement including subcutaneous infusions.  Let us know if you ever want to switch over to this option in the future.  Can visit  [email or web address]  to learn more about doing at-home infusions of IgG    Follow-up in 6 months or sooner if needed

## 2017-09-20 NOTE — Progress Notes (Signed)
Follow-up Note  RE: [name] MRN: [id number] DOB: [birth year] Date of Office Visit: 09/20/2017   History of present illness: Kathy Moss is a 57 y.o. female presenting today for follow-up of CVID.  She presents today with her partner.  She was last seen in the office on March 22, 2017 by myself.  Since this visit she did develop pneumonia and was hospitalized requiring IV antibiotics around November 2018.  This was her first major infection since starting on IVIG.  We have been following her trough IgG levels and she did have a lower than what I would like IgG of 652 in November and thus her IgG dose was increased.  Her most recent trough level on the increased dose was 629.  She denies any issues with diarrhea or any swelling or weight gain.  She also is not having any worsening of her COPD and emphysema then previous.  She does report that she is still very fatigued.  She otherwise has not had any significant health changes or surgery since her last visit.  Review of systems: Review of Systems  Constitutional: Positive for malaise/fatigue. Negative for chills, diaphoresis, fever and weight loss.  HENT: Negative for congestion, ear discharge, nosebleeds and sore throat.   Eyes: Negative for pain, discharge and redness.  Respiratory: Positive for cough and shortness of breath.   Cardiovascular: Negative for chest pain.  Gastrointestinal: Negative for abdominal pain, constipation, diarrhea, nausea and vomiting.  Musculoskeletal: Positive for back pain and joint pain.  Skin: Negative for itching and rash.  Neurological: Negative for headaches.    All other systems negative unless noted above in HPI  Past medical/social/surgical/family history have been reviewed and are unchanged unless specifically indicated below.  No changes  Medication List: Allergies as of 09/20/2017      Reactions   Carimune [immune Globulin] Anaphylaxis   Imitrex [sumatriptan] Palpitations   Morphine  Itching   Tramadol Palpitations   Adhesive [tape] Other (See Comments)   TAKES SKIN OFF (paper/silk tape) MEDICAL TAPE CAUSES BRUISES AND TEARS SKIN,  PAPER TAPE IS OK   Amitriptyline Palpitations   Buprenorphine Hcl Itching   Morphine And Related Itching      Medication List        Accurate as of 09/20/17  5:05 PM. Always use your most recent med list.          albuterol 108 (90 Base) MCG/ACT inhaler Commonly known as:  PROVENTIL HFA;VENTOLIN HFA Inhale 1-2 puffs into the lungs every 6 (six) hours as needed for wheezing or shortness of breath.   ALPRAZolam 0.5 MG tablet Commonly known as:  XANAX Take 0.5 mg by mouth every 8 (eight) hours as needed for anxiety.   aspirin EC 81 MG tablet Take 81 mg by mouth daily.   azelastine 0.1 % nasal spray Commonly known as:  ASTELIN Place 2 sprays into both nostrils 2 (two) times daily. Use in each nostril as directed   BREO ELLIPTA 200-25 MCG/INH Aepb Generic drug:  fluticasone furoate-vilanterol Inhale 1 puff into the lungs daily.   butalbital-acetaminophen-caffeine 50-325-40 MG tablet Commonly known as:  FIORICET, ESGIC TAKE 1-2 TABLET(S) BY MOUTH EVERY 4 HOURS AS NEEDED FOR PAIN.   DILT-XR 120 MG 24 hr capsule Generic drug:  diltiazem TAKE 1 CAPSULE BY MOUTH ONCE DAILY   diltiazem 120 MG 24 hr tablet Commonly known as:  CARDIZEM LA Take 120 mg by mouth daily.   esomeprazole 40 MG capsule Commonly known as:  NEXIUM Take 40 mg by mouth 2 (two) times daily before a meal.   eszopiclone 3 MG Tabs Generic drug:  Eszopiclone Take 3 mg by mouth at bedtime. Take immediately before bedtime   FISH OIL PO Take 2,000 mg by mouth 2 (two) times daily.   fluticasone 50 MCG/ACT nasal spray Commonly known as:  FLONASE Place 1 spray into both nostrils daily.   gabapentin 300 MG capsule Commonly known as:  NEURONTIN Take 600 mg by mouth 3 (three) times daily.   insulin glargine 100 UNIT/ML injection Commonly known as:   LANTUS Inject into the skin at bedtime as needed (high blood sugar).   insulin lispro 100 UNIT/ML injection Commonly known as:  HUMALOG Inject 10 Units into the skin 3 (three) times daily as needed for high blood sugar (for high blood sugar greather than 150).   lacosamide 200 MG Tabs tablet Commonly known as:  VIMPAT Take 200 mg by mouth 2 (two) times daily.   lamoTRIgine 100 MG tablet Commonly known as:  LAMICTAL Take 1 tablet (100 mg total) by mouth 2 (two) times daily.   nitroGLYCERIN 0.4 MG SL tablet Commonly known as:  NITROSTAT Place 1 tablet (0.4 mg total) under the tongue every 5 (five) minutes as needed for chest pain.   nystatin 100000 UNIT/ML suspension Commonly known as:  MYCOSTATIN Take 5 mLs by mouth 3 (three) times daily as needed (FOR THRUSH).   olmesartan 20 MG tablet Commonly known as:  BENICAR Take 20 mg by mouth daily.   oxyCODONE-acetaminophen 10-325 MG tablet Commonly known as:  PERCOCET One tablet every 4 hours as needed for pain.   PRIVIGEN IV Inject 30 g into the vein every 28 (twenty-eight) days.   promethazine 25 MG tablet Commonly known as:  PHENERGAN Take 25 mg by mouth every 6 (six) hours as needed for nausea or vomiting.   ranitidine 300 MG tablet Commonly known as:  ZANTAC Take 300 mg by mouth at bedtime.   ranolazine 500 MG 12 hr tablet Commonly known as:  RANEXA Take 1 tablet (500 mg total) by mouth 2 (two) times daily.   rOPINIRole 1 MG tablet Commonly known as:  REQUIP Take 3 mg by mouth at bedtime.   rosuvastatin 5 MG tablet Commonly known as:  CRESTOR TAKE 1 TABLET BY MOUTH ONCE DAILY   tiotropium 18 MCG inhalation capsule Commonly known as:  SPIRIVA Place 18 mcg into inhaler and inhale daily.   tiZANidine 4 MG capsule Commonly known as:  ZANAFLEX Take 4 mg by mouth 3 (three) times daily.   Vitamin D (Ergocalciferol) 50000 units Caps capsule Commonly known as:  DRISDOL Take 50,000 Units by mouth every Tuesday.        Known medication allergies: Allergies  Allergen Reactions  . Carimune [Immune Globulin] Anaphylaxis  . Imitrex [Sumatriptan] Palpitations  . Morphine Itching  . Tramadol Palpitations  . Adhesive [Tape] Other (See Comments)    TAKES SKIN OFF (paper/silk tape) MEDICAL TAPE CAUSES BRUISES AND TEARS SKIN,  PAPER TAPE IS OK  . Amitriptyline Palpitations  . Buprenorphine Hcl Itching  . Morphine And Related Itching     Physical examination: Blood pressure 140/76, pulse 72, resp. rate 16.  General: Alert, interactive, in no acute distress. HEENT: PERRLA, TMs pearly gray, turbinates minimally edematous without discharge, post-pharynx non erythematous. Neck: Supple without lymphadenopathy. Lungs: Mildly decreased breath sounds bilaterally without wheezing, rhonchi or rales. {no increased work of breathing. CV: Normal S1, S2 without murmurs. Abdomen: Nondistended, nontender.  Skin: Warm and dry, without lesions or rashes. Extremities:  No clubbing, cyanosis or edema. Neuro:   Grossly intact.  Diagnositics/Labs: Labs: IgG 629 from 09/02/2017  Spirometry: FEV1: 1.82L  67%, FVC: 2.8L  81% lung function is slightly improved from previous study  Assessment and plan:   Common Variable Immunodeficiency (CVID)    -She did have pneumonia requiring hospitalization however this is her first major infection since starting on IVIG    -Her most recent trough level was 629 which is lower than her previous trough on an increased IVIG dosing.  I am not sure what her IgG is lower because I do not have a reason for her to be losing her immunoglobulin.  She is not having any diarrhea no increase in weight or swelling episodes that her breathing is stable.  We will increase her dose again to 50 g which is a little under 600 mg/kg of immunoglobulin.      -She is also been having access issues at the infusion center and I discussed with them that ports and picks or not an option as they increase infection  risk.  Thus I have discussed subcutaneous options and feel that this is the best way to proceed to decrease issues with access as well as to maintain sustained IgG levels.  We have started the approval process for HyQvia at this time.  She will get her next infusion on February 6 and will transition to subcutaneous after that at which point we will obtain IgG level to ensure that we are at a good dose.     -I have also discussed that it would be great for her to be able to get out of the home and to do activities so that she is not getting depressed or lack of energy due to lack of activity.  I did discuss that she should avoid large crowds due to risk of contracting illness however she does not need to stay in the home all the time.  They do screen visitors to make sure that they are not sick when they come to the home which I think is a great idea.  She will continue to follow good hand hygiene and methods to decrease risk of infections.    -Her spirometry is better than the previous study which is good.  If she ever has a significant decline in her lung function will consider obtaining a CT to assess for bronchiectasis and her emphysema with a history of COPD followed by pulmonary.    Follow-up in 3-4 months or sooner if needed  I appreciate the opportunity to take part in [place] care. Please do not hesitate to contact me with questions.  Sincerely,   Prudy Feeler, MD Allergy/Immunology [facility]

## 2017-09-28 ENCOUNTER — Other Ambulatory Visit: Payer: Self-pay | Admitting: Cardiology

## 2017-10-13 DIAGNOSIS — N1831 Chronic kidney disease, stage 3a: Secondary | ICD-10-CM

## 2017-10-13 HISTORY — DX: Chronic kidney disease, stage 3a: N18.31

## 2017-11-14 ENCOUNTER — Telehealth: Payer: Self-pay | Admitting: Allergy and Immunology

## 2017-11-14 NOTE — Telephone Encounter (Signed)
Faxed last office note

## 2017-11-14 NOTE — Telephone Encounter (Signed)
[facility] Calling about patient starting IV therapy today They have records from an old facility and need the most recent note and documentation from [facility] Please call if there are any questions Please fax records to [phone]

## 2017-12-01 DIAGNOSIS — G43009 Migraine without aura, not intractable, without status migrainosus: Secondary | ICD-10-CM

## 2017-12-01 HISTORY — DX: Migraine without aura, not intractable, without status migrainosus: G43.009

## 2017-12-13 ENCOUNTER — Telehealth: Payer: Self-pay | Admitting: Cardiology

## 2017-12-13 NOTE — Telephone Encounter (Signed)
Patient has been having chest pain and describes as sharp pain and being stabbed in chest. It last for a short period of time and actually had to take a nitro because it lasted more than 5 mins but Nitro helped,  She just had a another episode and it lasted 3 mins and did not take nitro  this time but it went away.. Please call pateint  And if we need to get in for appt or send to ER.

## 2017-12-13 NOTE — Telephone Encounter (Signed)
Patient's wife, [name], states that the patient is not having chest pain at this time. Advised that if she had another episode of chest pain she needed to go to the nearest emergency room to be evaluated. Dr. Agustin Cree advised to schedule patient for office visit, appointment scheduled for tomorrow 12/13/2017 at 9:40 am. [name] verbalized understanding. No further questions.

## 2017-12-14 ENCOUNTER — Ambulatory Visit (INDEPENDENT_AMBULATORY_CARE_PROVIDER_SITE_OTHER): Payer: Medicare Other | Admitting: Cardiology

## 2017-12-14 ENCOUNTER — Encounter: Payer: Self-pay | Admitting: Cardiology

## 2017-12-14 VITALS — BP 132/60 | HR 66 | Ht 65.0 in | Wt 204.1 lb

## 2017-12-14 DIAGNOSIS — F17200 Nicotine dependence, unspecified, uncomplicated: Secondary | ICD-10-CM | POA: Diagnosis not present

## 2017-12-14 DIAGNOSIS — R072 Precordial pain: Secondary | ICD-10-CM | POA: Diagnosis not present

## 2017-12-14 DIAGNOSIS — J431 Panlobular emphysema: Secondary | ICD-10-CM | POA: Diagnosis not present

## 2017-12-14 DIAGNOSIS — E7800 Pure hypercholesterolemia, unspecified: Secondary | ICD-10-CM

## 2017-12-14 MED ORDER — ISOSORBIDE MONONITRATE ER 30 MG PO TB24: 30.0000 mg | ORAL_TABLET | Freq: Every day | ORAL | 3 refills | Status: DC

## 2017-12-14 NOTE — Patient Instructions (Signed)
Medication Instructions:  Your physician has recommended you make the following change in your medication:  START isosorbide mononitrate (imdur) 30 mg daily  Labwork: None  Testing/Procedures: You had an EKG today.   Follow-Up: Your physician recommends that you schedule a follow-up appointment in: 1 month.  Any Other Special Instructions Will Be Listed Below (If Applicable).     If you need a refill on your cardiac medications before your next appointment, please call your pharmacy.

## 2017-12-14 NOTE — Progress Notes (Signed)
Cardiology Office Note:    Date:  12/14/2017   ID:  Kathy Moss, DOB 1961/01/30, MRN [id number]  PCP:  Raina Mina., MD  Cardiologist:  Jenne Campus, MD    Referring MD: Raina Mina., MD   Chief Complaint  Patient presents with  . Chest Pain    "very painful, feels like having a heartattack", pain in the left arm, feels weak afterward and gets a headache  . Shortness of Breath    Dyspnea with Exertion, x2 months no changes in the past 2 months  I have chest pain  History of Present Illness:    Kathy Moss is a 57 y.o. female with coronary artery disease cardiac catheterization done last year showed only 40% RCA lesion.  She called [place] yesterday complaining that she was having chest pain.  She had 2 episodes of chest pain but happen at rest but lasted between 1-60minutes there are located in the middle of the chest with radiation towards the left arm.  She took nitroglycerin with good relief.  Interestingly at the same time she can walk climb stairs with some shortness of breath but no otherwise symptoms.  In the fact that recent cardiac catheterization showed nonobstructive disease I will put her on long-acting nitroglycerin.  I will give her Imdur 30 mg daily also told her to try nitroglycerin on as-needed basis.  She may be experiencing esophageal spasm.  However I cannot completely rule out worsening of coronary artery disease especially with her multiple risk factors that she cannot control namely smoking.  Therefore, I still asked her to take nitroglycerin if pain happened if pain does not get relieved by nitroglycerin she need to go to the emergency room.  I will see her back in my office in about 4 weeks to see how she does.  Past Medical History:  Diagnosis Date  . Angina at rest [facility]) 2013  . Asthma   . Hypertension   . Recurrent upper respiratory infection (URI)   . Seizures ([place])   . Type 2 diabetes mellitus without complication, without long-term current use of  insulin ([place]) 06/30/2016    Past Surgical History:  Procedure Laterality Date  . APPENDECTOMY    . BACK SURGERY    . CHOLECYSTECTOMY    . HERNIA REPAIR    . LEFT HEART CATH AND CORONARY ANGIOGRAPHY N/A 03/16/2017   Procedure: Left Heart Cath and Coronary Angiography;  Surgeon: Jettie Booze, MD;  Location: [facility];  Service: Cardiovascular;  Laterality: N/A;    Current Medications: No outpatient medications have been marked as taking for the 12/14/17 encounter (Office Visit) with Park Liter, MD.     Allergies:   Carimune [immune globulin]; Imitrex [sumatriptan]; Morphine; Tramadol; Adhesive [tape]; Amitriptyline; Buprenorphine hcl; and Morphine and related   Social History   Socioeconomic History  . Marital status: Married    Spouse name: Not on file  . Number of children: 0  . Years of education: HS  . Highest education level: Not on file  Occupational History  . Occupation: Disabled  Social Needs  . Financial resource strain: Not on file  . Food insecurity:    Worry: Not on file    Inability: Not on file  . Transportation needs:    Medical: Not on file    Non-medical: Not on file  Tobacco Use  . Smoking status: Current Every Day Smoker    Packs/day: 0.50    Years: 36.00  Pack years: 18.00    Types: Cigarettes  . Smokeless tobacco: Never Used  Substance and Sexual Activity  . Alcohol use: No  . Drug use: No  . Sexual activity: Not on file  Lifestyle  . Physical activity:    Days per week: Not on file    Minutes per session: Not on file  . Stress: Not on file  Relationships  . Social connections:    Talks on phone: Not on file    Gets together: Not on file    Attends religious service: Not on file    Active member of club or organization: Not on file    Attends meetings of clubs or organizations: Not on file    Relationship status: Not on file  Other Topics Concern  . Not on file  Social History Narrative   Lives at home with  her spouse, [name].   Right-handed.   1 cup coffee and  3-4 sodas per day.     Family History: The patient's family history includes Allergic rhinitis in her brother; Asthma in her mother; COPD in her brother; Heart attack in her father; High blood pressure in her mother; Lung cancer in her mother; Seizures in her mother; Stroke in her mother. ROS:   Please see the history of present illness.    All 14 point review of systems negative except as described per history of present illness  EKGs/Labs/Other Studies Reviewed:      Recent Labs: 03/10/2017: ALT 9 03/20/2017: BUN 13; Creatinine, Ser 1.15; Hemoglobin 14.7; Platelets 228; Potassium 4.3; Sodium 136  Recent Lipid Panel No results found for: CHOL, TRIG, HDL, CHOLHDL, VLDL, LDLCALC, LDLDIRECT  Physical Exam:    VS:  BP 132/60   Pulse 66   Ht 5\' 5"  (1.651 m)   Wt 204 lb 1.9 oz (92.6 kg)   SpO2 92%   BMI 33.97 kg/m     Wt Readings from Last 3 Encounters:  12/14/17 204 lb 1.9 oz (92.6 kg)  04/18/17 194 lb 4 oz (88.1 kg)  03/30/17 202 lb (91.6 kg)     GEN:  Well nourished, well developed in no acute distress HEENT: Normal NECK: No JVD; No carotid bruits LYMPHATICS: No lymphadenopathy CARDIAC: RRR, no murmurs, no rubs, no gallops RESPIRATORY:  Clear to auscultation without rales, wheezing or rhonchi  ABDOMEN: Soft, non-tender, non-distended MUSCULOSKELETAL:  No edema; No deformity  SKIN: Warm and dry LOWER EXTREMITIES: no swelling NEUROLOGIC:  Alert and oriented x 3 PSYCHIATRIC:  Normal affect   ASSESSMENT:    1. Precordial pain   2. Pure hypercholesterolemia   3. Smoking   4. Panlobular emphysema ([place])    PLAN:    In order of problems listed above:  1. Precordial pain.  Relieved by nitroglycerin not related to exercise.  Atypical chest pain.  Plan will be to put her on long-acting nitrates and follow her up.  She is on aspirin already and statin which I will continue 2. Dyslipidemia: On statin which I will  continue 3. Smoking again she was told to quit she told him repeatedly that she will not be able to quit.  I told her the benefits of potentially quitting smoking she understood. 4. Emphysema obviously related to smoking would be beneficial to quit but discussion as above 5. Type 2 diabetes apparently somewhat difficult to control lately but since she was taking steroids for her COPD   Medication Adjustments/Labs and Tests Ordered: Current medicines are reviewed at length with the  patient today.  Concerns regarding medicines are outlined above.  No orders of the defined types were placed in this encounter.  Medication changes: No orders of the defined types were placed in this encounter.   Signed, Park Liter, MD, [facility] 12/14/2017 10:09 AM    [place]

## 2017-12-27 ENCOUNTER — Encounter: Payer: Self-pay | Admitting: Allergy

## 2017-12-27 ENCOUNTER — Ambulatory Visit (INDEPENDENT_AMBULATORY_CARE_PROVIDER_SITE_OTHER): Payer: Medicare Other | Admitting: Allergy

## 2017-12-27 VITALS — BP 92/60 | HR 76 | Resp 18

## 2017-12-27 DIAGNOSIS — D839 Common variable immunodeficiency, unspecified: Secondary | ICD-10-CM

## 2017-12-27 DIAGNOSIS — J309 Allergic rhinitis, unspecified: Secondary | ICD-10-CM

## 2017-12-27 DIAGNOSIS — J449 Chronic obstructive pulmonary disease, unspecified: Secondary | ICD-10-CM | POA: Diagnosis not present

## 2017-12-27 DIAGNOSIS — H1010 Acute atopic conjunctivitis, unspecified eye: Secondary | ICD-10-CM

## 2017-12-27 MED ORDER — MONTELUKAST SODIUM 10 MG PO TABS: ORAL_TABLET | ORAL | 5 refills | Status: DC

## 2017-12-27 MED ORDER — CETIRIZINE HCL 10 MG PO TABS: ORAL_TABLET | ORAL | 5 refills | Status: DC

## 2017-12-27 MED ORDER — PAZEO 0.7 % OP SOLN: 1.0000 [drp] | Freq: Every day | OPHTHALMIC | 5 refills | Status: DC | PRN

## 2017-12-27 MED ORDER — IPRATROPIUM BROMIDE 0.06 % NA SOLN: NASAL | 5 refills | Status: DC

## 2017-12-27 NOTE — Progress Notes (Signed)
Follow-up Note  RE: [name] MRN: [id number] DOB: 25-Jul-1961 Date of Office Visit: 12/27/2017   History of present illness: Kathy Moss is a 57 y.o. 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  She also has history of COPD with asthma followed by local pulmonary physician as well as allergic rhinitis.  She was last seen in the office on January 22nd 2019 by myself.  Since this visit she was able to transition from IVIG to subcutaneous immunoglobulin with Hizentra.  Her partner has been able to start her Hizentra infusions without any problems.  She has been using her abdomen and alternating sites.  She did try using her thighs once and she stated that she did not like this so she will not be using this site again.  She has been doing weekly infusions.  She has not had an IgG level drawn since she has started on Hizentra.  She denies needing any antibiotics since her last visit with [place]. She does states her biggest problem right now is her allergy symptoms.  She is having a lot of watery eyes, nasal congestion and drainage and sneezing.  She has been taking both Astelin and Flonase and did not feel they have been helpful.  She has been taking Benadryl as needed.    She recently saw her local pulmonary physician Dr. Carren Rang.  She states she has been in to use her albuterol nebulizer multiple times a day.  She is on Breo 1 puff once a day as well as Spiriva.   Review of systems: Review of Systems  Constitutional: Positive for malaise/fatigue. Negative for chills, fever and weight loss.  HENT: Positive for congestion. Negative for ear discharge, ear pain, nosebleeds and sore throat.   Eyes: Negative for pain, discharge and redness.  Respiratory: Positive for cough, shortness of breath and wheezing.   Cardiovascular: Negative for chest pain.  Gastrointestinal: Negative for abdominal pain, constipation, diarrhea, nausea and vomiting.  Musculoskeletal: Positive for  back pain.  Neurological: Negative for headaches.    All other systems negative unless noted above in HPI  Past medical/social/surgical/family history have been reviewed and are unchanged unless specifically indicated below.  No changes  Medication List: Allergies as of 12/27/2017      Reactions   Carimune [immune Globulin] Anaphylaxis   Imitrex [sumatriptan] Palpitations   Morphine Itching   Tramadol Palpitations   Adhesive [tape] Other (See Comments)   TAKES SKIN OFF (paper/silk tape) MEDICAL TAPE CAUSES BRUISES AND TEARS SKIN,  PAPER TAPE IS OK   Amitriptyline Palpitations   Buprenorphine Hcl Itching   Morphine And Related Itching      Medication List        Accurate as of 12/27/17  5:09 PM. Always use your most recent med list.          albuterol 108 (90 Base) MCG/ACT inhaler Commonly known as:  PROVENTIL HFA;VENTOLIN HFA Inhale 1-2 puffs into the lungs every 6 (six) hours as needed for wheezing or shortness of breath.   ALPRAZolam 0.5 MG tablet Commonly known as:  XANAX Take 0.5 mg by mouth every 8 (eight) hours as needed for anxiety.   aspirin EC 81 MG tablet Take 81 mg by mouth daily.   azelastine 0.1 % nasal spray Commonly known as:  ASTELIN Place 2 sprays into both nostrils 2 (two) times daily. Use in each nostril as directed   BREO ELLIPTA 200-25 MCG/INH Aepb Generic drug:  fluticasone furoate-vilanterol Inhale  1 puff into the lungs daily.   butalbital-acetaminophen-caffeine 50-325-40 MG tablet Commonly known as:  FIORICET, ESGIC TAKE 1-2 TABLET(S) BY MOUTH EVERY 4 HOURS AS NEEDED FOR PAIN.   cetirizine 10 MG tablet Commonly known as:  ZYRTEC Take one tablet once daily   DILT-XR 120 MG 24 hr capsule Generic drug:  diltiazem TAKE 1 CAPSULE BY MOUTH ONCE DAILY   esomeprazole 40 MG capsule Commonly known as:  NEXIUM Take 40 mg by mouth 2 (two) times daily before a meal.   eszopiclone 3 MG Tabs Generic drug:  Eszopiclone Take 3 mg by mouth at  bedtime. Take immediately before bedtime   FISH OIL PO Take 2,000 mg by mouth 2 (two) times daily.   fluticasone 50 MCG/ACT nasal spray Commonly known as:  FLONASE Place 1 spray into both nostrils daily.   gabapentin 300 MG capsule Commonly known as:  NEURONTIN Take 600 mg by mouth 3 (three) times daily.   HYQVIA [place] Inject into the skin.   insulin glargine 100 UNIT/ML injection Commonly known as:  LANTUS Inject into the skin at bedtime as needed (high blood sugar).   insulin lispro 100 UNIT/ML injection Commonly known as:  HUMALOG Inject 10 Units into the skin 3 (three) times daily as needed for high blood sugar (for high blood sugar greather than 150).   ipratropium 0.06 % nasal spray Commonly known as:  ATROVENT Use two sprays in each nostril 3-4 times daily if needed for nasal drainage/congestion.   isosorbide mononitrate 30 MG 24 hr tablet Commonly known as:  IMDUR Take 1 tablet (30 mg total) by mouth daily.   lacosamide 200 MG Tabs tablet Commonly known as:  VIMPAT Take 200 mg by mouth 2 (two) times daily.   lamoTRIgine 25 MG tablet Commonly known as:  LAMICTAL Take 2 tablets by mouth 2 (two) times daily.   montelukast 10 MG tablet Commonly known as:  SINGULAIR Take one tablet once daily as directed   nitroGLYCERIN 0.4 MG SL tablet Commonly known as:  NITROSTAT Place 1 tablet (0.4 mg total) under the tongue every 5 (five) minutes as needed for chest pain.   nystatin 100000 UNIT/ML suspension Commonly known as:  MYCOSTATIN Take 5 mLs by mouth 3 (three) times daily as needed (FOR THRUSH).   olmesartan 20 MG tablet Commonly known as:  BENICAR Take 20 mg by mouth daily.   oxyCODONE-acetaminophen 10-325 MG tablet Commonly known as:  PERCOCET One tablet every 4 hours as needed for pain.   PAZEO 0.7 % Soln Generic drug:  Olopatadine HCl Place 1 drop into both eyes daily as needed.   promethazine 25 MG tablet Commonly known as:  PHENERGAN Take 25 mg by  mouth every 6 (six) hours as needed for nausea or vomiting.   ranitidine 300 MG tablet Commonly known as:  ZANTAC Take 300 mg by mouth at bedtime.   ranolazine 500 MG 12 hr tablet Commonly known as:  RANEXA Take 1 tablet (500 mg total) by mouth 2 (two) times daily.   rOPINIRole 1 MG tablet Commonly known as:  REQUIP Take 3 mg by mouth at bedtime.   rosuvastatin 5 MG tablet Commonly known as:  CRESTOR TAKE 1 TABLET BY MOUTH ONCE DAILY   tiotropium 18 MCG inhalation capsule Commonly known as:  SPIRIVA Place 18 mcg into inhaler and inhale daily.   tiZANidine 4 MG capsule Commonly known as:  ZANAFLEX Take 4 mg by mouth 3 (three) times daily.   Vitamin D (Ergocalciferol) 50000  units Caps capsule Commonly known as:  DRISDOL Take 50,000 Units by mouth every Tuesday.       Known medication allergies: Allergies  Allergen Reactions  . Carimune [Immune Globulin] Anaphylaxis  . Imitrex [Sumatriptan] Palpitations  . Morphine Itching  . Tramadol Palpitations  . Adhesive [Tape] Other (See Comments)    TAKES SKIN OFF (paper/silk tape) MEDICAL TAPE CAUSES BRUISES AND TEARS SKIN,  PAPER TAPE IS OK  . Amitriptyline Palpitations  . Buprenorphine Hcl Itching  . Morphine And Related Itching     Physical examination: Blood pressure 92/60, pulse 76, resp. rate 18.  General: Alert, interactive, in no acute distress. HEENT: PERRLA, TMs pearly gray, turbinates mildly edematous with clear discharge, post-pharynx non erythematous. Neck: Supple without lymphadenopathy. Lungs: Clear to auscultation without wheezing, rhonchi or rales. {no increased work of breathing. CV: Normal S1, S2 without murmurs. Abdomen: Nondistended, nontender.  Palpable nodules under skin from hizentra infusion yesterday Skin: Warm and dry, without lesions or rashes. Extremities:  No clubbing, cyanosis or edema. Neuro:   Grossly intact.  Diagnositics/Labs: None today  Assessment and plan:   Common Variable  Immunodeficiency (CVID)    - transitioned from IVIG to subcutaneous immunoglobulin (Hizentra).  Continue Hizentra infusions weekly.  Alternate injection sites over abdomen.    - Will obtain IgG level today and if increased will recommend every 2 week infusions.    Allergic rhinoconjunctivitis  - stop astelin and flonase as not effective at t his time  -start Nasal atrovent 2 sprays each nostril up to 3-4 times a day as needed for nasal congestion/drainage  - start Zyrtec 10mg  daily  - start singulair 10mg  daily   - for itchy/watery/red eyes use Pat[place] or Pazeo 1 drop each eye daily as needed  COPD/Asthma   - continue your current medications as per Dr. Carren Rang at this time-- Breo, spiriva and as needed albuterol    - will add in singulair for additional control    Follow-up in 6 months or sooner if needed.  We will call with IgG results as soon as it returns  I appreciate the opportunity to take part in [facility] care. Please do not hesitate to contact me with questions.  Sincerely,   Prudy Feeler, MD Allergy/Immunology [facility]

## 2017-12-27 NOTE — Patient Instructions (Addendum)
Common Variable Immunodeficiency (CVID)    - transitioned from IVIG to subcutaneous immunoglobulin (Hizentra).  Continue Hizentra infusions weekly.  Alternate injection sites over abdomen.    - Will obtain IgG level today and if increased will recommend every 2 week infusions.    Allergies  - stop astelin and flonase as not effective at t his time  -start Nasal atrovent 2 sprays each nostril up to 3-4 times a day as needed for nasal congestion/drainage  - start Zyrtec 10mg  daily  - start singulair 10mg  daily   - for itchy/watery/red eyes use Pataday or Pazeo 1 drop each eye daily as needed  COPD/Asthma   - continue your current medications as per Dr. Carren Rang at this time-- Breo, spiriva and as needed albuterol    - will add in singulair for additional control    Follow-up in 6 months or sooner if needed.  We will call with IgG results as soon as it returns

## 2017-12-28 LAB — IGG: IgG (Immunoglobin G), Serum: 970 mg/dL (ref 700–1600)

## 2018-01-18 ENCOUNTER — Ambulatory Visit: Payer: Medicare Other | Admitting: Cardiology

## 2018-01-19 ENCOUNTER — Encounter: Payer: Self-pay | Admitting: Cardiology

## 2018-01-19 ENCOUNTER — Ambulatory Visit (INDEPENDENT_AMBULATORY_CARE_PROVIDER_SITE_OTHER): Payer: Medicare Other | Admitting: Cardiology

## 2018-01-19 VITALS — BP 140/70 | HR 71 | Ht 65.0 in | Wt 202.8 lb

## 2018-01-19 DIAGNOSIS — IMO0001 Reserved for inherently not codable concepts without codable children: Secondary | ICD-10-CM

## 2018-01-19 DIAGNOSIS — F17200 Nicotine dependence, unspecified, uncomplicated: Secondary | ICD-10-CM | POA: Diagnosis not present

## 2018-01-19 DIAGNOSIS — R072 Precordial pain: Secondary | ICD-10-CM | POA: Diagnosis not present

## 2018-01-19 DIAGNOSIS — J431 Panlobular emphysema: Secondary | ICD-10-CM | POA: Diagnosis not present

## 2018-01-19 MED ORDER — AMLODIPINE BESYLATE 5 MG PO TABS: 5.0000 mg | ORAL_TABLET | Freq: Every day | ORAL | 6 refills | Status: DC

## 2018-01-19 NOTE — Patient Instructions (Signed)
Medication Instructions:  Your physician has recommended you make the following change in your medication:  STOP isosorbide mononitrate (imdur) STOP diltiazem START amlodipine (norvasc) 5 mg daily  Labwork: None  Testing/Procedures: None  Follow-Up: Your physician recommends that you schedule a follow-up appointment in: 2 months.   If you need a refill on your cardiac medications before your next appointment, please call your pharmacy.   Thank you for choosing [facility]! Robyne Peers, RN [phone]

## 2018-01-19 NOTE — Progress Notes (Signed)
Cardiology Office Note:    Date:  01/19/2018   ID:  Kathy Moss, DOB 30-Oct-1960, MRN [id number]  PCP:  Raina Mina., MD  Cardiologist:  Jenne Campus, MD    Referring MD: Raina Mina., MD   Chief Complaint  Patient presents with  . 1 month follow up  I am feeling better but have headache  History of Present Illness:    Kathy Moss is a 57 y.o. female with atypical chest pain.  Cardiac catheterization done on 03/16/2017 showing luminal disease.  I suspect the symptoms are related to esophageal spasm.  I gave her long-acting nitroglycerin which gave her good relief however she complained of having headache.  Therefore we decided to stop her Cardizem and put her on 5 mg Norvasc discontinue long-acting nitroglycerin to see if that helps.  Past Medical History:  Diagnosis Date  . Angina at rest [facility]) 2013  . Asthma   . CVID (common variable immunodeficiency) ([place])   . Hypertension   . Recurrent upper respiratory infection (URI)   . Seizures ([place])   . Type 2 diabetes mellitus without complication, without long-term current use of insulin ([place]) 06/30/2016    Past Surgical History:  Procedure Laterality Date  . APPENDECTOMY    . BACK SURGERY    . CHOLECYSTECTOMY    . HERNIA REPAIR    . LEFT HEART CATH AND CORONARY ANGIOGRAPHY N/A 03/16/2017   Procedure: Left Heart Cath and Coronary Angiography;  Surgeon: Jettie Booze, MD;  Location: [place] [facility];  Service: Cardiovascular;  Laterality: N/A;    Current Medications: Current Meds  Medication Sig  . albuterol (PROVENTIL HFA;VENTOLIN HFA) 108 (90 Base) MCG/ACT inhaler Inhale 1-2 puffs into the lungs every 6 (six) hours as needed for wheezing or shortness of breath.  . ALPRAZolam (XANAX) 0.5 MG tablet Take 0.5 mg by mouth every 8 (eight) hours as needed for anxiety.   Marland Kitchen aspirin EC 81 MG tablet Take 81 mg by mouth daily.  Marland Kitchen azelastine (ASTELIN) 0.1 % nasal spray Place 2 sprays into both nostrils 2 (two) times  daily. Use in each nostril as directed   . butalbital-acetaminophen-caffeine (FIORICET, ESGIC) 50-325-40 MG tablet TAKE 1-2 TABLET(S) BY MOUTH EVERY 4 HOURS AS NEEDED FOR PAIN.  . cetirizine (ZYRTEC) 10 MG tablet Take one tablet once daily  . DILT-XR 120 MG 24 hr capsule TAKE 1 CAPSULE BY MOUTH ONCE DAILY  . esomeprazole (NEXIUM) 40 MG capsule Take 40 mg by mouth 2 (two) times daily before a meal.   . Eszopiclone (ESZOPICLONE) 3 MG TABS Take 3 mg by mouth at bedtime. Take immediately before bedtime  . fluticasone (FLONASE) 50 MCG/ACT nasal spray Place 1 spray into both nostrils daily.  . fluticasone furoate-vilanterol (BREO ELLIPTA) 200-25 MCG/INH AEPB Inhale 1 puff into the lungs daily.   Marland Kitchen gabapentin (NEURONTIN) 300 MG capsule Take 600 mg by mouth 3 (three) times daily.   . Immune Globulin-Hyaluronidase (HYQVIA [place]) Inject into the skin.  Marland Kitchen insulin glargine (LANTUS) 100 UNIT/ML injection Inject into the skin at bedtime as needed (high blood sugar).  . insulin lispro (HUMALOG) 100 UNIT/ML injection Inject 10 Units into the skin 3 (three) times daily as needed for high blood sugar (for high blood sugar greather than 150).  Marland Kitchen ipratropium (ATROVENT) 0.06 % nasal spray Use two sprays in each nostril 3-4 times daily if needed for nasal drainage/congestion.  . isosorbide mononitrate (IMDUR) 30 MG 24 hr tablet Take 1 tablet (30  mg total) by mouth daily.  Marland Kitchen lacosamide (VIMPAT) 200 MG TABS tablet Take 200 mg by mouth 2 (two) times daily.  Marland Kitchen lamoTRIgine (LAMICTAL) 25 MG tablet Take 2 tablets by mouth 2 (two) times daily.  . montelukast (SINGULAIR) 10 MG tablet Take one tablet once daily as directed  . nitroGLYCERIN (NITROSTAT) 0.4 MG SL tablet Place 1 tablet (0.4 mg total) under the tongue every 5 (five) minutes as needed for chest pain.  Marland Kitchen nystatin (MYCOSTATIN) 100000 UNIT/ML suspension Take 5 mLs by mouth 3 (three) times daily as needed (FOR THRUSH).   Marland Kitchen olmesartan (BENICAR) 20 MG tablet Take 20 mg by  mouth daily.  . Omega-3 Fatty Acids (FISH OIL PO) Take 2,000 mg by mouth 2 (two) times daily.   Marland Kitchen oxyCODONE-acetaminophen (PERCOCET) 10-325 MG tablet One tablet every 4 hours as needed for pain.  Marland Kitchen PAZEO 0.7 % SOLN Place 1 drop into both eyes daily as needed.  . promethazine (PHENERGAN) 25 MG tablet Take 25 mg by mouth every 6 (six) hours as needed for nausea or vomiting.  . ranitidine (ZANTAC) 300 MG tablet Take 300 mg by mouth at bedtime.  . ranolazine (RANEXA) 500 MG 12 hr tablet Take 1 tablet (500 mg total) by mouth 2 (two) times daily.  Marland Kitchen rOPINIRole (REQUIP) 1 MG tablet Take 3 mg by mouth at bedtime.   . rosuvastatin (CRESTOR) 5 MG tablet TAKE 1 TABLET BY MOUTH ONCE DAILY  . tiotropium (SPIRIVA) 18 MCG inhalation capsule Place 18 mcg into inhaler and inhale daily.  Marland Kitchen tiZANidine (ZANAFLEX) 4 MG capsule Take 4 mg by mouth 3 (three) times daily.  . Vitamin D, Ergocalciferol, (DRISDOL) 50000 units CAPS capsule Take 50,000 Units by mouth every Tuesday.      Allergies:   Carimune [immune globulin]; Imitrex [sumatriptan]; Morphine; Tramadol; Adhesive [tape]; Amitriptyline; Buprenorphine hcl; and Morphine and related   Social History   Socioeconomic History  . Marital status: Married    Spouse name: Not on file  . Number of children: 0  . Years of education: HS  . Highest education level: Not on file  Occupational History  . Occupation: Disabled  Social Needs  . Financial resource strain: Not on file  . Food insecurity:    Worry: Not on file    Inability: Not on file  . Transportation needs:    Medical: Not on file    Non-medical: Not on file  Tobacco Use  . Smoking status: Current Every Day Smoker    Packs/day: 0.50    Years: 36.00    Pack years: 18.00    Types: Cigarettes  . Smokeless tobacco: Never Used  Substance and Sexual Activity  . Alcohol use: No  . Drug use: No  . Sexual activity: Not on file  Lifestyle  . Physical activity:    Days per week: Not on file     Minutes per session: Not on file  . Stress: Not on file  Relationships  . Social connections:    Talks on phone: Not on file    Gets together: Not on file    Attends religious service: Not on file    Active member of club or organization: Not on file    Attends meetings of clubs or organizations: Not on file    Relationship status: Not on file  Other Topics Concern  . Not on file  Social History Narrative   Lives at home with her spouse, [name].   Right-handed.   1 cup  coffee and  3-4 sodas per day.     Family History: The patient's family history includes Allergic rhinitis in her brother; Asthma in her mother; COPD in her brother; Heart attack in her father; High blood pressure in her mother; Lung cancer in her mother; Seizures in her mother; Stroke in her mother. ROS:   Please see the history of present illness.    All 14 point review of systems negative except as described per history of present illness  EKGs/Labs/Other Studies Reviewed:      Recent Labs: 03/10/2017: ALT 9 03/20/2017: BUN 13; Creatinine, Ser 1.15; Hemoglobin 14.7; Platelets 228; Potassium 4.3; Sodium 136  Recent Lipid Panel No results found for: CHOL, TRIG, HDL, CHOLHDL, VLDL, LDLCALC, LDLDIRECT  Physical Exam:    VS:  BP 140/70   Pulse 71   Ht 5\' 5"  (1.651 m)   Wt 202 lb 12.8 oz (92 kg)   SpO2 93%   BMI 33.75 kg/m     Wt Readings from Last 3 Encounters:  01/19/18 202 lb 12.8 oz (92 kg)  12/14/17 204 lb 1.9 oz (92.6 kg)  04/18/17 194 lb 4 oz (88.1 kg)     GEN:  Well nourished, well developed in no acute distress HEENT: Normal NECK: No JVD; No carotid bruits LYMPHATICS: No lymphadenopathy CARDIAC: RRR, no murmurs, no rubs, no gallops RESPIRATORY:  Clear to auscultation without rales, wheezing or rhonchi  ABDOMEN: Soft, non-tender, non-distended MUSCULOSKELETAL:  No edema; No deformity  SKIN: Warm and dry LOWER EXTREMITIES: no swelling NEUROLOGIC:  Alert and oriented x 3 PSYCHIATRIC:   Normal affect   ASSESSMENT:    1. Precordial pain   2. Smoking   3. Panlobular emphysema ([place])    PLAN:    In order of problems listed above:  1. Precordial pain: Plan as outlined above. 2. Smoking again she was told to quit she understands she is trying to work on it 3. Emphysema: Again related to smoking the key will be to quit smoking.   Medication Adjustments/Labs and Tests Ordered: Current medicines are reviewed at length with the patient today.  Concerns regarding medicines are outlined above.  No orders of the defined types were placed in this encounter.  Medication changes: No orders of the defined types were placed in this encounter.   Signed, Park Liter, MD, [facility] 01/19/2018 4:06 PM    [facility]

## 2018-01-25 ENCOUNTER — Other Ambulatory Visit: Payer: Self-pay | Admitting: Neurology

## 2018-02-24 DIAGNOSIS — I7090 Unspecified atherosclerosis: Secondary | ICD-10-CM | POA: Insufficient documentation

## 2018-02-24 DIAGNOSIS — H547 Unspecified visual loss: Secondary | ICD-10-CM | POA: Insufficient documentation

## 2018-02-24 HISTORY — DX: Unspecified visual loss: H54.7

## 2018-02-24 HISTORY — DX: Unspecified atherosclerosis: I70.90

## 2018-02-27 ENCOUNTER — Other Ambulatory Visit: Payer: Self-pay

## 2018-02-27 MED ORDER — RANOLAZINE ER 500 MG PO TB12: 500.0000 mg | ORAL_TABLET | Freq: Two times a day (BID) | ORAL | 6 refills | Status: DC

## 2018-03-06 ENCOUNTER — Telehealth: Payer: Self-pay | Admitting: Cardiology

## 2018-03-06 NOTE — Telephone Encounter (Signed)
Has some questions about getting a second opinion after seeing her PCP and they told her she needs to see a vascular doctor about having blockages in her neck

## 2018-03-06 NOTE — Telephone Encounter (Signed)
Informed [name] that the records have been requested and will be reviewed tomorrow by Agustin Cree.

## 2018-03-07 NOTE — Telephone Encounter (Signed)
Informed Kathy Moss that per Dr. Agustin Cree she may keep her appointment with vascular; however, he does not think that she needs intervention at this time.

## 2018-03-22 ENCOUNTER — Ambulatory Visit: Payer: Medicare Other | Admitting: Cardiology

## 2018-03-28 ENCOUNTER — Encounter: Payer: Self-pay | Admitting: Cardiology

## 2018-03-28 ENCOUNTER — Ambulatory Visit (INDEPENDENT_AMBULATORY_CARE_PROVIDER_SITE_OTHER): Payer: Medicare Other | Admitting: Cardiology

## 2018-03-28 VITALS — BP 96/64 | HR 66 | Ht 65.0 in | Wt 192.0 lb

## 2018-03-28 DIAGNOSIS — E119 Type 2 diabetes mellitus without complications: Secondary | ICD-10-CM

## 2018-03-28 DIAGNOSIS — F17200 Nicotine dependence, unspecified, uncomplicated: Secondary | ICD-10-CM | POA: Diagnosis not present

## 2018-03-28 DIAGNOSIS — E7800 Pure hypercholesterolemia, unspecified: Secondary | ICD-10-CM

## 2018-03-28 DIAGNOSIS — Z8679 Personal history of other diseases of the circulatory system: Secondary | ICD-10-CM

## 2018-03-28 HISTORY — DX: Personal history of other diseases of the circulatory system: Z86.79

## 2018-03-28 NOTE — Progress Notes (Signed)
Cardiology Office Note:    Date:  03/28/2018   ID:  MONTSERRATH MADDING, DOB 1960/11/02, MRN [id number]  PCP:  Raina Mina., MD  Cardiologist:  Jenne Campus, MD    Referring MD: Raina Mina., MD   Chief Complaint  Patient presents with  . Follow-up  I cannot see symptoms  History of Present Illness:    Kathy Moss is a 57 y.o. female with complex medical history she does have coronary artery disease cardiac catheterization done in 2018 summertime just about a year ago showed 40% mid RCA 10% marginal branch lesion.  She presented today to our office with a complaint of blindness it looks like she gets some amaurosis fugax from time to time there is been going on for about a month she already presented with those symptoms the primary care physician and MRI of the brain and neck has been done I do not have results of this I just have the fact that it was not patient telling me that she was told to find some blockages in the artery in the back of his head.  She does have appointment to talk to vascular surgeon on 16 August.  It is interesting she describes sensation that she stop seeing some that she goes to sleep and wake up and everything comes back to normal.  Past Medical History:  Diagnosis Date  . Angina at rest [facility]) 2013  . Asthma   . CVID (common variable immunodeficiency) ([place])   . Hypertension   . Recurrent upper respiratory infection (URI)   . Seizures ([place])   . Type 2 diabetes mellitus without complication, without long-term current use of insulin ([place]) 06/30/2016    Past Surgical History:  Procedure Laterality Date  . APPENDECTOMY    . BACK SURGERY    . CHOLECYSTECTOMY    . HERNIA REPAIR    . LEFT HEART CATH AND CORONARY ANGIOGRAPHY N/A 03/16/2017   Procedure: Left Heart Cath and Coronary Angiography;  Surgeon: Jettie Booze, MD;  Location: [place] [facility];  Service: Cardiovascular;  Laterality: N/A;    Current Medications: Current Meds  Medication  Sig  . albuterol (PROVENTIL HFA;VENTOLIN HFA) 108 (90 Base) MCG/ACT inhaler Inhale 1-2 puffs into the lungs every 6 (six) hours as needed for wheezing or shortness of breath.  . ALPRAZolam (XANAX) 0.5 MG tablet Take 0.5 mg by mouth every 8 (eight) hours as needed for anxiety.   Marland Kitchen amLODipine (NORVASC) 5 MG tablet Take 1 tablet (5 mg total) by mouth daily.  Marland Kitchen aspirin EC 81 MG tablet Take 81 mg by mouth daily.  Marland Kitchen azelastine (ASTELIN) 0.1 % nasal spray Place 2 sprays into both nostrils 2 (two) times daily. Use in each nostril as directed   . butalbital-acetaminophen-caffeine (FIORICET, ESGIC) 50-325-40 MG tablet TAKE 1-2 TABLET(S) BY MOUTH EVERY 4 HOURS AS NEEDED FOR PAIN.  . cetirizine (ZYRTEC) 10 MG tablet Take one tablet once daily  . esomeprazole (NEXIUM) 40 MG capsule Take 40 mg by mouth 2 (two) times daily before a meal.   . Eszopiclone (ESZOPICLONE) 3 MG TABS Take 3 mg by mouth at bedtime. Take immediately before bedtime  . fluticasone (FLONASE) 50 MCG/ACT nasal spray Place 1 spray into both nostrils daily.  . fluticasone furoate-vilanterol (BREO ELLIPTA) 200-25 MCG/INH AEPB Inhale 1 puff into the lungs daily.   Marland Kitchen gabapentin (NEURONTIN) 300 MG capsule Take 600 mg by mouth 3 (three) times daily.   . Immune Globulin-Hyaluronidase (HYQVIA [place]) Inject  into the skin.  Marland Kitchen insulin glargine (LANTUS) 100 UNIT/ML injection Inject into the skin at bedtime as needed (high blood sugar).  . insulin lispro (HUMALOG) 100 UNIT/ML injection Inject 10 Units into the skin 3 (three) times daily as needed for high blood sugar (for high blood sugar greather than 150).  Marland Kitchen ipratropium (ATROVENT) 0.06 % nasal spray Use two sprays in each nostril 3-4 times daily if needed for nasal drainage/congestion.  Marland Kitchen lacosamide (VIMPAT) 200 MG TABS tablet Take 200 mg by mouth 2 (two) times daily.  Marland Kitchen lamoTRIgine (LAMICTAL) 25 MG tablet Take 2 tablets by mouth 2 (two) times daily.  . montelukast (SINGULAIR) 10 MG tablet Take one tablet  once daily as directed  . nitroGLYCERIN (NITROSTAT) 0.4 MG SL tablet Place 1 tablet (0.4 mg total) under the tongue every 5 (five) minutes as needed for chest pain.  Marland Kitchen nystatin (MYCOSTATIN) 100000 UNIT/ML suspension Take 5 mLs by mouth 3 (three) times daily as needed (FOR THRUSH).   Marland Kitchen olmesartan (BENICAR) 20 MG tablet Take 20 mg by mouth daily.  . Omega-3 Fatty Acids (FISH OIL PO) Take 2,000 mg by mouth 2 (two) times daily.   Marland Kitchen oxyCODONE-acetaminophen (PERCOCET) 10-325 MG tablet One tablet every 4 hours as needed for pain.  Marland Kitchen PAZEO 0.7 % SOLN Place 1 drop into both eyes daily as needed.  . promethazine (PHENERGAN) 25 MG tablet Take 25 mg by mouth every 6 (six) hours as needed for nausea or vomiting.  . ranitidine (ZANTAC) 300 MG tablet Take 300 mg by mouth at bedtime.  . ranolazine (RANEXA) 500 MG 12 hr tablet Take 1 tablet (500 mg total) by mouth 2 (two) times daily.  Marland Kitchen rOPINIRole (REQUIP) 1 MG tablet Take 4 mg by mouth at bedtime. Take 4 mg at bedtime  . rosuvastatin (CRESTOR) 5 MG tablet TAKE 1 TABLET BY MOUTH ONCE DAILY  . tiotropium (SPIRIVA) 18 MCG inhalation capsule Place 18 mcg into inhaler and inhale daily.  Marland Kitchen tiZANidine (ZANAFLEX) 4 MG capsule Take 4 mg by mouth 3 (three) times daily.  . Vitamin D, Ergocalciferol, (DRISDOL) 50000 units CAPS capsule Take 50,000 Units by mouth every Tuesday.      Allergies:   Carimune [immune globulin]; Imitrex [sumatriptan]; Morphine; Tramadol; Adhesive [tape]; Amitriptyline; Buprenorphine hcl; and Morphine and related   Social History   Socioeconomic History  . Marital status: Married    Spouse name: Not on file  . Number of children: 0  . Years of education: HS  . Highest education level: Not on file  Occupational History  . Occupation: Disabled  Social Needs  . Financial resource strain: Not on file  . Food insecurity:    Worry: Not on file    Inability: Not on file  . Transportation needs:    Medical: Not on file    Non-medical: Not  on file  Tobacco Use  . Smoking status: Current Every Day Smoker    Packs/day: 0.50    Years: 36.00    Pack years: 18.00    Types: Cigarettes  . Smokeless tobacco: Never Used  Substance and Sexual Activity  . Alcohol use: No  . Drug use: No  . Sexual activity: Not on file  Lifestyle  . Physical activity:    Days per week: Not on file    Minutes per session: Not on file  . Stress: Not on file  Relationships  . Social connections:    Talks on phone: Not on file    Gets  together: Not on file    Attends religious service: Not on file    Active member of club or organization: Not on file    Attends meetings of clubs or organizations: Not on file    Relationship status: Not on file  Other Topics Concern  . Not on file  Social History Narrative   Lives at home with her spouse, [name].   Right-handed.   1 cup coffee and  3-4 sodas per day.     Family History: The patient's family history includes Allergic rhinitis in her brother; Asthma in her mother; COPD in her brother; Heart attack in her father; High blood pressure in her mother; Lung cancer in her mother; Seizures in her mother; Stroke in her mother. ROS:   Please see the history of present illness.    All 14 point review of systems negative except as described per history of present illness  EKGs/Labs/Other Studies Reviewed:      Recent Labs: No results found for requested labs within last 8760 hours.  Recent Lipid Panel No results found for: CHOL, TRIG, HDL, CHOLHDL, VLDL, LDLCALC, LDLDIRECT  Physical Exam:    VS:  BP 96/64 (BP Location: Right Arm, Patient Position: Sitting, Cuff Size: Normal)   Pulse 66   Ht 5\' 5"  (1.651 m)   Wt 192 lb (87.1 kg)   SpO2 97%   BMI 31.95 kg/m     Wt Readings from Last 3 Encounters:  03/28/18 192 lb (87.1 kg)  01/19/18 202 lb 12.8 oz (92 kg)  12/14/17 204 lb 1.9 oz (92.6 kg)     GEN:  Well nourished, well developed in no acute distress HEENT: Normal NECK: No JVD; No  carotid bruits LYMPHATICS: No lymphadenopathy CARDIAC: RRR, no murmurs, no rubs, no gallops RESPIRATORY:  Clear to auscultation without rales, wheezing or rhonchi  ABDOMEN: Soft, non-tender, non-distended MUSCULOSKELETAL:  No edema; No deformity  SKIN: Warm and dry LOWER EXTREMITIES: no swelling NEUROLOGIC:  Alert and oriented x 3 PSYCHIATRIC:  Normal affect   ASSESSMENT:    1. Type 2 diabetes mellitus without complication, without long-term current use of insulin ([place])   2. H/O peripheral vascular disease   3. Smoking   4. Pure hypercholesterolemia    PLAN:    In order of problems listed above:  1. Vascular disease: Will get a copy of MRI as well as CT of her head to see exactly what was identified.  She is scheduled to see vascular surgeon.  If truly she does have critical significant vascular problems she benefits from Plavix/clopidogrel.  We talked about quitting smoking she reduce cigarettes to only half pack per day rather than to like used to smoke before she is already on statin however only 5 mg of Crestor since she does have difficulty tolerating higher dosages. 2. Smoking only half pack per day I congratulated her for this and strongly recommend to complete. 3. Dyslipidemia able to tolerate only small dose of statin will try to increase dose.  See her back in my office in about 2 months sooner if she get a problem   Medication Adjustments/Labs and Tests Ordered: Current medicines are reviewed at length with the patient today.  Concerns regarding medicines are outlined above.  No orders of the defined types were placed in this encounter.  Medication changes: No orders of the defined types were placed in this encounter.   Signed, Park Liter, MD, [facility] 03/28/2018 4:15 PM    [facility]

## 2018-03-28 NOTE — Patient Instructions (Signed)
Medication Instructions:   Your physician recommends that you continue on your current medications as directed. Please refer to the Current Medication list given to you today.  Labwork:  None  Testing/Procedures:  None  Follow-Up:  Your physician recommends that you schedule a follow-up appointment in: 2 months.  Any Other Special Instructions Will Be Listed Below (If Applicable).  If you need a refill on your cardiac medications before your next appointment, please call your pharmacy. 

## 2018-03-30 ENCOUNTER — Other Ambulatory Visit: Payer: Self-pay

## 2018-03-30 ENCOUNTER — Telehealth: Payer: Self-pay | Admitting: Cardiology

## 2018-03-30 MED ORDER — CLOPIDOGREL BISULFATE 75 MG PO TABS: 75.0000 mg | ORAL_TABLET | Freq: Every day | ORAL | 2 refills | Status: DC

## 2018-03-30 NOTE — Telephone Encounter (Signed)
[name] called in to see why Kathy Moss's medicine has not been called in yet, she stated the medicine needs to be called in ASAP or she will come by and pick it up.

## 2018-03-30 NOTE — Telephone Encounter (Signed)
Call Plavix to [facility] 1 (today) was supposed to be done day before yesterday

## 2018-03-30 NOTE — Telephone Encounter (Signed)
Med has been sent in  

## 2018-04-03 DIAGNOSIS — G459 Transient cerebral ischemic attack, unspecified: Secondary | ICD-10-CM

## 2018-04-03 HISTORY — DX: Transient cerebral ischemic attack, unspecified: G45.9

## 2018-05-03 ENCOUNTER — Telehealth: Payer: Self-pay | Admitting: *Deleted

## 2018-05-03 NOTE — Telephone Encounter (Signed)
Per patient spouse [name] is having issues with subq Hizentra with some bleeding and soreness.  Per patient spouse this hs been happening for last few infusions and may want to go back to hospital.  I discussed with spouse that we should first have [facility] nurse come in to see what options there are regarding her subq infusions and possible pointers to prevent some of these issues.  If this fails will consider IVIG in home setting.  I did contact Stanton Kidney a [facility] and she advised to send order for skilled visits and nurse would go out to home to work with patient.  I did advise [name] patients spouse of this and they have a dose on hand due that they can work with and I will send orders to [facility]. If discussed with Dr Nelva Bush and she agrees with plan

## 2018-05-08 ENCOUNTER — Other Ambulatory Visit: Payer: Self-pay | Admitting: Allergy

## 2018-05-09 ENCOUNTER — Encounter: Payer: Self-pay | Admitting: Allergy

## 2018-05-09 ENCOUNTER — Ambulatory Visit (INDEPENDENT_AMBULATORY_CARE_PROVIDER_SITE_OTHER): Payer: Medicare Other | Admitting: Allergy

## 2018-05-09 VITALS — BP 118/82 | HR 72 | Resp 18

## 2018-05-09 DIAGNOSIS — D839 Common variable immunodeficiency, unspecified: Secondary | ICD-10-CM

## 2018-05-09 DIAGNOSIS — J449 Chronic obstructive pulmonary disease, unspecified: Secondary | ICD-10-CM

## 2018-05-09 MED ORDER — BUTALBITAL-APAP-CAFFEINE 50-325-40 MG PO TABS: ORAL_TABLET | ORAL | 0 refills | Status: DC

## 2018-05-09 NOTE — Progress Notes (Signed)
Follow-up Note  RE: Kathy Moss MRN: [id number] DOB: 1960/09/07 Date of Office Visit: 05/09/2018   History of present illness: Kathy Moss is a 57 y.o. female presenting today for follow-up of CVID.  She also history of allergic rhinoconjunctivitis and also has a history of COPD/Asthma followed by local pulmonologist, Dr. Carren Rang.     Her and her partner just returned from [place] mountain vacation.   She states she did develop sore throat with difficulty swallowing, congestion and overall feels bad.  She was seen by her PCP who per pt noted she was told her throat was red.  She was prescribed levaquin and a steroid course which she started yesterday.  She denies having fevers.  This is her first antibiotic since starting on Ig replacement now for past 1.5 years.   Otherwise she states from an infectious standpoint she has been doing well.  She does complain of having issues with subcutaneous infusions she is doing currently of hizentra.  Her partner is providing the infusions.  She now is only using her left abdomen and she states it is too painful in her right abdomen.  She does not use her thighs any longer and that was too painful as well.   She does not want to use her flank areas as she has history of back pain with surgery and does not want to go near her back.  Her partner states she has a lot of swelling at the site as well as has noticed some bleeding around the injection site.   She also reports some migraines after infusions and was previously using fiorcet as needed but reports rare use of fiorcet.  She states this medication was not refilled for her and she is out.   She otherwise has not had any further surgeries since last visit and has not had any major health changes.  She continues on her routine medications including breo and singulair for COPD/Asthma management.   Review of systems: Review of Systems  Constitutional: Negative for chills, fever and malaise/fatigue.  HENT:  Positive for congestion and sore throat. Negative for ear discharge, ear pain, nosebleeds and sinus pain.   Eyes: Negative for pain, discharge and redness.  Respiratory: Positive for cough, sputum production and shortness of breath. Negative for hemoptysis, wheezing and stridor.   Cardiovascular: Negative for chest pain.  Gastrointestinal: Negative for abdominal pain, constipation, diarrhea, nausea and vomiting.  Musculoskeletal: Positive for back pain and joint pain.  Skin: Negative for itching and rash.  Neurological: Positive for headaches. Negative for dizziness.    All other systems negative unless noted above in HPI  Past medical/social/surgical/family history have been reviewed and are unchanged unless specifically indicated below.  No changes  Medication List: Allergies as of 05/09/2018      Reactions   Carimune [immune Globulin] Anaphylaxis   Imitrex [sumatriptan] Palpitations   Morphine Itching   Tramadol Palpitations   Adhesive [tape] Other (See Comments)   TAKES SKIN OFF (paper/silk tape) MEDICAL TAPE CAUSES BRUISES AND TEARS SKIN,  PAPER TAPE IS OK   Nsaids    Renal insuff   Amitriptyline Palpitations   Buprenorphine Hcl Itching   Morphine And Related Itching      Medication List        Accurate as of 05/09/18  4:55 PM. Always use your most recent med list.          albuterol 108 (90 Base) MCG/ACT inhaler Commonly known as:  PROVENTIL HFA;VENTOLIN  HFA Inhale 1-2 puffs into the lungs every 6 (six) hours as needed for wheezing or shortness of breath.   allopurinol 100 MG tablet Commonly known as:  ZYLOPRIM   ALPRAZolam 0.5 MG tablet Commonly known as:  XANAX Take 0.5 mg by mouth every 8 (eight) hours as needed for anxiety.   amLODipine 5 MG tablet Commonly known as:  NORVASC Take 1 tablet (5 mg total) by mouth daily.   aspirin EC 81 MG tablet Take 81 mg by mouth daily.   azelastine 0.1 % nasal spray Commonly known as:  ASTELIN Place 2 sprays into  both nostrils 2 (two) times daily. Use in each nostril as directed   BREO ELLIPTA 200-25 MCG/INH Aepb Generic drug:  fluticasone furoate-vilanterol Inhale 1 puff into the lungs daily.   butalbital-acetaminophen-caffeine 50-325-40 MG tablet Commonly known as:  FIORICET, ESGIC TAKE 1-2 TABLET(S) BY MOUTH EVERY 4 HOURS AS NEEDED FOR PAIN.   cetirizine 10 MG tablet Commonly known as:  ZYRTEC Take one tablet once daily   clopidogrel 75 MG tablet Commonly known as:  PLAVIX Take 1 tablet (75 mg total) by mouth daily.   dicyclomine 10 MG capsule Commonly known as:  BENTYL   esomeprazole 40 MG capsule Commonly known as:  NEXIUM Take 40 mg by mouth 2 (two) times daily before a meal.   eszopiclone 3 MG Tabs Generic drug:  Eszopiclone Take 3 mg by mouth at bedtime. Take immediately before bedtime   FISH OIL PO Take 2,000 mg by mouth 2 (two) times daily.   fluticasone 50 MCG/ACT nasal spray Commonly known as:  FLONASE Place 1 spray into both nostrils daily.   gabapentin 300 MG capsule Commonly known as:  NEURONTIN Take 600 mg by mouth 3 (three) times daily.   HYQVIA [place] Inject into the skin.   insulin glargine 100 UNIT/ML injection Commonly known as:  LANTUS Inject into the skin at bedtime as needed (high blood sugar).   insulin lispro 100 UNIT/ML injection Commonly known as:  HUMALOG Inject 10 Units into the skin 3 (three) times daily as needed for high blood sugar (for high blood sugar greather than 150).   ipratropium 0.06 % nasal spray Commonly known as:  ATROVENT Use two sprays in each nostril 3-4 times daily if needed for nasal drainage/congestion.   isosorbide mononitrate 30 MG 24 hr tablet Commonly known as:  IMDUR   lacosamide 200 MG Tabs tablet Commonly known as:  VIMPAT Take 200 mg by mouth 2 (two) times daily.   lamoTRIgine 25 MG tablet Commonly known as:  LAMICTAL Take 2 tablets by mouth 2 (two) times daily.   levofloxacin 750 MG tablet Commonly known  as:  LEVAQUIN   lidocaine-prilocaine cream Commonly known as:  EMLA   losartan 50 MG tablet Commonly known as:  COZAAR   montelukast 10 MG tablet Commonly known as:  SINGULAIR TAKE 1 TABLET BY MOUTH DAILY   nitroGLYCERIN 0.4 MG SL tablet Commonly known as:  NITROSTAT Place 1 tablet (0.4 mg total) under the tongue every 5 (five) minutes as needed for chest pain.   nystatin 100000 UNIT/ML suspension Commonly known as:  MYCOSTATIN Take 5 mLs by mouth 3 (three) times daily as needed (FOR THRUSH).   olmesartan 20 MG tablet Commonly known as:  BENICAR Take 20 mg by mouth daily.   oxyCODONE-acetaminophen 10-325 MG tablet Commonly known as:  PERCOCET One tablet every 4 hours as needed for pain.   PAZEO 0.7 % Soln Generic drug:  Olopatadine  HCl Place 1 drop into both eyes daily as needed.   predniSONE 10 MG tablet Commonly known as:  DELTASONE   promethazine 25 MG tablet Commonly known as:  PHENERGAN Take 25 mg by mouth every 6 (six) hours as needed for nausea or vomiting.   ranitidine 300 MG tablet Commonly known as:  ZANTAC Take 300 mg by mouth at bedtime.   ranolazine 500 MG 12 hr tablet Commonly known as:  RANEXA Take 1 tablet (500 mg total) by mouth 2 (two) times daily.   rOPINIRole 4 MG tablet Commonly known as:  REQUIP   rosuvastatin 5 MG tablet Commonly known as:  CRESTOR TAKE 1 TABLET BY MOUTH ONCE DAILY   tiotropium 18 MCG inhalation capsule Commonly known as:  SPIRIVA Place 18 mcg into inhaler and inhale daily.   tiZANidine 4 MG capsule Commonly known as:  ZANAFLEX Take 4 mg by mouth 3 (three) times daily.   VITAMIN B-12 IJ Inject as directed every 30 (thirty) days.   Vitamin D (Cholecalciferol) 1000 units Tabs Take 3 tablets by mouth daily.   Vitamin D (Ergocalciferol) 50000 units Caps capsule Commonly known as:  DRISDOL Take 50,000 Units by mouth every Tuesday.       Known medication allergies: Allergies  Allergen Reactions  . Carimune  [Immune Globulin] Anaphylaxis  . Imitrex [Sumatriptan] Palpitations  . Morphine Itching  . Tramadol Palpitations  . Adhesive [Tape] Other (See Comments)    TAKES SKIN OFF (paper/silk tape) MEDICAL TAPE CAUSES BRUISES AND TEARS SKIN,  PAPER TAPE IS OK  . Nsaids     Renal insuff  . Amitriptyline Palpitations  . Buprenorphine Hcl Itching  . Morphine And Related Itching     Physical examination: Blood pressure 118/82, pulse 72, resp. rate 18, SpO2 94 %.  General: Alert, interactive, in no acute distress. HEENT: PERRLA, TMs pearly gray, turbinates mildly edematous without discharge, post-pharynx non erythematous. Neck: Supple without lymphadenopathy. Lungs: Clear to auscultation without wheezing, rhonchi or rales. {no increased work of breathing. CV: Normal S1, S2 without murmurs. Abdomen: Nondistended, nontender. No palpable nodules on abdomen.  Well healed injection sites across abdomen Skin: Warm and dry, without lesions or rashes. Extremities:  No clubbing, cyanosis or edema. Neuro:   Grossly intact.  Diagnositics/Labs:  Spirometry: FEV1: 1.84L 66%, FVC: 2.45L 69%, stable  Assessment and plan:   Common Variable Immunodeficiency (CVID)    - transitioned from IVIG to subcutaneous immunoglobulin (Hizentra).  Continue Hizentra infusions every other week.  Alternate injection sites over abdomen.  Home health nurse to come out tomorrow to assist in infusion and provide help with troubleshooting infusions.  I did discuss the site options for injections today and she only wants to use her abdomen.   I am hopeful that with further assistance from home health nurse that we will be able to continue with Hizentra.      - despite current respiratory tract illness she looks great today.  I am happy to hear they were able to go on vacation which is great for [name] to get out of the house and be more mobile.    Allergies  - use nasal atrovent 2 sprays each nostril up to 3-4 times a day as  needed for nasal congestion/drainage  - use Zyrtec 10mg  daily as needed  - continue singulair 10mg  daily   - for itchy/watery/red eyes use Pataday or Pazeo 1 drop each eye daily as needed  COPD/Asthma   - continue your current medications as per  Dr. Carren Rang at this time-- Breo, spiriva and as needed albuterol    - continue singulair for additional control    Follow-up in 6 months or sooner if needed.     I appreciate the opportunity to take part in [place] care. Please do not hesitate to contact me with questions.  Sincerely,   Prudy Feeler, MD Allergy/Immunology [facility]

## 2018-05-09 NOTE — Patient Instructions (Addendum)
Common Variable Immunodeficiency (CVID)    - transitioned from IVIG to subcutaneous immunoglobulin (Hizentra).  Continue Hizentra infusions every other week.  Alternate injection sites over abdomen.  Home health nurse to come out tomorrow to assist in infusion and provide help with troubleshooting infusions.    Allergies  - use nasal atrovent 2 sprays each nostril up to 3-4 times a day as needed for nasal congestion/drainage  - use Zyrtec 10mg  daily as needed  - continue singulair 10mg  daily   - for itchy/watery/red eyes use Pataday or Pazeo 1 drop each eye daily as needed  COPD/Asthma   - continue your current medications as per Dr. Carren Rang at this time-- Breo, spiriva and as needed albuterol    - continue singulair for additional control    Follow-up in 6 months or sooner if needed.

## 2018-05-10 ENCOUNTER — Encounter: Payer: Self-pay | Admitting: Allergy

## 2018-05-10 ENCOUNTER — Telehealth: Payer: Self-pay | Admitting: *Deleted

## 2018-05-10 NOTE — Telephone Encounter (Signed)
Yes sounds good to change back to weekly dosing to decrease volume and thus reduce pain related to degree of volume at every 2 week dosing.

## 2018-05-10 NOTE — Telephone Encounter (Signed)
Per Melissa at [facility] after having wrong nurse to handle home health visit this am due to no experience with IVIG or SQIG another nurse was sent to consult with Neomia Glass partner regarding issues with abdominal reactions and painful sites.  Abigail Butts the nurse reviewed instructions with Pam and patient and upon questioning the reaction issues came to the conclusion that the increase dose to 34 grams every 14 days probably led to her increased issues and discussion led nurse and patient to express interest to going back to 17 grams every 14 days due to less reaction issues and less time in infusion. I did consult with Dr Nelva Bush and she has agreed with this plan and new order faxed to [facility] with dosage change

## 2018-06-15 ENCOUNTER — Ambulatory Visit: Payer: Medicare Other | Admitting: Cardiology

## 2018-06-20 ENCOUNTER — Other Ambulatory Visit: Payer: Self-pay | Admitting: Cardiology

## 2018-06-27 ENCOUNTER — Ambulatory Visit: Payer: Medicare Other | Admitting: Allergy

## 2018-07-06 DIAGNOSIS — F419 Anxiety disorder, unspecified: Secondary | ICD-10-CM | POA: Insufficient documentation

## 2018-07-06 HISTORY — DX: Anxiety disorder, unspecified: F41.9

## 2018-07-19 ENCOUNTER — Ambulatory Visit: Payer: Medicare Other | Admitting: Cardiology

## 2018-08-03 ENCOUNTER — Other Ambulatory Visit: Payer: Self-pay | Admitting: Allergy

## 2018-08-14 ENCOUNTER — Other Ambulatory Visit: Payer: Self-pay | Admitting: Cardiology

## 2018-09-01 ENCOUNTER — Other Ambulatory Visit: Payer: Self-pay | Admitting: Cardiology

## 2018-09-07 ENCOUNTER — Ambulatory Visit (INDEPENDENT_AMBULATORY_CARE_PROVIDER_SITE_OTHER): Payer: Medicare Other | Admitting: Cardiology

## 2018-09-07 ENCOUNTER — Encounter: Payer: Self-pay | Admitting: Cardiology

## 2018-09-07 VITALS — BP 150/80 | HR 64 | Wt 189.2 lb

## 2018-09-07 DIAGNOSIS — J431 Panlobular emphysema: Secondary | ICD-10-CM | POA: Diagnosis not present

## 2018-09-07 DIAGNOSIS — R072 Precordial pain: Secondary | ICD-10-CM | POA: Diagnosis not present

## 2018-09-07 DIAGNOSIS — E119 Type 2 diabetes mellitus without complications: Secondary | ICD-10-CM

## 2018-09-07 DIAGNOSIS — F17200 Nicotine dependence, unspecified, uncomplicated: Secondary | ICD-10-CM
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
## 2018-09-07 NOTE — Patient Instructions (Signed)
Medication Instructions:  Your physician recommends that you continue on your current medications as directed. Please refer to the Current Medication list given to you today.  If you need a refill on your cardiac medications before your next appointment, please call your pharmacy.   Lab work: None.  If you have labs (blood work) drawn today and your tests are completely normal, you will receive your results only by: . MyChart Message (if you have MyChart) OR . A paper copy in the mail If you have any lab test that is abnormal or we need to change your treatment, we will call you to review the results.  Testing/Procedures: None.   Follow-Up: At [facility], you and your health needs are our priority.  As part of our continuing mission to provide you with exceptional heart care, we have created designated Provider Care Teams.  These Care Teams include your primary Cardiologist (physician) and Advanced Practice Providers (APPs -  Physician Assistants and Nurse Practitioners) who all work together to provide you with the care you need, when you need it. You will need a follow up appointment in 3 months.  Please call our office 2 months in advance to schedule this appointment.  You may see No primary care provider on file. or another member of our [facility] Provider Team in [place]: Brian Munley, MD . Rajan Revankar, MD  Any Other Special Instructions Will Be Listed Below (If Applicable).     

## 2018-09-14 ENCOUNTER — Other Ambulatory Visit: Payer: Self-pay | Admitting: Orthopaedic Surgery

## 2018-09-14 DIAGNOSIS — M4326 Fusion of spine, lumbar region: Secondary | ICD-10-CM

## 2018-09-19 ENCOUNTER — Telehealth: Payer: Self-pay | Admitting: Cardiology

## 2018-09-19 NOTE — Telephone Encounter (Signed)
They have called back again and she needs to have this done within the next 10days.Marland KitchenMarland Kitchenplease call her back asap.. she wants to stop Plavix.Marland Kitchen

## 2018-09-19 NOTE — Telephone Encounter (Signed)
Needs a CT scan that her lung doctor ordered but states they want her to come off Plavix first

## 2018-09-19 NOTE — Telephone Encounter (Signed)
Patient reports she needs to have a ct myelogram per her pulmonologist. She needs to know if she can stop her plavix for 5 days before. She would like is to reach out to [facility] and [facility] and inform of our decision on this. I will route to Dr. Geraldo Pitter. Once we have an answer I will notify patient and call Abigail Butts at [phone] ext [phone]

## 2018-09-20 NOTE — Telephone Encounter (Signed)
Primary care has called back. They are scheduling a appointment with primary care and they will address this.

## 2018-09-20 NOTE — Telephone Encounter (Signed)
If the patient has not had a cardiac cath or a stent in the past 1 year then I am fine for the patient to hold her Plavix.  Please check this with the patient.  We need to confirm this.  But if the Plavix was initiated for any other reason other than cardiovascular reason as you indicated, then primary care will have to address this.

## 2018-09-20 NOTE — Telephone Encounter (Signed)
Spoke with [name] patient's spouse. She reports they are unaware why she is even on plavix as this was orginally started by her pcp Dr. Bea Graff. I advised they will need to contact his office for this clearance, they have tried and been unsuccessful. I have called Dr. Willette Pa office and informed them that they will need to complete this clearance. I left a message notifying the patient of this and to call back with any other issues.

## 2018-09-20 NOTE — Telephone Encounter (Signed)
From the records it appears that the patient does not have a coronary stent.  I am not sure why the patient is on Plavix.  If the patient has no coronary stent from a cardiovascular standpoint it can be held for the procedure.  If the clopidogrel was given for any other reason like neurological reason then the patient will have a 2 get that clearance from primary care physician.  Please talk to me if they have any questions

## 2018-09-23 ENCOUNTER — Other Ambulatory Visit: Payer: Self-pay | Admitting: Cardiology

## 2018-10-18 ENCOUNTER — Telehealth: Payer: Self-pay | Admitting: *Deleted

## 2018-10-18 NOTE — Telephone Encounter (Signed)
Can the pt go off of the plavix for 4-5 days to get a CT scan on her back, waiting on [facility] to call and schedule. Please advise. Please call [phone] number.

## 2018-10-19 NOTE — Telephone Encounter (Signed)
Yes stop plavix for 5 days

## 2018-10-19 NOTE — Telephone Encounter (Signed)
Called and left message on Kathy Moss phone that pt can stop the Plavix for 5 days according to Dr. Agustin Cree. Left our number if she had any questions.

## 2018-10-24 NOTE — Telephone Encounter (Signed)
Spoke to pam patient's spouse, she reports that she received the message and will hold plavix 5 days before scheduled ct

## 2018-10-27 ENCOUNTER — Other Ambulatory Visit: Payer: Self-pay | Admitting: Allergy

## 2018-11-07 ENCOUNTER — Ambulatory Visit: Payer: Medicare Other | Admitting: Allergy

## 2018-11-21 ENCOUNTER — Ambulatory Visit: Payer: Medicare Other | Admitting: Allergy

## 2018-11-24 ENCOUNTER — Other Ambulatory Visit: Payer: Self-pay | Admitting: Cardiology

## 2018-12-15 ENCOUNTER — Other Ambulatory Visit: Payer: Self-pay

## 2018-12-15 ENCOUNTER — Telehealth: Payer: Self-pay | Admitting: Cardiology

## 2018-12-15 ENCOUNTER — Encounter: Payer: Self-pay | Admitting: Cardiology

## 2018-12-15 ENCOUNTER — Telehealth (INDEPENDENT_AMBULATORY_CARE_PROVIDER_SITE_OTHER): Payer: Medicare Other | Admitting: Cardiology

## 2018-12-15 VITALS — BP 127/87 | HR 61 | Wt 197.0 lb

## 2018-12-15 DIAGNOSIS — R072 Precordial pain: Secondary | ICD-10-CM | POA: Diagnosis not present

## 2018-12-15 DIAGNOSIS — F17200 Nicotine dependence, unspecified, uncomplicated: Secondary | ICD-10-CM

## 2018-12-15 DIAGNOSIS — E119 Type 2 diabetes mellitus without complications: Secondary | ICD-10-CM

## 2018-12-15 DIAGNOSIS — E7800 Pure hypercholesterolemia, unspecified: Secondary | ICD-10-CM

## 2018-12-15 MED ORDER — OLMESARTAN MEDOXOMIL 20 MG PO TABS: 20.0000 mg | ORAL_TABLET | Freq: Every day | ORAL | 1 refills | Status: DC

## 2018-12-15 MED ORDER — LOSARTAN POTASSIUM 50 MG PO TABS: 50.0000 mg | ORAL_TABLET | Freq: Every day | ORAL | 1 refills | Status: DC

## 2018-12-15 MED ORDER — AMLODIPINE BESYLATE 5 MG PO TABS: 5.0000 mg | ORAL_TABLET | Freq: Every day | ORAL | 1 refills | Status: DC

## 2018-12-15 MED ORDER — ISOSORBIDE MONONITRATE ER 30 MG PO TB24: 30.0000 mg | ORAL_TABLET | Freq: Every day | ORAL | 1 refills | Status: DC

## 2018-12-15 MED ORDER — RANOLAZINE ER 500 MG PO TB12: 500.0000 mg | ORAL_TABLET | Freq: Two times a day (BID) | ORAL | 1 refills | Status: DC

## 2018-12-15 MED ORDER — CLOPIDOGREL BISULFATE 75 MG PO TABS: 75.0000 mg | ORAL_TABLET | Freq: Every day | ORAL | 1 refills | Status: DC

## 2018-12-15 NOTE — Telephone Encounter (Signed)
Virtual Visit Pre-Appointment Phone Call  Steps For Call:  1. Confirm consent - "In the setting of the current Covid19 crisis, you are scheduled for a (phone or video) visit with your provider on (date) at (time).  Just as we do with many in-office visits, in order for you to participate in this visit, we must obtain consent.  If you'd like, I can send this to your mychart (if signed up) or email for you to review.  Otherwise, I can obtain your verbal consent now.  All virtual visits are billed to your insurance company just like a normal visit would be.  By agreeing to a virtual visit, we'd like you to understand that the technology does not allow for your provider to perform an examination, and thus may limit your provider's ability to fully assess your condition. If your provider identifies any concerns that need to be evaluated in person, we will make arrangements to do so.  Finally, though the technology is pretty good, we cannot assure that it will always work on either your or our end, and in the setting of a video visit, we may have to convert it to a phone-only visit.  In either situation, we cannot ensure that we have a secure connection.  Are you willing to proceed?" STAFF: Did the patient verbally acknowledge consent to telehealth visit? Document YES/NO here: YES  2. Confirm the BEST phone number to call the day of the visit by including in appointment notes  3. Give patient instructions for WebEx/MyChart download to smartphone as below or Doximity/Doxy.me if video visit (depending on what platform provider is using)  4. Advise patient to be prepared with their blood pressure, heart rate, weight, any heart rhythm information, their current medicines, and a piece of paper and pen handy for any instructions they may receive the day of their visit  5. Inform patient they will receive a phone call 15 minutes prior to their appointment time (may be from unknown caller ID) so they should be  prepared to answer  6. Confirm that appointment type is correct in Epic appointment notes (VIDEO vs PHONE)     TELEPHONE CALL NOTE  Kathy Moss has been deemed a candidate for a follow-up tele-health visit to limit community exposure during the Covid-19 pandemic. I spoke with the patient via phone to ensure availability of phone/video source, confirm preferred email & phone number, and discuss instructions and expectations.  I reminded Kathy Moss to be prepared with any vital sign and/or heart rhythm information that could potentially be obtained via home monitoring, at the time of her visit. I reminded Kathy Moss to expect a phone call at the time of her visit if her visit.  [name] 12/15/2018 2:30 PM   INSTRUCTIONS FOR DOWNLOADING THE [place] APP TO SMARTPHONE  - If Apple, ask patient to go to [employer] and type in WebEx in the search bar. [place] [employer], the blue/green circle. If Android, go to [employer] and type in [employer] in the search bar. The app is free but as with any other app downloads, their phone may require them to verify saved payment information or Apple/Android password.  - The patient does NOT have to create an account. - On the day of the visit, the assist will walk the patient through joining the meeting with the meeting number/password.  INSTRUCTIONS FOR DOWNLOADING THE MYCHART APP TO SMARTPHONE  - The patient must first make  sure to have activated MyChart and know their login information - If Apple, go to [employer] and type in MyChart in the search bar and download the app. If Android, ask patient to go to [employer] and type in [place] in the search bar and download the app. The app is free but as with any other app downloads, their phone may require them to verify saved payment information or Apple/Android password.  - The patient will need to then log into the app with their MyChart username and password, and select  [facility] as their healthcare provider to link the account. When it is time for your visit, go to the MyChart app, find appointments, and click Begin Video Visit. Be sure to Select Allow for your device to access the Microphone and Camera for your visit. You will then be connected, and your provider will be with you shortly.  **If they have any issues connecting, or need assistance please contact MyChart service desk [phone])**  **If using a computer, in order to ensure the best quality for their visit they will need to use either of the following Internet Browsers: [employer], or Google Chrome**  IF USING DOXIMITY or DOXY.ME - The patient will receive a link just prior to their visit, either by text or email (to be determined day of appointment depending on if it's doxy.me or Doximity).     FULL LENGTH CONSENT FOR TELE-HEALTH VISIT   I hereby voluntarily request, consent and authorize [place] and its employed or contracted physicians, physician assistants, nurse practitioners or other licensed health care professionals (the Practitioner), to provide me with telemedicine health care services (the Services") as deemed necessary by the treating Practitioner. I acknowledge and consent to receive the Services by the Practitioner via telemedicine. I understand that the telemedicine visit will involve communicating with the Practitioner through live audiovisual communication technology and the disclosure of certain medical information by electronic transmission. I acknowledge that I have been given the opportunity to request an in-person assessment or other available alternative prior to the telemedicine visit and am voluntarily participating in the telemedicine visit.  I understand that I have the right to withhold or withdraw my consent to the use of telemedicine in the course of my care at any time, without affecting my right to future care or treatment, and that the  Practitioner or I may terminate the telemedicine visit at any time. I understand that I have the right to inspect all information obtained and/or recorded in the course of the telemedicine visit and may receive copies of available information for a reasonable fee.  I understand that some of the potential risks of receiving the Services via telemedicine include:   Delay or interruption in medical evaluation due to technological equipment failure or disruption;  Information transmitted may not be sufficient (e.g. poor resolution of images) to allow for appropriate medical decision making by the Practitioner; and/or   In rare instances, security protocols could fail, causing a breach of personal health information.  Furthermore, I acknowledge that it is my responsibility to provide information about my medical history, conditions and care that is complete and accurate to the best of my ability. I acknowledge that Practitioner's advice, recommendations, and/or decision may be based on factors not within their control, such as incomplete or inaccurate data provided by me or distortions of diagnostic images or specimens that may result from electronic transmissions. I understand that the practice of medicine is not an exact  science and that Practitioner makes no warranties or guarantees regarding treatment outcomes. I acknowledge that I will receive a copy of this consent concurrently upon execution via email to the email address I last provided but may also request a printed copy by calling the office of [place].    I understand that my insurance will be billed for this visit.   I have read or had this consent read to me.  I understand the contents of this consent, which adequately explains the benefits and risks of the Services being provided via telemedicine.   I have been provided ample opportunity to ask questions regarding this consent and the Services and have had my questions answered to my  satisfaction.  I give my informed consent for the services to be provided through the use of telemedicine in my medical care  By participating in this telemedicine visit I agree to the above.     Cardiac Questionnaire:    Since your last visit or hospitalization:    1. Have you been having new or worsening chest pain? no   2. Have you been having new or worsening shortness of breath? no 3. Have you been having new or worsening leg swelling, wt gain, or increase in abdominal girth (pants fitting more tightly)? no   4. Have you had any passing out spells? no    *A YES to any of these questions would result in the appointment being kept. *If all the answers to these questions are NO, we should indicate that given the current situation regarding the worldwide coronarvirus pandemic, at the recommendation of the CDC, we are looking to limit gatherings in our waiting area, and thus will reschedule their appointment beyond four weeks from today.   _____________   [id number] Pre-Screening Questions:   Do you currently have a fever? no (yes = cancel and refer to pcp for e-visit)  Have you recently travelled on a cruise, internationally, or to [place], [place], [place], [place], [place], or [place], [place]) ? no (yes = cancel, stay home, monitor symptoms, and contact pcp or initiate e-visit if symptoms develop)  Have you been in contact with someone that is currently pending confirmation of Covid19 testing or has been confirmed to have the [place] virus?  no (yes = cancel, stay home, away from tested individual, monitor symptoms, and contact pcp or initiate e-visit if symptoms develop)  Are you currently experiencing fatigue or cough? no (yes = pt should be prepared to have a mask placed at the time of their visit).

## 2018-12-15 NOTE — Progress Notes (Signed)
Virtual Visit via Video Note   This visit type was conducted due to national recommendations for restrictions regarding the COVID-19 Pandemic (e.g. social distancing) in an effort to limit this patient's exposure and mitigate transmission in our community.  Due to her co-morbid illnesses, this patient is at least at moderate risk for complications without adequate follow up.  This format is felt to be most appropriate for this patient at this time.  All issues noted in this document were discussed and addressed.  A limited physical exam was performed with this format.  Please refer to the patient's chart for her consent to telehealth for [facility].  Evaluation Performed:  Follow-up visit  This visit type was conducted due to national recommendations for restrictions regarding the COVID-19 Pandemic (e.g. social distancing).  This format is felt to be most appropriate for this patient at this time.  All issues noted in this document were discussed and addressed.  No physical exam was performed (except for noted visual exam findings with Video Visits).  Please refer to the patient's chart (MyChart message for video visits and phone note for telephone visits) for the patient's consent to telehealth for [facility].  Date:  12/15/2018  ID: Kathy Moss, DOB 07-Oct-1960, MRN [id number]   Patient Location: [place] [address]   Provider location:   [place] Office  PCP:  Raina Mina., MD  Cardiologist:  Jenne Campus, MD     Chief Complaint: I am doing well  History of Present Illness:    Kathy Moss is a 58 y.o. female  who presents via audio/video conferencing for a telehealth visit today.  With history of COPD, continued smoker, dyslipidemia, atypical chest pain.  I been following for 4 years.  Her wife Kathy Moss recently was diagnosed with melanoma of her right eye.  In March I was removed.  Obviously they are very shaken by that and and they  really helping each daughter the best they can.  Overall she like tells me that she is doing well denies having any chest pain tightness squeezing pressure burning chest I think she is downgrading her symptoms right now being worried more about her wiping herself.   The patient does not have symptoms concerning for COVID-19 infection (fever, chills, cough, or new SHORTNESS OF BREATH).    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[place])    Hypertension    Recurrent upper respiratory infection (URI)    Seizures ([place])    Type 2 diabetes mellitus without complication, without long-term current use of insulin ([place]) 06/30/2016    Past Surgical History:  Procedure Laterality Date   APPENDECTOMY     BACK SURGERY     CHOLECYSTECTOMY     HERNIA REPAIR     LEFT HEART CATH AND CORONARY ANGIOGRAPHY N/A 03/16/2017   Procedure: Left Heart Cath and Coronary Angiography;  Surgeon: Jettie Booze, MD;  Location: [place] [facility];  Service: Cardiovascular;  Laterality: N/A;     Current Meds  Medication Sig   albuterol (PROVENTIL HFA;VENTOLIN HFA) 108 (90 Base) MCG/ACT inhaler Inhale 1-2 puffs into the lungs every 6 (six) hours as needed for wheezing or shortness of breath.   allopurinol (ZYLOPRIM) 100 MG tablet    ALPRAZolam Duanne Moron)  0.5 MG tablet Take 0.5 mg by mouth every 8 (eight) hours as needed for anxiety.    amLODipine (NORVASC) 5 MG tablet Take 1 tablet (5 mg total) by mouth daily.   aspirin EC 81 MG tablet Take 81 mg by mouth daily.   azelastine (ASTELIN) 0.1 % nasal spray Place 2 sprays into both nostrils 2 (two) times daily. Use in each nostril as directed    butalbital-acetaminophen-caffeine (FIORICET, ESGIC) 50-325-40 MG tablet TAKE 1-2 TABLET(S) BY MOUTH EVERY 4 HOURS AS NEEDED FOR PAIN.   cetirizine (ZYRTEC)  10 MG tablet Take one tablet once daily   clopidogrel (PLAVIX) 75 MG tablet TAKE 1 TABLET BY MOUTH ONCE DAILY   Cyanocobalamin (VITAMIN B-12 IJ) Inject as directed every 30 (thirty) days.   dicyclomine (BENTYL) 10 MG capsule    esomeprazole (NEXIUM) 40 MG capsule Take 40 mg by mouth 2 (two) times daily before a meal.    Eszopiclone (ESZOPICLONE) 3 MG TABS Take 3 mg by mouth at bedtime. Take immediately before bedtime   fluticasone (FLONASE) 50 MCG/ACT nasal spray Place 1 spray into both nostrils daily.   fluticasone furoate-vilanterol (BREO ELLIPTA) 200-25 MCG/INH AEPB Inhale 1 puff into the lungs daily.    gabapentin (NEURONTIN) 300 MG capsule Take 600 mg by mouth 3 (three) times daily.    Immune Globulin-Hyaluronidase (HYQVIA [place]) Inject into the skin.   insulin glargine (LANTUS) 100 UNIT/ML injection Inject into the skin at bedtime as needed (high blood sugar).   insulin lispro (HUMALOG) 100 UNIT/ML injection Inject 10 Units into the skin 3 (three) times daily as needed for high blood sugar (for high blood sugar greather than 150).   ipratropium (ATROVENT) 0.06 % nasal spray Use two sprays in each nostril 3-4 times daily if needed for nasal drainage/congestion.   isosorbide mononitrate (IMDUR) 30 MG 24 hr tablet TAKE 1 TABLET BY MOUTH DAILY   lacosamide (VIMPAT) 200 MG TABS tablet Take 200 mg by mouth 2 (two) times daily.   lamoTRIgine (LAMICTAL) 25 MG tablet Take 2 tablets by mouth 2 (two) times daily.   lidocaine-prilocaine (EMLA) cream    losartan (COZAAR) 50 MG tablet    montelukast (SINGULAIR) 10 MG tablet TAKE 1 TABLET BY MOUTH ONCE DAILY.   nitroGLYCERIN (NITROSTAT) 0.4 MG SL tablet TAKE 1 TABLET UNDER THE TONGUE AS NEEDEDFOR CHEST PAIN ( MAY REPEAT EVERY 5 MINUTES X 3)   nystatin (MYCOSTATIN) 100000 UNIT/ML suspension Take 5 mLs by mouth 3 (three) times daily as needed (FOR THRUSH).    olmesartan (BENICAR) 20 MG tablet Take 20 mg by mouth daily.   Omega-3 Fatty  Acids (FISH OIL PO) Take 2,000 mg by mouth 2 (two) times daily.    oxyCODONE-acetaminophen (PERCOCET) 10-325 MG tablet One tablet every 4 hours as needed for pain.   PAZEO 0.7 % SOLN Place 1 drop into both eyes daily as needed.   promethazine (PHENERGAN) 25 MG tablet Take 25 mg by mouth every 6 (six) hours as needed for nausea or vomiting.   ranitidine (ZANTAC) 300 MG tablet Take 300 mg by mouth at bedtime.   ranolazine (RANEXA) 500 MG 12 hr tablet TAKE 1 TABLET BY MOUTH TWICE DAILY.   rOPINIRole (REQUIP) 4 MG tablet    rosuvastatin (CRESTOR) 5 MG tablet TAKE 1 TABLET BY MOUTH ONCE DAILY   tiotropium (SPIRIVA) 18 MCG inhalation capsule Place 18 mcg into inhaler and inhale daily.   tiZANidine (ZANAFLEX) 4 MG capsule Take 4 mg by mouth 3 (three) times  daily.   Vitamin D, Cholecalciferol, 1000 units TABS Take 3 tablets by mouth daily.   Vitamin D, Ergocalciferol, (DRISDOL) 50000 units CAPS capsule Take 50,000 Units by mouth once a week.       Family History: The patient's family history includes Allergic rhinitis in her brother; Asthma in her mother; COPD in her brother; Heart attack in her father; High blood pressure in her mother; Lung cancer in her mother; Seizures in her mother; Stroke in her mother.   ROS:   Please see the history of present illness.     All other systems reviewed and are negative.   Labs/Other Tests and Data Reviewed:     Recent Labs: No results found for requested labs within last 8760 hours.  Recent Lipid Panel No results found for: CHOL, TRIG, HDL, CHOLHDL, VLDL, LDLCALC, LDLDIRECT    Exam:    Vital Signs:  BP 127/87    Pulse 61    Wt 197 lb (89.4 kg)    BMI 32.78 kg/m     Wt Readings from Last 3 Encounters:  12/15/18 197 lb (89.4 kg)  09/07/18 189 lb 3.2 oz (85.8 kg)  03/28/18 192 lb (87.1 kg)     Well nourished, well developed in no acute distress. Alert awake and attentive x3.  I talked to her from the kitchen.  She is there with her  as well as her wife.  There is no JVD no swelling of lower extremities.  Diagnosis for this visit:   1. Precordial pain   2. Smoking   3. Pure hypercholesterolemia   4. Type 2 diabetes mellitus without complication, without long-term current use of insulin ([place])      ASSESSMENT & PLAN:    1.  Precordial chest pain.  Denies having any. 2.  Smoking still continue strongly advised her to quit. 3.  Dyslipidemia which I will continue. 4.  Type 2 diabetes doing well from that point review  COVID-19 Education: The signs and symptoms of COVID-19 were discussed with the patient and how to seek care for testing (follow up with PCP or arrange E-visit).  The importance of social distancing was discussed today.  Patient Risk:   After full review of this patients clinical status, I feel that they are at least moderate risk at this time.  Time:   Today, I have spent 18 minutes with the patient with telehealth technology discussing pt health issues.  I spent 5 minutes reviewing her chart before the visit.  Visit was finished at 3:24 PM.    Medication Adjustments/Labs and Tests Ordered: Current medicines are reviewed at length with the patient today.  Concerns regarding medicines are outlined above.  No orders of the defined types were placed in this encounter.  Medication changes: No orders of the defined types were placed in this encounter.    Disposition: Follow-up 3 months  Signed, Park Liter, MD, [facility] 12/15/2018 3:26 PM    [place]

## 2018-12-15 NOTE — Patient Instructions (Signed)
Medication Instructions:  Your physician recommends that you continue on your current medications as directed. Please refer to the Current Medication list given to you today.  If you need a refill on your cardiac medications before your next appointment, please call your pharmacy.   Lab work: None.  If you have labs (blood work) drawn today and your tests are completely normal, you will receive your results only by: . MyChart Message (if you have MyChart) OR . A paper copy in the mail If you have any lab test that is abnormal or we need to change your treatment, we will call you to review the results.  Testing/Procedures: None.   Follow-Up: At [facility], you and your health needs are our priority.  As part of our continuing mission to provide you with exceptional heart care, we have created designated Provider Care Teams.  These Care Teams include your primary Cardiologist (physician) and Advanced Practice Providers (APPs -  Physician Assistants and Nurse Practitioners) who all work together to provide you with the care you need, when you need it. You will need a follow up appointment in 3 months.  Please call our office 2 months in advance to schedule this appointment.  You may see No primary care provider on file. or another member of our [facility] Provider Team in [place]: Brian Munley, MD . Rajan Revankar, MD  Any Other Special Instructions Will Be Listed Below (If Applicable).     

## 2018-12-26 ENCOUNTER — Ambulatory Visit (INDEPENDENT_AMBULATORY_CARE_PROVIDER_SITE_OTHER): Payer: Medicare Other | Admitting: Allergy

## 2018-12-26 ENCOUNTER — Other Ambulatory Visit: Payer: Self-pay

## 2018-12-26 ENCOUNTER — Encounter: Payer: Self-pay | Admitting: Allergy

## 2018-12-26 DIAGNOSIS — D839 Common variable immunodeficiency, unspecified: Secondary | ICD-10-CM | POA: Diagnosis not present

## 2018-12-26 DIAGNOSIS — H1010 Acute atopic conjunctivitis, unspecified eye: Secondary | ICD-10-CM

## 2018-12-26 DIAGNOSIS — J309 Allergic rhinitis, unspecified: Secondary | ICD-10-CM

## 2018-12-26 DIAGNOSIS — J449 Chronic obstructive pulmonary disease, unspecified: Secondary | ICD-10-CM

## 2018-12-26 NOTE — Patient Instructions (Addendum)
Common Variable Immunodeficiency (CVID)    - transitioned from IVIG to subcutaneous immunoglobulin (Hizentra).      - Continue Hizentra infusions weekly.  Alternate injection sites over abdomen.      - will check IgG levels periodically (next check in early fall) to ensure adequate dose of Hizentra    - advised with coronavirus still a concern with her compromised immune systemt she should stay home as much as possible  Allergies  - use nasal atrovent 2 sprays each nostril up to 3-4 times a day as needed for nasal congestion/drainage  - use Zyrtec 10mg  daily as needed  - continue singulair 10mg  daily   - for itchy/watery/red eyes use Pataday or Pazeo 1 drop each eye daily as needed  COPD/Asthma   - continue your current medications as per Dr. Carren Rang at this time-- Breo, spiriva and as needed albuterol    - continue singulair for additional control    Follow-up in 6 months or sooner if needed.

## 2018-12-26 NOTE — Progress Notes (Signed)
RE: Kathy Moss MRN: [id number] DOB: 10-02-1960 Date of Telemedicine Visit: 12/26/2018  Referring provider: Raina Mina., MD Primary care provider: Mayer Camel, NP  Chief Complaint: No chief complaint on file.   Telemedicine Follow Up Visit via Telephone: I connected with Kathy Moss for a follow up on 12/26/18 by telephone and verified that I am speaking with the correct person using two identifiers.   I discussed the limitations, risks, security and privacy concerns of performing an evaluation and management service by telephone and the availability of in person appointments. I also discussed with the patient that there may be a patient responsible charge related to this service. The patient expressed understanding and agreed to proceed.  Patient is at home. Provider is at the office.  Visit start time: 1544 Visit end time: [place] consent/check in by: Lupita Raider Medical consent and medical assistant/nurse: Girtha Rm  History of Present Illness: She is a 58 y.o. female, who is being followed for CVID, ashtma with COPD and allergi rhinoconjunctivitis. Her previous allergy office visit was on 05/09/18 with Dr. Nelva Bush.  She states she fell recently and fractured her spine and had to go to her spine surgeon about 2 weeks ago and states she really didn't want to as she didn't want to expose herself.  However she did go to the doctor's office and states a week later she developed sore throat and cough without SOB(this was last week).  She does endorse having temp up to 100 and taking tylenol.  She states she did not see anyone for these symptoms as she was nervous to go back out to doctor's office.  She states she took all her normal medications and let her body fight it off and she is feeling much better this week.  She did states that her spiriva handihaler which changed to respimat which she is liking much better.  We did try her at every other week of Hizentra  injections but the volume was just too much and was causing her pain and injection site thus changed back to weekly and she has been doing well ever since.  Her partner provides the injections and rotates over her abdomen and Kathy Moss states that her partner is doing very well giving her injections.   She has not had any antibiotic needs that she can recall since her last visit in September! She does take her sinulair and zyrtec and using nasal atrovent or azelastine as she need to for nasal congestion or drainage respectively.  She continues on with spiriva as above and Breo and following with Dr. Carren Rang for her asthma/COPD.    Assessment and Plan: Kathy Moss is a 58 y.o. female with:   Common Variable Immunodeficiency (CVID)    - has not had any antibiotic needs in 7 months    - transitioned from IVIG to subcutaneous immunoglobulin (Hizentra).      - Continue Hizentra infusions weekly.  Alternate injection sites over abdomen.      - will check IgG levels periodically (next check in early fall) to ensure adequate dose of Hizentra    - advised with coronavirus still a concern with her compromised immune systemt she should stay home as much as possible  Allergic rhinoconjunctivitis  - use nasal atrovent 2 sprays each nostril up to 3-4 times a day as needed for nasal congestion/drainage  - use Zyrtec 10mg  daily as needed  - continue singulair 10mg  daily   - for itchy/watery/red eyes use Pataday  or Pazeo 1 drop each eye daily as needed  COPD/Asthma   - continue your current medications as per Dr. Carren Rang at this time-- Breo, spiriva and as needed albuterol    - continue singulair for additional control    Follow-up in 6 months or sooner if needed.      Diagnostics: None.  Medication List:  Current Outpatient Medications  Medication Sig Dispense Refill  . albuterol (PROVENTIL HFA;VENTOLIN HFA) 108 (90 Base) MCG/ACT inhaler Inhale 1-2 puffs into the lungs every 6 (six) hours as needed for  wheezing or shortness of breath.    . allopurinol (ZYLOPRIM) 100 MG tablet     . ALPRAZolam (XANAX) 0.5 MG tablet Take 0.5 mg by mouth every 8 (eight) hours as needed for anxiety.     . AMITIZA 8 MCG capsule     . amLODipine (NORVASC) 5 MG tablet Take 1 tablet (5 mg total) by mouth daily. 90 tablet 1  . aspirin EC 81 MG tablet Take 81 mg by mouth daily.    Marland Kitchen azelastine (ASTELIN) 0.1 % nasal spray Place 2 sprays into both nostrils 2 (two) times daily. Use in each nostril as directed     . butalbital-acetaminophen-caffeine (FIORICET, ESGIC) 50-325-40 MG tablet TAKE 1-2 TABLET(S) BY MOUTH EVERY 4 HOURS AS NEEDED FOR PAIN. 16 tablet 0  . cetirizine (ZYRTEC) 10 MG tablet Take one tablet once daily 30 tablet 5  . clopidogrel (PLAVIX) 75 MG tablet Take 1 tablet (75 mg total) by mouth daily. 90 tablet 1  . Cyanocobalamin (VITAMIN B-12 IJ) Inject as directed every 30 (thirty) days.    Marland Kitchen dicyclomine (BENTYL) 10 MG capsule     . esomeprazole (NEXIUM) 40 MG capsule Take 40 mg by mouth 2 (two) times daily before a meal.     . fluticasone (FLONASE) 50 MCG/ACT nasal spray Place 1 spray into both nostrils daily.    . fluticasone furoate-vilanterol (BREO ELLIPTA) 200-25 MCG/INH AEPB Inhale 1 puff into the lungs daily.     Marland Kitchen gabapentin (NEURONTIN) 300 MG capsule Take 600 mg by mouth 3 (three) times daily.     . Immune Globulin, Human, (HIZENTRA [place]) Inject into the skin once a week.    . insulin glargine (LANTUS) 100 UNIT/ML injection Inject into the skin at bedtime as needed (high blood sugar).    . insulin lispro (HUMALOG) 100 UNIT/ML injection Inject 10 Units into the skin 3 (three) times daily as needed for high blood sugar (for high blood sugar greather than 150).    Marland Kitchen ipratropium (ATROVENT) 0.06 % nasal spray Use two sprays in each nostril 3-4 times daily if needed for nasal drainage/congestion. 15 mL 5  . ipratropium-albuterol (DUONEB) 0.5-2.5 (3) MG/3ML SOLN     . isosorbide mononitrate (IMDUR) 30 MG 24 hr  tablet Take 1 tablet (30 mg total) by mouth daily. 90 tablet 1  . lacosamide (VIMPAT) 200 MG TABS tablet Take 200 mg by mouth 2 (two) times daily.    Marland Kitchen lamoTRIgine (LAMICTAL) 25 MG tablet Take 2 tablets by mouth 2 (two) times daily.    Marland Kitchen lidocaine-prilocaine (EMLA) cream     . montelukast (SINGULAIR) 10 MG tablet TAKE 1 TABLET BY MOUTH ONCE DAILY. 30 tablet 2  . nitroGLYCERIN (NITROSTAT) 0.4 MG SL tablet TAKE 1 TABLET UNDER THE TONGUE AS NEEDEDFOR CHEST PAIN ( MAY REPEAT EVERY 5 MINUTES X 3) 30 tablet 5  . nystatin (MYCOSTATIN) 100000 UNIT/ML suspension Take 5 mLs by mouth 3 (three) times daily  as needed (FOR THRUSH).     Marland Kitchen olmesartan (BENICAR) 20 MG tablet Take 1 tablet (20 mg total) by mouth daily. 90 tablet 1  . Omega-3 Fatty Acids (FISH OIL PO) Take 2,000 mg by mouth 2 (two) times daily.     . ondansetron (ZOFRAN) 8 MG tablet     . oxyCODONE-acetaminophen (PERCOCET) 10-325 MG tablet One tablet every 4 hours as needed for pain.    Marland Kitchen PAZEO 0.7 % SOLN Place 1 drop into both eyes daily as needed. 2.5 mL 5  . ranolazine (RANEXA) 500 MG 12 hr tablet Take 1 tablet (500 mg total) by mouth 2 (two) times daily. 180 tablet 1  . rOPINIRole (REQUIP) 3 MG tablet     . rosuvastatin (CRESTOR) 5 MG tablet TAKE 1 TABLET BY MOUTH ONCE DAILY 90 tablet 3  . SPIRIVA RESPIMAT 2.5 MCG/ACT AERS     . Vitamin D, Cholecalciferol, 1000 units TABS Take 3 tablets by mouth daily.    . Eszopiclone (ESZOPICLONE) 3 MG TABS Take 3 mg by mouth at bedtime. Take immediately before bedtime    . lidocaine (XYLOCAINE) 5 % ointment Apply 1-2 grams (1 gram = 1 inch) to the affected area 2-3 times daily for 30 days.    Marland Kitchen losartan (COZAAR) 50 MG tablet Take 1 tablet (50 mg total) by mouth daily. 90 tablet 1  . tiZANidine (ZANAFLEX) 4 MG capsule Take 4 mg by mouth 3 (three) times daily.     No current facility-administered medications for this visit.    Allergies: Allergies  Allergen Reactions  . Carimune [Immune Globulin]  Anaphylaxis  . Adhesive [Tape] Other (See Comments)    TAKES SKIN OFF (paper/silk tape) MEDICAL TAPE CAUSES BRUISES AND TEARS SKIN,  PAPER TAPE IS OK  . Amitriptyline Palpitations  . Imitrex [Sumatriptan] Palpitations  . Nsaids Other (See Comments)    Renal insuff  . Tramadol Palpitations  . Buprenorphine Hcl Itching  . Morphine Itching  . Morphine And Related Itching   I reviewed her past medical history, social history, family history, and environmental history and no significant changes have been reported from previous visit on 05/09/18.  Review of Systems  Constitutional: Positive for fever. Negative for chills.  HENT: Positive for sore throat. Negative for congestion, postnasal drip, rhinorrhea, sinus pressure, sinus pain and sneezing.   Eyes: Negative for photophobia, redness and visual disturbance.  Respiratory: Positive for cough. Negative for chest tightness, shortness of breath and wheezing.   Cardiovascular: Negative for chest pain.  Gastrointestinal: Positive for vomiting. Negative for abdominal pain, constipation, diarrhea and nausea.  Musculoskeletal: Positive for back pain.  Neurological: Negative for headaches.   Objective: Physical Exam Not obtained as encounter was done via telephone.   Previous notes and tests were reviewed.  I discussed the assessment and treatment plan with the patient. The patient was provided an opportunity to ask questions and all were answered. The patient agreed with the plan and demonstrated an understanding of the instructions.   The patient was advised to call back or seek an in-person evaluation if the symptoms worsen or if the condition fails to improve as anticipated.  I provided 34 minutes of non-face-to-face time during this encounter.  It was my pleasure to participate in [place] care today. Please feel free to contact me with any questions or concerns.   Sincerely,   Charmian Muff, MD

## 2018-12-30 ENCOUNTER — Other Ambulatory Visit: Payer: Self-pay | Admitting: Allergy

## 2019-02-12 DIAGNOSIS — G2581 Restless legs syndrome: Secondary | ICD-10-CM

## 2019-02-12 DIAGNOSIS — E782 Mixed hyperlipidemia: Secondary | ICD-10-CM

## 2019-02-12 DIAGNOSIS — R5381 Other malaise: Secondary | ICD-10-CM | POA: Insufficient documentation

## 2019-02-12 DIAGNOSIS — R490 Dysphonia: Secondary | ICD-10-CM

## 2019-02-12 DIAGNOSIS — R5383 Other fatigue: Secondary | ICD-10-CM

## 2019-02-12 HISTORY — DX: Mixed hyperlipidemia: E78.2

## 2019-02-12 HISTORY — DX: Other fatigue: R53.83

## 2019-02-12 HISTORY — DX: Dysphonia: R49.0

## 2019-02-12 HISTORY — DX: Other malaise: R53.81

## 2019-02-12 HISTORY — DX: Restless legs syndrome: G25.81

## 2019-02-27 ENCOUNTER — Telehealth: Payer: Self-pay | Admitting: *Deleted

## 2019-02-27 DIAGNOSIS — D839 Common variable immunodeficiency, unspecified: Secondary | ICD-10-CM

## 2019-02-27 NOTE — Telephone Encounter (Signed)
Talked to [facility] and she advised that patient never got labs done (IgG) nurse never came out.  I spoke to Dr Nelva Bush and she advised ok to go to [facility] and get level drawn. Jeral Pinch of same and order sent

## 2019-03-07 IMAGING — CT CT ANGIO EXTREM UP*R*
2 of 6 series · 15 of 46 positions shown, 17 images · IV contrast (APPLIED)
Comparison: None.

CLINICAL DATA: Status post cardiac catheterization with axis via
the right arm. Pain in the "Lower" right all arm since the
procedure.

EXAM:
CT ANGIOGRAPHY UPPER RIGHT EXTREMITY<Procedure Description>CT
ANGIOGRAPHY UPPER RIGHT EXTREMITY
TECHNIQUE: CT scanning was performed of the right lower extremity from a
position just proximal to the elbow through the hand.
CONTRAST:  100 cc Isovue 370

[Series 6: cor · coronal · 0.52mm/px · 3 of 37 slices shown]
[im 10/37  soft-tissue]
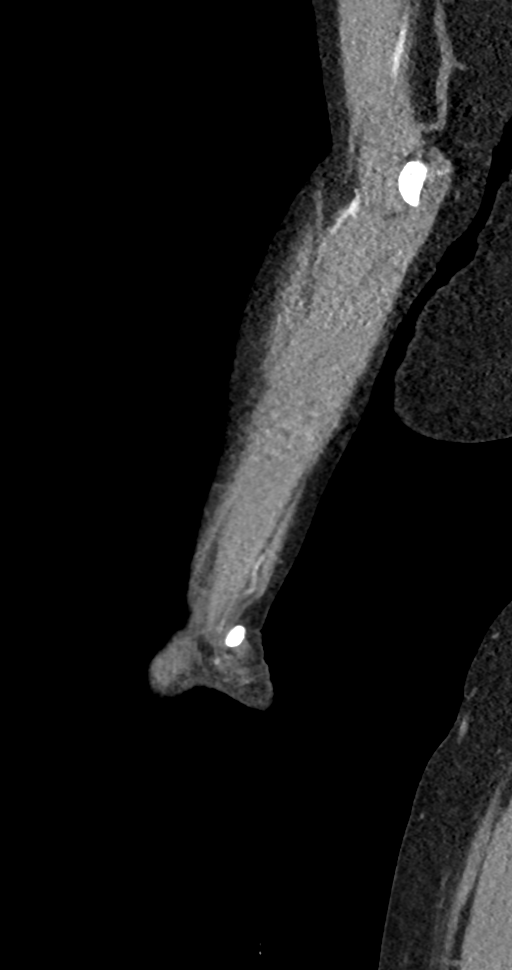
[im 19/37  soft-tissue]
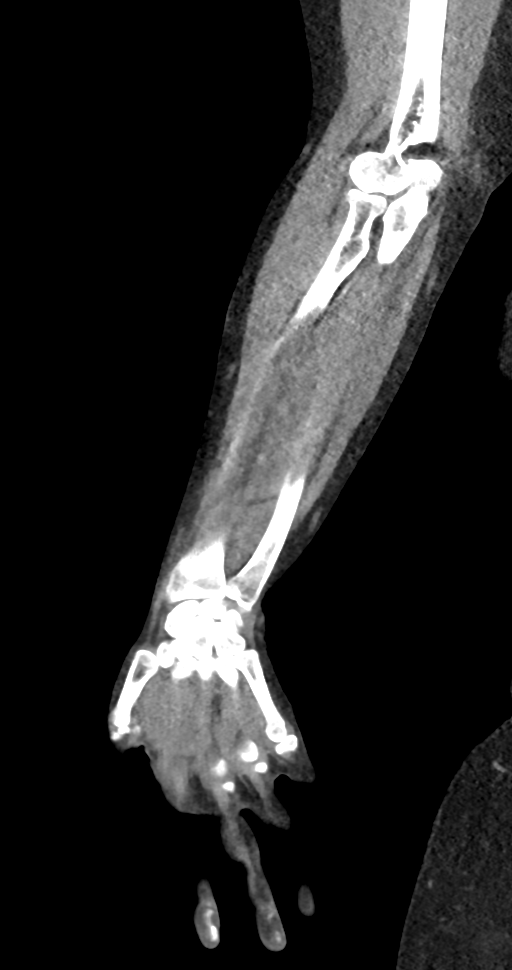
[im 28/37  soft-tissue]
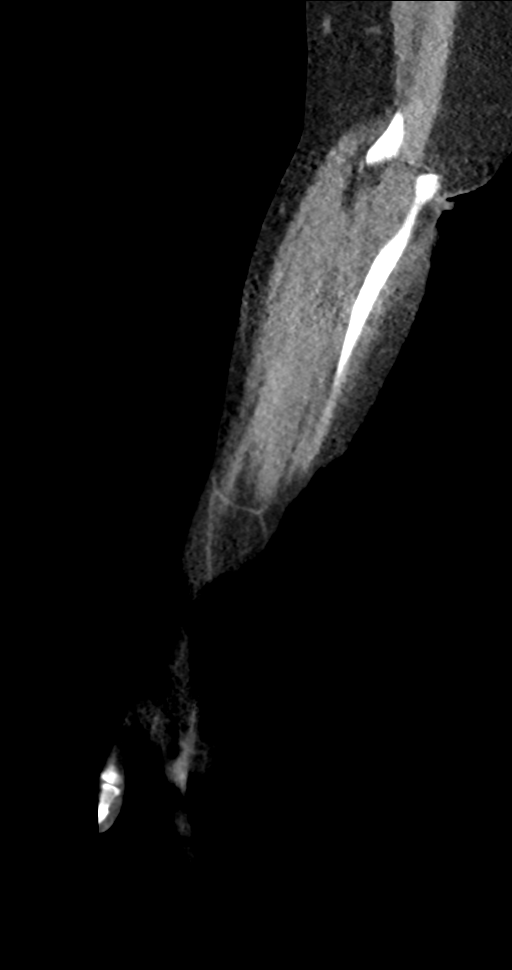

[Series 7: thins · axial · 0.48mm/px · z∈[+644,+1104]mm · 12 of 721 slices shown, 14 images]
[im 32/721  soft-tissue]
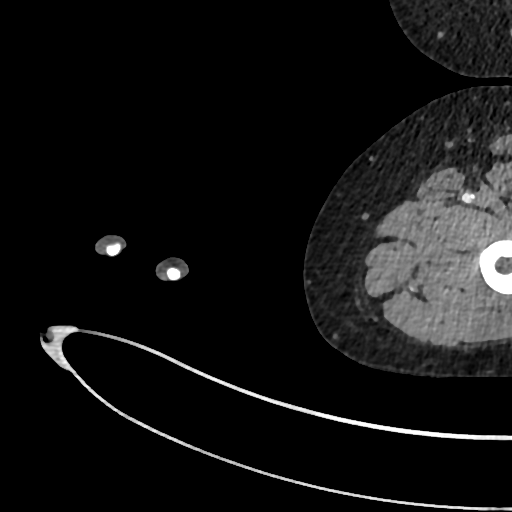
[im 32/721  bone]
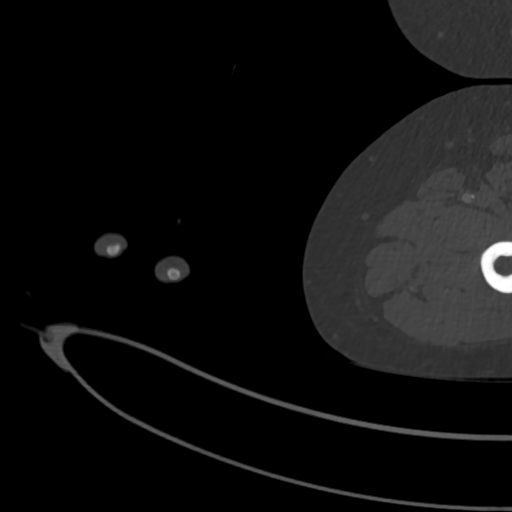
[im 94/721  soft-tissue]
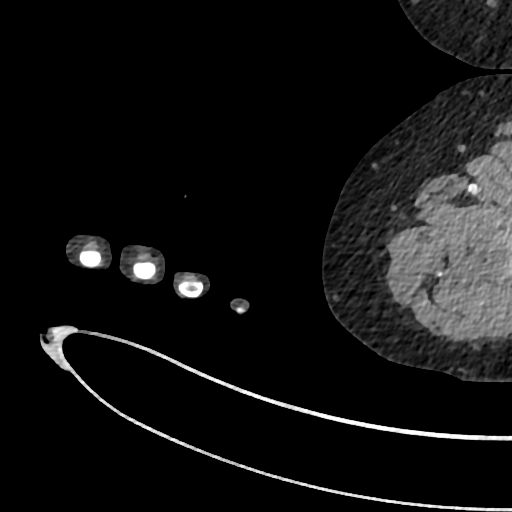
[im 157/721  soft-tissue]
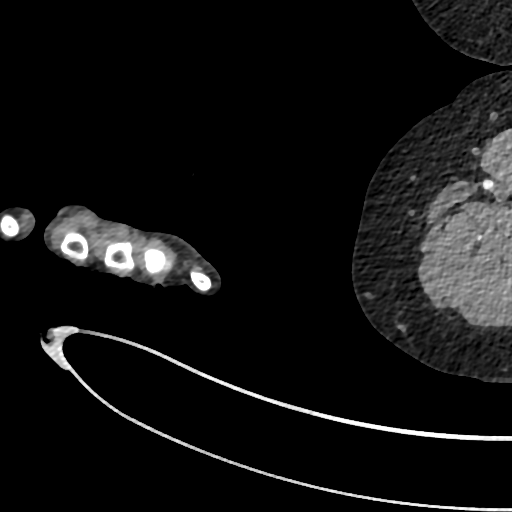
[im 220/721  soft-tissue]
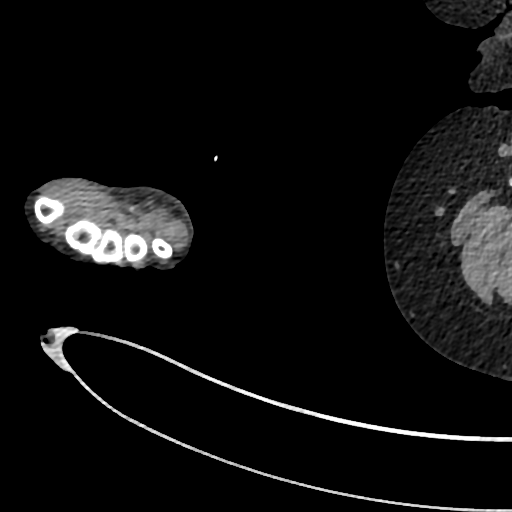
[im 282/721  soft-tissue]
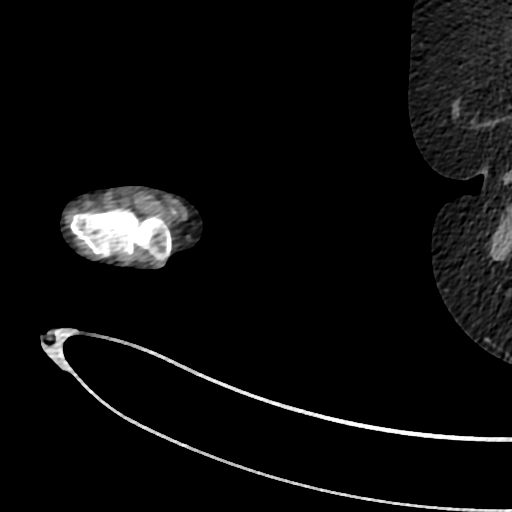
[im 345/721  soft-tissue]
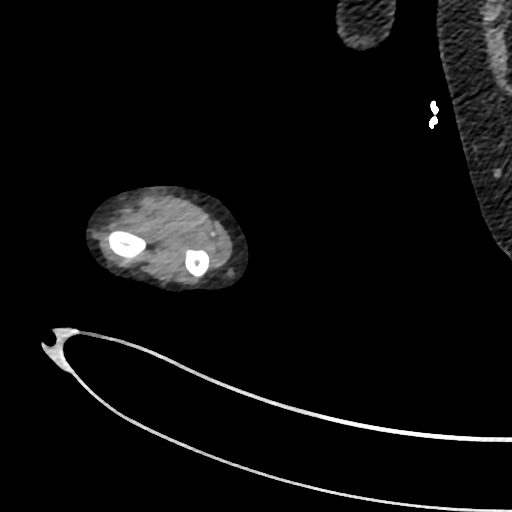
[im 376/721  soft-tissue]
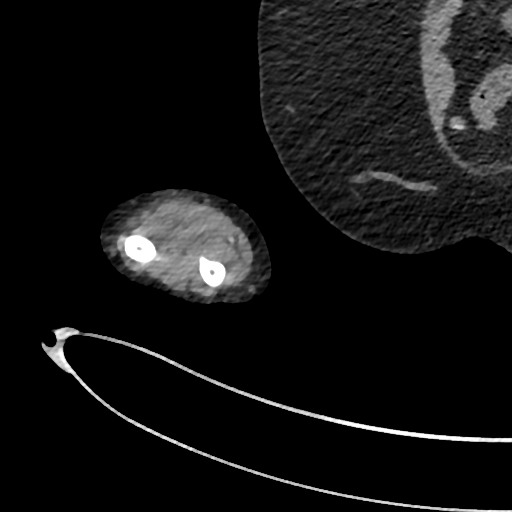
[im 439/721  soft-tissue]
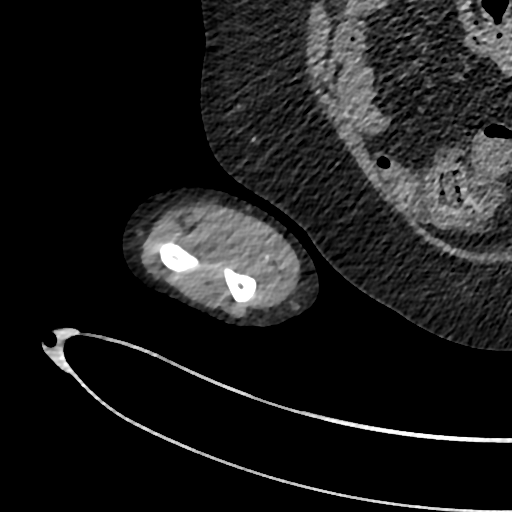
[im 501/721  soft-tissue]
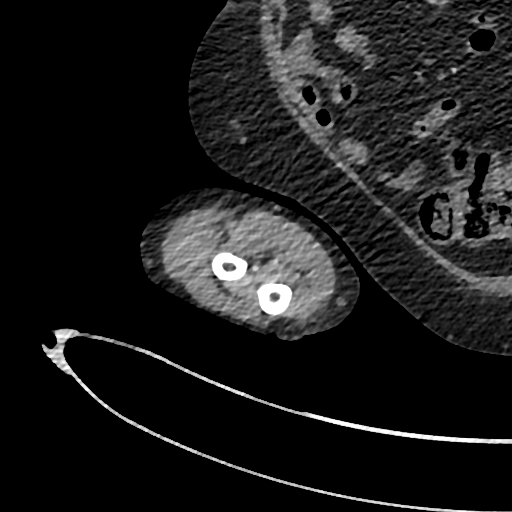
[im 501/721  bone]
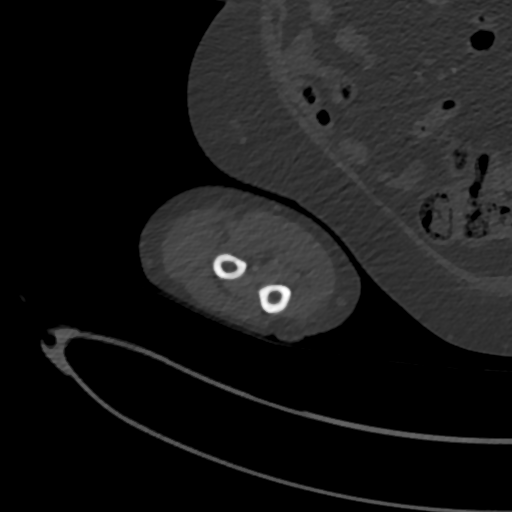
[im 564/721  soft-tissue]
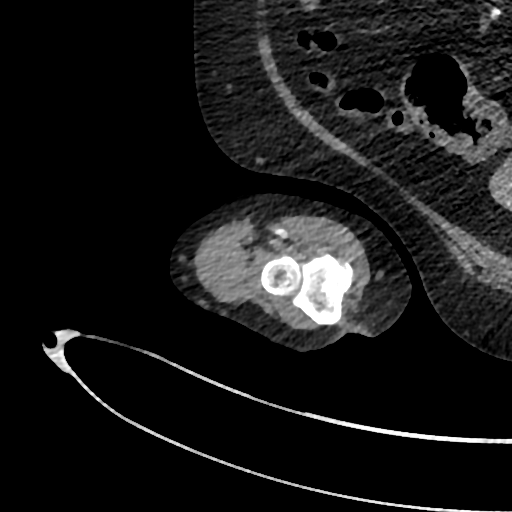
[im 627/721  soft-tissue]
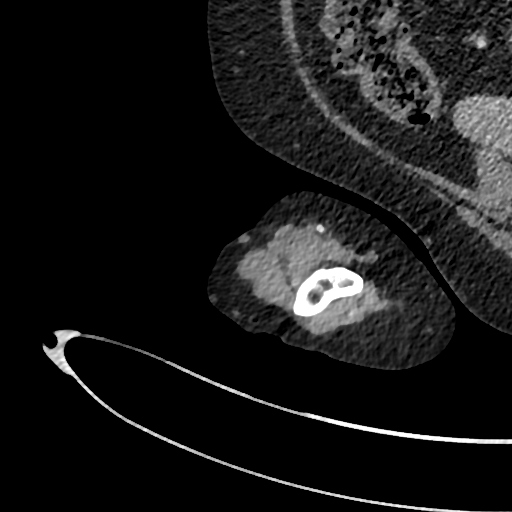
[im 689/721  soft-tissue]
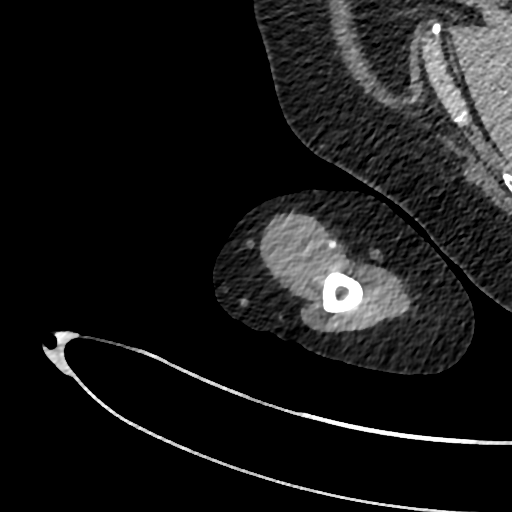

[15 of 46 positions shown; findings below may reference images not displayed]

FINDINGS: The brachial artery is opacified. Bifurcation of the brachial artery
into the ulnar and radial arteries is patent. Ulnar artery is patent
to the level of the wrist.

Radial artery is opacified about 6-7 cm distal to the antecubital
fossa but does not opacify beyond that point. Given the image noise
from scan technique (arm is at the patient's side) fine detail is
obscured.

Review of the MIP images confirms the above findings.
IMPRESSION: Non opacification of the radial artery beginning about 6 cm distal
to the antecubital fossa. This appearance could potentially be
related to significant flow-obstructive spasm, but arterial
thrombus/occlusion must be considered.

## 2019-03-21 ENCOUNTER — Encounter: Payer: Self-pay | Admitting: Cardiology

## 2019-03-21 ENCOUNTER — Telehealth (INDEPENDENT_AMBULATORY_CARE_PROVIDER_SITE_OTHER): Payer: Medicare Other | Admitting: Cardiology

## 2019-03-21 ENCOUNTER — Other Ambulatory Visit: Payer: Self-pay

## 2019-03-21 VITALS — BP 125/82 | Wt 170.0 lb

## 2019-03-21 DIAGNOSIS — J449 Chronic obstructive pulmonary disease, unspecified: Secondary | ICD-10-CM

## 2019-03-21 DIAGNOSIS — E7800 Pure hypercholesterolemia, unspecified: Secondary | ICD-10-CM

## 2019-03-21 DIAGNOSIS — Z8679 Personal history of other diseases of the circulatory system: Secondary | ICD-10-CM | POA: Diagnosis not present

## 2019-03-21 DIAGNOSIS — F17200 Nicotine dependence, unspecified, uncomplicated: Secondary | ICD-10-CM

## 2019-03-21 NOTE — Patient Instructions (Signed)
Medication Instructions:  Your physician recommends that you continue on your current medications as directed. Please refer to the Current Medication list given to you today.  If you need a refill on your cardiac medications before your next appointment, please call your pharmacy.   Lab work:  If you have labs (blood work) drawn today and your tests are completely normal, you will receive your results only by: Marland Kitchen MyChart Message (if you have MyChart) OR . A paper copy in the mail If you have any lab test that is abnormal or we need to change your treatment, we will call you to review the results.  Testing/Procedures: None.   Follow-Up: At [facility], you and your health needs are our priority.  As part of our continuing mission to provide you with exceptional heart care, we have created designated Provider Care Teams.  These Care Teams include your primary Cardiologist (physician) and Advanced Practice Providers (APPs -  Physician Assistants and Nurse Practitioners) who all work together to provide you with the care you need, when you need it. You will need a follow up appointment in 3 months.  Please call our office 2 months in advance to schedule this appointment.  You may see No primary care provider on file. or another member of our [employer] Provider Team in : Shirlee More, MD . Jyl Heinz, MD  Any Other Special Instructions Will Be Listed Below (If Applicable).

## 2019-03-21 NOTE — Progress Notes (Signed)
Virtual Visit via Video Note   This visit type was conducted due to national recommendations for restrictions regarding the COVID-19 Pandemic (e.g. social distancing) in an effort to limit this patient's exposure and mitigate transmission in our community.  Due to her co-morbid illnesses, this patient is at least at moderate risk for complications without adequate follow up.  This format is felt to be most appropriate for this patient at this time.  All issues noted in this document were discussed and addressed.  A limited physical exam was performed with this format.  Please refer to the patient's chart for her consent to telehealth for [facility].  Evaluation Performed:  Follow-up visit  This visit type was conducted due to national recommendations for restrictions regarding the COVID-19 Pandemic (e.g. social distancing).  This format is felt to be most appropriate for this patient at this time.  All issues noted in this document were discussed and addressed.  No physical exam was performed (except for noted visual exam findings with Video Visits).  Please refer to the patient's chart (MyChart message for video visits and phone note for telephone visits) for the patient's consent to telehealth for [facility].  Date:  03/21/2019  ID: Kathy Moss, DOB [birth year], MRN [id number]   Patient Location: [place] [address]   Provider location:   [place] Office  PCP:  Mayer Camel, NP  Cardiologist:  Jenne Campus, MD     Chief Complaint: Doing fine  History of Present Illness:    Kathy Moss is a 58 y.o. female  who presents via audio/video conferencing for a telehealth visit today.  With coronary artery disease, cardiac catheterization done 2 years ago did not show any significant obstructive lesions.  Also peripheral vascular disease, smoking, dyslipidemia, diabetes.  She does have a video visit with me today.  Her wife Kathy Moss  develop iron melanoma and that having surgery done and they were very shaken by that but now they doing quite well and actually I find him today and pretty good spirit.   The patient does not have symptoms concerning for COVID-19 infection (fever, chills, cough, or new SHORTNESS OF BREATH).    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[place])    Hypertension    Recurrent upper respiratory infection (URI)    Seizures ([place])    Type 2 diabetes mellitus without complication, without long-term current use of insulin ([place]) 06/30/2016    Past Surgical History:  Procedure Laterality Date   APPENDECTOMY     BACK SURGERY     CHOLECYSTECTOMY     HERNIA REPAIR     LEFT HEART CATH AND CORONARY ANGIOGRAPHY N/A 03/16/2017   Procedure: Left Heart Cath and Coronary Angiography;  Surgeon: Jettie Booze, MD;  Location: [facility];  Service: Cardiovascular;  Laterality: N/A;     Current Meds  Medication Sig   albuterol (PROVENTIL HFA;VENTOLIN HFA) 108 (90 Base) MCG/ACT inhaler Inhale 1-2 puffs into the lungs every 6 (six) hours as needed for wheezing or shortness of breath.   allopurinol (ZYLOPRIM) 100 MG tablet    ALPRAZolam (XANAX) 0.5 MG tablet Take 0.5 mg by mouth every 8 (eight) hours as needed for anxiety.    AMITIZA 8 MCG capsule  amLODipine (NORVASC) 5 MG tablet Take 1 tablet (5 mg total) by mouth daily.   azelastine (ASTELIN) 0.1 % nasal spray Place 2 sprays into both nostrils 2 (two) times daily. Use in each nostril as directed    butalbital-acetaminophen-caffeine (FIORICET, ESGIC) 50-325-40 MG tablet TAKE 1-2 TABLET(S) BY MOUTH EVERY 4 HOURS AS NEEDED FOR PAIN.   cetirizine (ZYRTEC) 10 MG tablet Take one tablet once daily   clopidogrel (PLAVIX) 75 MG tablet Take 1 tablet (75 mg total) by mouth daily.    Cyanocobalamin (VITAMIN B-12 IJ) Inject as directed every 30 (thirty) days.   dicyclomine (BENTYL) 10 MG capsule    esomeprazole (NEXIUM) 40 MG capsule Take 40 mg by mouth 2 (two) times daily before a meal.    Eszopiclone (ESZOPICLONE) 3 MG TABS Take 3 mg by mouth at bedtime. Take immediately before bedtime   fluticasone (FLONASE) 50 MCG/ACT nasal spray Place 1 spray into both nostrils daily.   fluticasone furoate-vilanterol (BREO ELLIPTA) 200-25 MCG/INH AEPB Inhale 1 puff into the lungs daily.    gabapentin (NEURONTIN) 300 MG capsule Take 600 mg by mouth 3 (three) times daily.    Immune Globulin, Human, (HIZENTRA [place]) Inject into the skin once a week.   insulin glargine (LANTUS) 100 UNIT/ML injection Inject into the skin at bedtime as needed (high blood sugar).   insulin lispro (HUMALOG) 100 UNIT/ML injection Inject 10 Units into the skin 3 (three) times daily as needed for high blood sugar (for high blood sugar greather than 150).   ipratropium (ATROVENT) 0.06 % nasal spray Use two sprays in each nostril 3-4 times daily if needed for nasal drainage/congestion.   ipratropium-albuterol (DUONEB) 0.5-2.5 (3) MG/3ML SOLN    lacosamide (VIMPAT) 200 MG TABS tablet Take 200 mg by mouth 2 (two) times daily.   lamoTRIgine (LAMICTAL) 25 MG tablet Take 2 tablets by mouth 2 (two) times daily.   lidocaine (XYLOCAINE) 5 % ointment Apply 1-2 grams (1 gram = 1 inch) to the affected area 2-3 times daily for 30 days.   lidocaine-prilocaine (EMLA) cream    losartan (COZAAR) 50 MG tablet Take 1 tablet (50 mg total) by mouth daily.   montelukast (SINGULAIR) 10 MG tablet TAKE 1 TABLET BY MOUTH ONCE DAILY   nitroGLYCERIN (NITROSTAT) 0.4 MG SL tablet TAKE 1 TABLET UNDER THE TONGUE AS NEEDEDFOR CHEST PAIN ( MAY REPEAT EVERY 5 MINUTES X 3)   nystatin (MYCOSTATIN) 100000 UNIT/ML suspension Take 5 mLs by mouth 3 (three) times daily as needed (FOR THRUSH).    olmesartan (BENICAR) 20 MG tablet Take 1  tablet (20 mg total) by mouth daily.   Omega-3 Fatty Acids (FISH OIL PO) Take 2,000 mg by mouth 2 (two) times daily.    ondansetron (ZOFRAN) 8 MG tablet    oxyCODONE-acetaminophen (PERCOCET) 10-325 MG tablet One tablet every 4 hours as needed for pain.   PAZEO 0.7 % SOLN Place 1 drop into both eyes daily as needed.   ranolazine (RANEXA) 500 MG 12 hr tablet Take 1 tablet (500 mg total) by mouth 2 (two) times daily.   rOPINIRole (REQUIP) 4 MG tablet 4 mg.    rosuvastatin (CRESTOR) 5 MG tablet TAKE 1 TABLET BY MOUTH ONCE DAILY   SPIRIVA RESPIMAT 2.5 MCG/ACT AERS    tiZANidine (ZANAFLEX) 4 MG capsule Take 4 mg by mouth 3 (three) times daily.   Vitamin D, Cholecalciferol, 1000 units TABS Take 3 tablets by mouth daily.      Family History: The patient's family history  includes Allergic rhinitis in her brother; Asthma in her mother; COPD in her brother; Heart attack in her father; High blood pressure in her mother; Lung cancer in her mother; Seizures in her mother; Stroke in her mother.   ROS:   Please see the history of present illness.     All other systems reviewed and are negative.   Labs/Other Tests and Data Reviewed:     Recent Labs: No results found for requested labs within last 8760 hours.  Recent Lipid Panel No results found for: CHOL, TRIG, HDL, CHOLHDL, VLDL, LDLCALC, LDLDIRECT  Cardiac catheterization from 2018 showed:   Mid RCA lesion, 40 %stenosed.  1st Mrg lesion, 10 %stenosed.  The left ventricular systolic function is normal.  LV end diastolic pressure is normal.  The left ventricular ejection fraction is 55-65% by visual estimate.  There is no aortic valve stenosis.    Exam:    Vital Signs:  BP 125/82    Wt 170 lb (77.1 kg)    BMI 28.29 kg/m     Wt Readings from Last 3 Encounters:  03/21/19 170 lb (77.1 kg)  12/15/18 197 lb (89.4 kg)  09/07/18 189 lb 3.2 oz (85.8 kg)     Well nourished, well developed in no acute distress. Alert awake  and x3 talking to me over the video link.  Denies having any symptoms at the time of my interview  Diagnosis for this visit:   1. H/O peripheral vascular disease   2. Smoking   3. 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  History of peripheral vascular disease.  Stable, will continue present management 2.  Smoking we spent at least 5 minutes talking about need to quit she understands she will try to do that 3.  Poor dyslipidemia call her primary care physician to get fasting lipid profile 4.  COPD related to smoking she was advised to quit.  COVID-19 Education: The signs and symptoms of COVID-19 were discussed with the patient and how to seek care for testing (follow up with PCP or arrange E-visit).  The importance of social distancing was discussed today.  Patient Risk:   After full review of this patients clinical status, I feel that they are at least moderate risk at this time.  Time:   Today, I have spent 15 minutes with the patient with telehealth technology discussing pt health issues.  I spent 5 minutes reviewing her chart before the visit.  Visit was finished at 4:25 PM.    Medication Adjustments/Labs and Tests Ordered: Current medicines are reviewed at length with the patient today.  Concerns regarding medicines are outlined above.  No orders of the defined types were placed in this encounter.  Medication changes: No orders of the defined types were placed in this encounter.    Disposition: Follow-up 3 months  Signed, Park Liter, MD, [facility] 03/21/2019 4:28 PM    [place]

## 2019-04-19 ENCOUNTER — Telehealth: Payer: Self-pay | Admitting: Cardiology

## 2019-04-19 NOTE — Telephone Encounter (Signed)
Needs permission to stop Plavix for a procedure

## 2019-04-20 NOTE — Telephone Encounter (Signed)
Patient is have a upper and lower endoscopy. I will search for clearance.

## 2019-04-20 NOTE — Telephone Encounter (Signed)
Clearance faxed. Patient aware.

## 2019-06-06 ENCOUNTER — Telehealth: Payer: Self-pay | Admitting: Allergy

## 2019-06-06 DIAGNOSIS — D839 Common variable immunodeficiency, unspecified: Secondary | ICD-10-CM

## 2019-06-06 NOTE — Telephone Encounter (Signed)
Called [name] and she advised she is taking [name] for appt today and can stop by [facility] and get same done.  Went ahead and put orders in and advised we will reach out once we get results back

## 2019-06-06 NOTE — Telephone Encounter (Signed)
Thanks

## 2019-06-06 NOTE — Telephone Encounter (Signed)
Can someone call Kathy Moss to see if she can add the IgG lab order for her to have drawn or get instructions on how to add IgG order to her papework?

## 2019-06-06 NOTE — Telephone Encounter (Signed)
[name] called in and wanted to know if Dr. Nelva Bush would go ahead and order Kathy Moss's blood work ASAP.  She is having back surgery on the 26th and won't be able to give her, her infusions but every 2 weeks and wants to make sure that will be ok until she is better from her back surgery.  Please advise.

## 2019-06-06 NOTE — Telephone Encounter (Signed)
Tammy,    Please advise

## 2019-06-07 LAB — IGG: IgG (Immunoglobin G), Serum: 686 mg/dL (ref 586–1602)

## 2019-06-08 ENCOUNTER — Telehealth: Payer: Self-pay | Admitting: *Deleted

## 2019-06-08 NOTE — Telephone Encounter (Signed)
Per discussion with Dr Nelva Bush will need to increase patients Hizentra dose to 20 grams weekly or 40 grams biweekly.  Since patient has had issue with volume infused in past may need to stay on weekly dose especially since patient is having surgery and will be in hospital and susceptible to infections.  L/M for partner [name] to give me a call to discuss same.

## 2019-06-11 NOTE — Telephone Encounter (Signed)
I call and spoke to [facility], patients partner and advised will need to increase to 20 grams weekly or 40 grams biweekly.  I advised I knew in past that there was some issues with volume and didn't know if she could tolerate the biweekly dose.  Pam advised she was the one having the back surgery not [name] as I had understood and she would get patient's niece to help out with the infusions and will keep her at 20 grams weekly.  I called [facility] and gave verbal to increase dose and they will fax new orders.

## 2019-06-12 NOTE — Telephone Encounter (Signed)
Oh ok I see.  im glad they have someone else that will be able to provide Ninetta's infusions while Pam recovers.

## 2019-06-26 ENCOUNTER — Ambulatory Visit: Payer: Self-pay | Admitting: Allergy

## 2019-06-26 ENCOUNTER — Ambulatory Visit: Payer: Medicare Other | Admitting: Cardiology

## 2019-07-05 ENCOUNTER — Other Ambulatory Visit: Payer: Self-pay

## 2019-07-05 MED ORDER — RANOLAZINE ER 500 MG PO TB12: 500.0000 mg | ORAL_TABLET | Freq: Two times a day (BID) | ORAL | 1 refills | Status: DC

## 2019-07-05 MED ORDER — CLOPIDOGREL BISULFATE 75 MG PO TABS: 75.0000 mg | ORAL_TABLET | Freq: Every day | ORAL | 1 refills | Status: DC

## 2019-07-11 ENCOUNTER — Telehealth: Payer: Self-pay | Admitting: Cardiology

## 2019-07-11 MED ORDER — RANOLAZINE ER 500 MG PO TB12: 500.0000 mg | ORAL_TABLET | Freq: Two times a day (BID) | ORAL | 1 refills | Status: DC

## 2019-07-11 MED ORDER — ROSUVASTATIN CALCIUM 5 MG PO TABS: 5.0000 mg | ORAL_TABLET | Freq: Every day | ORAL | 1 refills | Status: DC

## 2019-07-11 MED ORDER — LOSARTAN POTASSIUM 50 MG PO TABS: 50.0000 mg | ORAL_TABLET | Freq: Every day | ORAL | 1 refills | Status: DC

## 2019-07-11 MED ORDER — AMLODIPINE BESYLATE 5 MG PO TABS: 5.0000 mg | ORAL_TABLET | Freq: Every day | ORAL | 1 refills | Status: DC

## 2019-07-11 MED ORDER — CLOPIDOGREL BISULFATE 75 MG PO TABS: 75.0000 mg | ORAL_TABLET | Freq: Every day | ORAL | 1 refills | Status: DC

## 2019-07-11 NOTE — Addendum Note (Signed)
Addended by: Aleatha Borer on: 07/11/2019 04:42 PM   Modules accepted: Orders

## 2019-07-11 NOTE — Telephone Encounter (Signed)
Wants all medications switched back to [place] [facility]

## 2019-07-11 NOTE — Telephone Encounter (Signed)
Refills sent [place]

## 2019-07-17 ENCOUNTER — Telehealth: Payer: Self-pay | Admitting: Cardiology

## 2019-07-17 NOTE — Telephone Encounter (Signed)
Wants to know if Friday's visit can be a virtual appt -- caregiver and transporter just had back surgery and is ub=nable to drive  Please call patient to discuss

## 2019-07-18 NOTE — Telephone Encounter (Signed)
Yes it can be virtual

## 2019-07-18 NOTE — Telephone Encounter (Signed)
[name] per dpr informed we will do a virtual appointment on Friday 07/20/2019

## 2019-07-20 ENCOUNTER — Other Ambulatory Visit: Payer: Self-pay

## 2019-07-20 ENCOUNTER — Encounter: Payer: Self-pay | Admitting: Cardiology

## 2019-07-20 ENCOUNTER — Telehealth (INDEPENDENT_AMBULATORY_CARE_PROVIDER_SITE_OTHER): Payer: Medicare Other | Admitting: Cardiology

## 2019-07-20 VITALS — BP 128/82 | Wt 169.0 lb

## 2019-07-20 DIAGNOSIS — E119 Type 2 diabetes mellitus without complications: Secondary | ICD-10-CM

## 2019-07-20 DIAGNOSIS — J431 Panlobular emphysema: Secondary | ICD-10-CM

## 2019-07-20 DIAGNOSIS — F17200 Nicotine dependence, unspecified, uncomplicated: Secondary | ICD-10-CM

## 2019-07-20 DIAGNOSIS — R072 Precordial pain: Secondary | ICD-10-CM

## 2019-07-20 NOTE — Progress Notes (Signed)
Virtual Visit via Video Note   This visit type was conducted due to national recommendations for restrictions regarding the COVID-19 Pandemic (e.g. social distancing) in an effort to limit this patient's exposure and mitigate transmission in our community.  Due to her co-morbid illnesses, this patient is at least at moderate risk for complications without adequate follow up.  This format is felt to be most appropriate for this patient at this time.  All issues noted in this document were discussed and addressed.  A limited physical exam was performed with this format.  Please refer to the patient's chart for her consent to telehealth for [facility].  Evaluation Performed:  Follow-up visit  This visit type was conducted due to national recommendations for restrictions regarding the COVID-19 Pandemic (e.g. social distancing).  This format is felt to be most appropriate for this patient at this time.  All issues noted in this document were discussed and addressed.  No physical exam was performed (except for noted visual exam findings with Video Visits).  Please refer to the patient's chart (MyChart message for video visits and phone note for telephone visits) for the patient's consent to telehealth for [facility].  Date:  07/20/2019  ID: ARDA YOH, DOB 03-20-1961, MRN ID:[id number]   Patient Location: [place] [address]   Provider location:   [place] Office  PCP:  Mayer Camel, NP  Cardiologist:  Jenne Campus, MD     Chief Complaint: Doing well  History of Present Illness:    Kathy Moss is a 58 y.o. female  who presents via audio/video conferencing for a telehealth visit today.  With coronary disease but only luminal disease on cardiac catheterization from summer 2018 she got 40% mid RCA lesion 10% first obtuse marginal branch.  Smoking with COPD, dyslipidemia, diabetes, hypertension.  She does have a televisit with me  today.  We talk over the video link.  She is doing very well like always she is with [name] overall doing well.  They decided to quit smoking to smoke only 5 cigarettes a day and they are joking good but very ill but overall making progress I congratulated her for this and encouraged her to continue.   The patient does not have symptoms concerning for COVID-19 infection (fever, chills, cough, or new SHORTNESS OF BREATH).    Prior CV studies:   The following studies were reviewed today:  Cardiac catheterization reviewed with description above.     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[place])    Hypertension    Recurrent upper respiratory infection (URI)    Seizures ([place])    Type 2 diabetes mellitus without complication, without long-term current use of insulin ([place]) 06/30/2016    Past Surgical History:  Procedure Laterality Date   APPENDECTOMY     BACK SURGERY     CHOLECYSTECTOMY     HERNIA REPAIR     LEFT HEART CATH AND CORONARY ANGIOGRAPHY N/A 03/16/2017   Procedure: Left Heart Cath and Coronary Angiography;  Surgeon: Jettie Booze, MD;  Location: [facility];  Service: Cardiovascular;  Laterality: N/A;     Current Meds  Medication Sig   albuterol (PROVENTIL HFA;VENTOLIN HFA) 108 (90 Base) MCG/ACT inhaler Inhale 1-2 puffs into the lungs every 6 (six) hours as needed for wheezing or shortness of breath.  allopurinol (ZYLOPRIM) 100 MG tablet    ALPRAZolam (XANAX) 0.5 MG tablet Take 0.5 mg by mouth every 8 (eight) hours as needed for anxiety.    AMITIZA 8 MCG capsule    amLODipine (NORVASC) 5 MG tablet Take 1 tablet (5 mg total) by mouth daily.   azelastine (ASTELIN) 0.1 % nasal spray Place 2 sprays into both nostrils 2 (two) times daily. Use in each nostril as directed    butalbital-acetaminophen-caffeine (FIORICET, ESGIC) 50-325-40 MG tablet TAKE 1-2  TABLET(S) BY MOUTH EVERY 4 HOURS AS NEEDED FOR PAIN.   cetirizine (ZYRTEC) 10 MG tablet Take one tablet once daily   clopidogrel (PLAVIX) 75 MG tablet Take 1 tablet (75 mg total) by mouth daily.   Cyanocobalamin (VITAMIN B-12 IJ) Inject as directed every 30 (thirty) days.   dicyclomine (BENTYL) 10 MG capsule    esomeprazole (NEXIUM) 40 MG capsule Take 40 mg by mouth 2 (two) times daily before a meal.    Eszopiclone (ESZOPICLONE) 3 MG TABS Take 3 mg by mouth at bedtime. Take immediately before bedtime   fluticasone (FLONASE) 50 MCG/ACT nasal spray Place 1 spray into both nostrils daily.   fluticasone furoate-vilanterol (BREO ELLIPTA) 200-25 MCG/INH AEPB Inhale 1 puff into the lungs daily.    gabapentin (NEURONTIN) 300 MG capsule Take 600 mg by mouth 3 (three) times daily.    Immune Globulin, Human, (HIZENTRA [place]) Inject into the skin once a week.   insulin glargine (LANTUS) 100 UNIT/ML injection Inject into the skin at bedtime as needed (high blood sugar).   insulin lispro (HUMALOG) 100 UNIT/ML injection Inject 10 Units into the skin 3 (three) times daily as needed for high blood sugar (for high blood sugar greather than 150).   ipratropium (ATROVENT) 0.06 % nasal spray Use two sprays in each nostril 3-4 times daily if needed for nasal drainage/congestion.   ipratropium-albuterol (DUONEB) 0.5-2.5 (3) MG/3ML SOLN    lacosamide (VIMPAT) 200 MG TABS tablet Take 200 mg by mouth 2 (two) times daily.   lamoTRIgine (LAMICTAL) 25 MG tablet Take 2 tablets by mouth 2 (two) times daily.   lidocaine (XYLOCAINE) 5 % ointment Apply 1-2 grams (1 gram = 1 inch) to the affected area 2-3 times daily for 30 days.   lidocaine-prilocaine (EMLA) cream    losartan (COZAAR) 50 MG tablet Take 1 tablet (50 mg total) by mouth daily.   montelukast (SINGULAIR) 10 MG tablet TAKE 1 TABLET BY MOUTH ONCE DAILY   nitroGLYCERIN (NITROSTAT) 0.4 MG SL tablet TAKE 1 TABLET UNDER THE TONGUE AS NEEDEDFOR CHEST PAIN  ( MAY REPEAT EVERY 5 MINUTES X 3)   nystatin (MYCOSTATIN) 100000 UNIT/ML suspension Take 5 mLs by mouth 3 (three) times daily as needed (FOR THRUSH).    olmesartan (BENICAR) 20 MG tablet Take 1 tablet (20 mg total) by mouth daily.   Omega-3 Fatty Acids (FISH OIL PO) Take 2,000 mg by mouth 2 (two) times daily.    ondansetron (ZOFRAN) 8 MG tablet    oxyCODONE-acetaminophen (PERCOCET) 10-325 MG tablet One tablet every 4 hours as needed for pain.   PAZEO 0.7 % SOLN Place 1 drop into both eyes daily as needed.   ranolazine (RANEXA) 500 MG 12 hr tablet Take 1 tablet (500 mg total) by mouth 2 (two) times daily.   rOPINIRole (REQUIP) 4 MG tablet 4 mg.    rosuvastatin (CRESTOR) 5 MG tablet Take 1 tablet (5 mg total) by mouth daily.   SPIRIVA RESPIMAT 2.5 MCG/ACT AERS    tiZANidine (  ZANAFLEX) 4 MG capsule Take 4 mg by mouth 3 (three) times daily.   Vitamin D, Cholecalciferol, 1000 units TABS Take 3 tablets by mouth daily.      Family History: The patient's family history includes Allergic rhinitis in her brother; Asthma in her mother; COPD in her brother; Heart attack in her father; High blood pressure in her mother; Lung cancer in her mother; Seizures in her mother; Stroke in her mother.   ROS:   Please see the history of present illness.     All other systems reviewed and are negative.   Labs/Other Tests and Data Reviewed:     Recent Labs: No results found for requested labs within last 8760 hours.  Recent Lipid Panel No results found for: CHOL, TRIG, HDL, CHOLHDL, VLDL, LDLCALC, LDLDIRECT    Exam:    Vital Signs:  BP 128/82    Wt 169 lb (76.7 kg)    SpO2 96%    BMI 28.12 kg/m     Wt Readings from Last 3 Encounters:  07/20/19 169 lb (76.7 kg)  03/21/19 170 lb (77.1 kg)  12/15/18 197 lb (89.4 kg)     Well nourished, well developed in no acute distress. Alert awake and x3 with talk over the video link not in any distress sitting at home and I am in our office in  hospital.  Diagnosis for this visit:   1. Panlobular emphysema ([place])   2. Type 2 diabetes mellitus without complication, without long-term current use of insulin ([place])   3. Precordial pain   4. Smoking      ASSESSMENT & PLAN:    1.  Precordial chest pain: Denies having any overall she says she is doing well. 2.  Panlobular emphysema.  Related to smoking she is working on quitting only 5 cigarettes a day is a great success.  I encouraged her to continue and encourage her to achieve ultimate goal which will be completed discontinuation of smoking. 3.  Precordial chest pain denies having any. 4.  Dyslipidemia she is getting ready to see her primary care physician on December I asked her to have her cholesterol rechecked.  COVID-19 Education: The signs and symptoms of COVID-19 were discussed with the patient and how to seek care for testing (follow up with PCP or arrange E-visit).  The importance of social distancing was discussed today.  Patient Risk:   After full review of this patients clinical status, I feel that they are at least moderate risk at this time.  Time:   Today, I have spent 10 minutes with the patient with telehealth technology discussing pt health issues.  I spent 15 minutes reviewing her chart before the visit.  Visit was finished at 2:26 PM.    Medication Adjustments/Labs and Tests Ordered: Current medicines are reviewed at length with the patient today.  Concerns regarding medicines are outlined above.  No orders of the defined types were placed in this encounter.  Medication changes: No orders of the defined types were placed in this encounter.    Disposition: Follow-up in 5 months  Signed, Park Liter, MD, [facility] 07/20/2019 2:24 PM    [place]

## 2019-07-20 NOTE — Patient Instructions (Signed)

## 2019-09-21 ENCOUNTER — Telehealth: Payer: Self-pay | Admitting: Allergy

## 2019-09-21 DIAGNOSIS — D839 Common variable immunodeficiency, unspecified: Secondary | ICD-10-CM

## 2019-09-21 NOTE — Telephone Encounter (Signed)
Kathy Moss called in and wanted to know if there was something that could be given to [facility] to help boost her immune system.  Kathy Moss has cataract surgery on one of her eyes and it is healing very slowly.  The eye doctor, Dr. Jefm Bryant, told [facility] her eye should have healed by now.  Dr. Jefm Bryant feels it is because of her weak immune system and suggested they contact Dr. Nelva Bush.  They will not do cataract surgery on Kathy Moss's other eye until this one is healed first.  I did inform Kathy Moss that Dr. Nelva Bush was out of the office and asked them if it was ok to send this to another provider and she stated she felt more comfortable waiting on Dr. Nelva Bush.  Please advise.

## 2019-09-21 NOTE — Telephone Encounter (Signed)
Other than potentially taking vitamin supplementation (like a daily multivitamin) I do not have any other recommendations for boosting her immune system other then continuing on Hizentra infusions.   We should though ensure her IgG still remains in good range since last drawn in October.   When there is poor wound healing a common concern that comes up is if there is an issue with diabetes and blood sugar control however this would need to be addressed between her eye doctor and her PCP.    Kathy Moss can we get an order for a IgG level that can be obtained whenever now on Hizentra.

## 2019-09-25 NOTE — Telephone Encounter (Signed)
Called and spoke with Pam and relayed Dr. Jeralyn Ruths message to her.  Epic order for IgG placed.

## 2019-10-13 LAB — IGG: IgG (Immunoglobin G), Serum: 813 mg/dL (ref 586–1602)

## 2019-11-13 ENCOUNTER — Other Ambulatory Visit: Payer: Self-pay | Admitting: Ophthalmology

## 2019-11-13 DIAGNOSIS — H53433 Sector or arcuate defects, bilateral: Secondary | ICD-10-CM

## 2019-11-27 ENCOUNTER — Ambulatory Visit
Admission: RE | Admit: 2019-11-27 | Discharge: 2019-11-27 | Disposition: A | Discharge status: Home / Self Care | Payer: Medicare Other | Source: Ambulatory Visit | Attending: Ophthalmology | Admitting: Ophthalmology

## 2019-11-27 ENCOUNTER — Other Ambulatory Visit: Payer: Self-pay

## 2019-11-27 DIAGNOSIS — H53433 Sector or arcuate defects, bilateral: Secondary | ICD-10-CM

## 2019-11-27 MED ORDER — GADOBENATE DIMEGLUMINE 529 MG/ML IV SOLN
15.0000 mL | Freq: Once | INTRAVENOUS | Status: AC | PRN
  Administered 2019-11-27: 15 mL via INTRAVENOUS

## 2019-12-10 ENCOUNTER — Other Ambulatory Visit: Payer: Medicare Other

## 2019-12-11 ENCOUNTER — Other Ambulatory Visit: Payer: Medicare Other

## 2019-12-20 ENCOUNTER — Telehealth: Payer: Self-pay | Admitting: Cardiology

## 2019-12-20 NOTE — Telephone Encounter (Signed)
I am routing to Dr. Agustin Cree to see if it is ok for this patient to hold her plavix for this long.

## 2019-12-20 NOTE — Telephone Encounter (Signed)
Kathy Moss, Spouse of the patient called. The patient needs to have a scan done and needs to stop her Plavix for 5 days starting today (12-20-19). The spouse wanted to know if this would be OK

## 2020-01-29 DIAGNOSIS — J029 Acute pharyngitis, unspecified: Secondary | ICD-10-CM | POA: Insufficient documentation

## 2020-01-29 HISTORY — DX: Acute pharyngitis, unspecified: J02.9

## 2020-02-06 ENCOUNTER — Telehealth: Payer: Self-pay | Admitting: *Deleted

## 2020-02-06 NOTE — Telephone Encounter (Signed)
Spoke to patient and advised Dr Nelva Bush instructions

## 2020-02-06 NOTE — Telephone Encounter (Signed)
[name] called in reference to patients last SCIG on Sunday.  She advised that patient got up Sunday night hot and cold and possible fever and moved around house and didn't remember doing so.  She did advise that patient has not been sleeping well possibly due to stress of [name] with liver cancer.  Also patient had just finished antibiotics and steroids for some respiratory and/or strep infection four days earlier. Patient has not experienced any other symptoms since Sunday night but wanted to let Dr Nelva Bush know.  She is still on same Rx Hizentra so I advised patient it would be unusual that Hizentra caused symptoms but will send message to Dr Nelva Bush

## 2020-02-06 NOTE — Telephone Encounter (Signed)
Thanks Tammy for the update. Agree these are not side effects related to IG.  Seems more tied to stress.  Would continue current plan.

## 2020-02-08 ENCOUNTER — Other Ambulatory Visit: Payer: Self-pay | Admitting: Cardiology

## 2020-04-21 ENCOUNTER — Other Ambulatory Visit: Payer: Self-pay | Admitting: Cardiology

## 2020-04-22 ENCOUNTER — Telehealth: Payer: Self-pay | Admitting: Cardiology

## 2020-04-22 NOTE — Telephone Encounter (Signed)
What kind of surgery I were talking about

## 2020-04-22 NOTE — Telephone Encounter (Signed)
Left message on patients voicemail to please return our call.   

## 2020-04-22 NOTE — Telephone Encounter (Signed)
Follow up   Pts wife is returning call   Call transferred to [facility]

## 2020-04-22 NOTE — Telephone Encounter (Signed)
    Pt's wife calling, she said pt needs to be seen by Dr. Raliegh Ip to clear pt for upcoming surgery. First available is in November, she said pt needs to be seen sooner.

## 2020-04-22 NOTE — Telephone Encounter (Signed)
Spoke with the patient just now and she let me know that she is having a complete shoulder replacement done at [facility]. She does not have the surgery scheduled yet.

## 2020-04-23 ENCOUNTER — Other Ambulatory Visit: Payer: Self-pay | Admitting: Cardiology

## 2020-04-23 NOTE — Telephone Encounter (Signed)
She to have appointment before that

## 2020-04-23 NOTE — Telephone Encounter (Signed)
Left message for patient to return call.

## 2020-04-25 ENCOUNTER — Telehealth: Payer: Self-pay | Admitting: Emergency Medicine

## 2020-04-25 ENCOUNTER — Telehealth: Payer: Self-pay | Admitting: Allergy

## 2020-04-25 NOTE — Telephone Encounter (Signed)
   [facility] Pre-operative Risk Assessment    HEARTCARE STAFF: - Please ensure there is not already an duplicate clearance open for this procedure. - Under Visit Info/Reason for Call, type in Other and utilize the format Clearance MM/DD/YY or Clearance TBD. Do not use dashes or single digits. - If request is for dental extraction, please clarify the # of teeth to be extracted.  Request for surgical clearance:  1. What type of surgery is being performed? Left total shoulder replacement    2. When is this surgery scheduled? Not specified   3. What type of clearance is required (medical clearance vs. Pharmacy clearance to hold med vs. Both)? Medical   4. Are there any medications that need to be held prior to surgery and how long?none specified   5. Practice name and name of physician performing surgery? [facility] , Creig Hines, MD  6. What is the office phone number? [phone]   7.   What is the office fax number? [phone]  8.   Anesthesia type (None, local, MAC, general) ?general    Kathy Moss 04/25/2020, 5:21 PM  _________________________________________________________________   (provider comments below)

## 2020-04-25 NOTE — Telephone Encounter (Signed)
[name] called in and was very upset.  [name] states the company that is responsible for providing [facility] infusions have been very disrespectful and harassing.  [name] states when she receives the medication in the mail, the medication is hot and only one cooling pack is provided and it is melted.  When she called to mention this, [name] states they were very rude to her and told her she could just put it in the fridge and it will be fine.  [name] doesn't feel comfortable giving [facility] medicine that has been heated and then cooled.  [name] also stated they are harassing Pre[facility] for money and they disrespect her every time they try to collect on their money.  [name] is very upset and wants to see if there is a different route [facility]iva can take.  She states if there is a pill or injections or something but she is tired of dealing with the infusion people.  I told her I wasn't familiar with this situation and transferred her to [facility].  [name] called back and said Tammy didn't answer and that she wanted this taken care of right away.  So I offered to send Tammy and Dr. Nelva Bush a message and she agreed.  Please advise.

## 2020-04-25 NOTE — Telephone Encounter (Signed)
Left message for patient to return call.

## 2020-04-25 NOTE — Telephone Encounter (Signed)
What is her current regimen? Is she still doing IVIG?

## 2020-04-28 NOTE — Telephone Encounter (Signed)
Called and left message for the pt to call the office [phone] to schedule a pre op appt with Dr. Agustin Cree or APP.

## 2020-04-28 NOTE — Telephone Encounter (Signed)
   Primary Cardiologist:Robert Agustin Cree, MD  Chart reviewed as part of pre-operative protocol coverage. In a phone note from 04/22/20, Dr. Agustin Cree advised she will require an appointment before she can have this done. It does appear Linna Hoff tried to contact patient but do not see she was able to reach patient.  Pre-op covering staff: - Please schedule appointment and call patient to inform them. If patient already had an upcoming appointment within acceptable timeframe, please add "pre-op clearance" to the appointment notes so provider is aware. - Please contact requesting surgeon's office via preferred method (i.e, phone, fax) to inform them of need for appointment prior to surgery.   Charlie Pitter, PA-C  04/28/2020, 8:28 AM

## 2020-04-28 NOTE — Telephone Encounter (Signed)
I will check on her dosing and reach out to [facility] in regards to changing to another company

## 2020-04-28 NOTE — Telephone Encounter (Signed)
appt 04/30/20 for pre op clearance. I will forward notes to MD for upcoming appt.

## 2020-04-29 NOTE — Telephone Encounter (Signed)
Called and spoke to patient's wife per dpr. Confirmed appointment with her so patient can be seen tomorrow in [place].

## 2020-04-30 ENCOUNTER — Telehealth (INDEPENDENT_AMBULATORY_CARE_PROVIDER_SITE_OTHER): Payer: Medicare Other | Admitting: Cardiology

## 2020-04-30 ENCOUNTER — Encounter: Payer: Self-pay | Admitting: Cardiology

## 2020-04-30 VITALS — BP 122/78 | HR 77 | Ht 65.0 in | Wt 170.0 lb

## 2020-04-30 DIAGNOSIS — F17200 Nicotine dependence, unspecified, uncomplicated: Secondary | ICD-10-CM

## 2020-04-30 DIAGNOSIS — I2510 Atherosclerotic heart disease of native coronary artery without angina pectoris: Secondary | ICD-10-CM

## 2020-04-30 DIAGNOSIS — E119 Type 2 diabetes mellitus without complications: Secondary | ICD-10-CM | POA: Diagnosis not present

## 2020-04-30 DIAGNOSIS — Z01810 Encounter for preprocedural cardiovascular examination: Secondary | ICD-10-CM

## 2020-04-30 DIAGNOSIS — J431 Panlobular emphysema: Secondary | ICD-10-CM

## 2020-04-30 HISTORY — DX: Encounter for preprocedural cardiovascular examination: Z01.810

## 2020-04-30 NOTE — Patient Instructions (Signed)
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
## 2020-04-30 NOTE — Progress Notes (Signed)
Virtual Visit via Video Note   This visit type was conducted due to national recommendations for restrictions regarding the COVID-19 Pandemic (e.g. social distancing) in an effort to limit this patient's exposure and mitigate transmission in our community.  Due to her co-morbid illnesses, this patient is at least at moderate risk for complications without adequate follow up.  This format is felt to be most appropriate for this patient at this time.  All issues noted in this document were discussed and addressed.  A limited physical exam was performed with this format.  Please refer to the patient's chart for her consent to telehealth for [facility].     Evaluation Performed:  Follow-up visit  This visit type was conducted due to national recommendations for restrictions regarding the COVID-19 Pandemic (e.g. social distancing).  This format is felt to be most appropriate for this patient at this time.  All issues noted in this document were discussed and addressed.  No physical exam was performed (except for noted visual exam findings with Video Visits).  Please refer to the patient's chart (MyChart message for video visits and phone note for telephone visits) for the patient's consent to telehealth for [facility].  Date:  04/30/2020  ID: Kathy Moss, DOB 04-Jan-1961, MRN [id number]   Patient Location: [place] [address]   Provider location:   [place] Office  PCP:  Mayer Camel, NP  Cardiologist:  Jenne Campus, MD     Chief Complaint: I need shoulder surgery  History of Present Illness:    Kathy Moss is a 59 y.o. female  who presents via audio/video conferencing for a telehealth visit today.  With past medical history significant for COPD, essential hypertension, type 2 diabetes, coronary artery disease.  Last estimation of her coronary artery status was done by cardiac catheterization in 2018 which showed only 40% mid  right coronary artery lesion.  She is here talking to me today because she needs shoulder surgery and she would like to have assess her risk factors before the surgery.  She denies have any chest pain tightness squeezing pressure burning chest she does have some shortness of breath not always but otherwise seems to be doing fine.  She tells me that she does not have stairs at home but she can go to [place] and walk around with no difficulties.  There is no decrease in her ability to exercise within the last 6 months.  Think she does minimum at least 4 METS while walking.   The patient does not have symptoms concerning for COVID-19 infection (fever, chills, cough, or new SHORTNESS OF BREATH).    Prior CV studies:   The following studies were reviewed today:  Cardiac catheterization reviewed before the visit showing 40% stenosis of mid RCA disease from 2018.     Past Medical History:  Diagnosis Date  . Angina at rest [facility]) 2013  . Asthma   . 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 Hypertension   . Recurrent upper respiratory infection (URI)   . Seizures ([place])   . Type 2 diabetes mellitus without complication, without long-term current use of insulin ([place]) 06/30/2016    Past Surgical History:  Procedure Laterality Date  . APPENDECTOMY    . BACK SURGERY    . CHOLECYSTECTOMY    . HERNIA REPAIR    . LEFT HEART CATH AND CORONARY ANGIOGRAPHY N/A 03/16/2017   Procedure: Left Heart Cath and Coronary Angiography;  Surgeon: Jettie Booze, MD;  Location: [facility];  Service: Cardiovascular;  Laterality: N/A;     Current Meds  Medication Sig  . albuterol (PROVENTIL HFA;VENTOLIN HFA) 108 (90 Base) MCG/ACT inhaler Inhale 1-2 puffs into the lungs every 6 (six) hours as needed for wheezing or shortness of breath.  . allopurinol (ZYLOPRIM) 100 MG tablet   . ALPRAZolam (XANAX) 0.5 MG tablet Take 0.5 mg by mouth every 8 (eight) hours as needed for anxiety.   . AMITIZA 8  MCG capsule   . amLODipine (NORVASC) 5 MG tablet Take 1 tablet (5 mg total) by mouth daily.  Marland Kitchen azelastine (ASTELIN) 0.1 % nasal spray Place 2 sprays into both nostrils 2 (two) times daily. Use in each nostril as directed   . butalbital-acetaminophen-caffeine (FIORICET, ESGIC) 50-325-40 MG tablet TAKE 1-2 TABLET(S) BY MOUTH EVERY 4 HOURS AS NEEDED FOR PAIN.  . cetirizine (ZYRTEC) 10 MG tablet Take one tablet once daily  . clopidogrel (PLAVIX) 75 MG tablet Take 1 tablet (75 mg total) by mouth daily.  . Cyanocobalamin (VITAMIN B-12 IJ) Inject as directed every 30 (thirty) days.  Marland Kitchen dicyclomine (BENTYL) 10 MG capsule   . esomeprazole (NEXIUM) 40 MG capsule Take 40 mg by mouth 2 (two) times daily before a meal.   . fluticasone (FLONASE) 50 MCG/ACT nasal spray Place 1 spray into both nostrils daily.  Marland Kitchen gabapentin (NEURONTIN) 300 MG capsule Take 600 mg by mouth 3 (three) times daily.   . Immune Globulin, Human, (HIZENTRA [place]) Inject into the skin once a week.  . insulin glargine (LANTUS) 100 UNIT/ML injection Inject into the skin at bedtime as needed (high blood sugar).  . insulin lispro (HUMALOG) 100 UNIT/ML injection Inject 10 Units into the skin 3 (three) times daily as needed for high blood sugar (for high blood sugar greather than 150).  Marland Kitchen ipratropium-albuterol (DUONEB) 0.5-2.5 (3) MG/3ML SOLN   . lacosamide (VIMPAT) 200 MG TABS tablet Take 200 mg by mouth 2 (two) times daily.  Marland Kitchen lamoTRIgine (LAMICTAL) 25 MG tablet Take 2 tablets by mouth 2 (two) times daily.  Marland Kitchen lidocaine (XYLOCAINE) 5 % ointment Apply 1-2 grams (1 gram = 1 inch) to the affected area 2-3 times daily for 30 days.  Marland Kitchen lidocaine-prilocaine (EMLA) cream   . losartan (COZAAR) 50 MG tablet TAKE 1 TABLET BY MOUTH ONCE DAILY..  . montelukast (SINGULAIR) 10 MG tablet TAKE 1 TABLET BY MOUTH ONCE DAILY  . nitroGLYCERIN (NITROSTAT) 0.4 MG SL tablet TAKE 1 TABLET UNDER THE TONGUE AS NEEDEDFOR CHEST PAIN ( MAY REPEAT EVERY 5 MINUTES X 3)  .  nystatin (MYCOSTATIN) 100000 UNIT/ML suspension Take 5 mLs by mouth 3 (three) times daily as needed (FOR THRUSH).   Marland Kitchen olmesartan (BENICAR) 20 MG tablet Take 1 tablet (20 mg total) by mouth daily.  . ondansetron (ZOFRAN) 8 MG tablet   . oxyCODONE-acetaminophen (PERCOCET) 10-325 MG tablet One tablet every 4 hours as needed for pain.  . ranolazine (RANEXA) 500 MG 12 hr tablet TAKE 1 TABLET BY MOUTH TWICE DAILY.  Marland Kitchen rOPINIRole (REQUIP) 4 MG tablet 4 mg.   . rosuvastatin (CRESTOR) 5 MG tablet Take 1 tablet (5 mg total) by mouth daily.  Marland Kitchen tiZANidine (ZANAFLEX) 4 MG capsule Take 4 mg by mouth 3 (three) times daily.  . Vitamin D, Cholecalciferol, 1000 units TABS Take 3 tablets by mouth daily.      Family History: The patient's family history includes Allergic rhinitis in her brother; Asthma in her mother;  COPD in her brother; Heart attack in her father; High blood pressure in her mother; Lung cancer in her mother; Seizures in her mother; Stroke in her mother.   ROS:   Please see the history of present illness.     All other systems reviewed and are negative.   Labs/Other Tests and Data Reviewed:     Recent Labs: No results found for requested labs within last 8760 hours.  Recent Lipid Panel No results found for: CHOL, TRIG, HDL, CHOLHDL, VLDL, LDLCALC, LDLDIRECT    Exam:    Vital Signs:  BP 122/78   Pulse 77   Ht 5\' 5"  (1.651 m)   Wt 170 lb (77.1 kg)   SpO2 97%   BMI 28.29 kg/m     Wt Readings from Last 3 Encounters:  04/30/20 170 lb (77.1 kg)  07/20/19 169 lb (76.7 kg)  03/21/19 170 lb (77.1 kg)     Well nourished, well developed in no acute distress. Alert awake and x3 happy to be able to talk to me.  Not in any distress at the time of my interview  Diagnosis for this visit:   1. Panlobular emphysema ([place])   2. Coronary artery disease involving native coronary artery of native heart without angina pectoris   3. Type 2 diabetes mellitus without complication, without  long-term current use of insulin (HCC)   4. Smoking   5. Preop cardiovascular exam      ASSESSMENT & PLAN:    1.  Emphysema obviously a problem.  She said that she smokes only 1 cigarettes a day and I congratulated for this and strongly encouraged her to completely stop smoking. 2.  Coronary artery disease cardiac catheterization 2000 1840% mid RCA lesion, likely asymptomatic and overall with fairly good exercise tolerance. 3.  Cardiovascular preop evaluation from cardiac standpoint of view should be fine to proceed with surgery as scheduled.  According to her she got good exercise tolerance more than 4 METS and asymptomatic.  Cardiac catheterization reviewed before that visit. 4.  Smoking lately she smokes only 1 cigarettes a day and I encouraged her to completely discontinue. 5.  Type 2 diabetes: Followed by internal medicine team.  COVID-19 Education: The signs and symptoms of COVID-19 were discussed with the patient and how to seek care for testing (follow up with PCP or arrange E-visit).  The importance of social distancing was discussed today.  Patient Risk:   After full review of this patients clinical status, I feel that they are at least moderate risk at this time.  Time:   Today, I have spent 15 minutes with the patient with telehealth technology discussing pt health issues.  I spent 20 minutes reviewing her chart before the visit.  Visit was finished at 10:47 AM.    Medication Adjustments/Labs and Tests Ordered: Current medicines are reviewed at length with the patient today.  Concerns regarding medicines are outlined above.  No orders of the defined types were placed in this encounter.  Medication changes: No orders of the defined types were placed in this encounter.    Disposition: Follow-up 6 months  Signed, Park Liter, MD, [facility] 04/30/2020 10:44 AM    [place]

## 2020-05-02 ENCOUNTER — Other Ambulatory Visit: Payer: Self-pay | Admitting: Cardiology

## 2020-05-29 ENCOUNTER — Telehealth: Payer: Self-pay | Admitting: Allergy

## 2020-05-29 NOTE — Telephone Encounter (Signed)
Pam called in and states that Donovan has been sick for 2 to 3 weeks with bronchitis.  Both Raliyah and Pam were tested for Covid twice and both were negative.  While Talayah had bronchitis, Pam states she did not give Almeta her infusions.  Pam stated that Lak[place] was feeling much better so she decided to give her the infusion last night.  Menucha today is complaining of headaches, body aches, fatigue, and feels like she is running a fever.  Pam did not take her temperature.  Pam states that [place] made comments that she feels like she has the flu but that's just how the infusion is making her feel.  Pam said she doesn't know if Jaimya is feeling this way because of how long she went without an infusion, or is it too strong for her?  Pam is wondering if blood work needs to be done to check on [place].  Please advise.

## 2020-05-29 NOTE — Telephone Encounter (Signed)
Pam informed of Dr. Jeralyn Ruths message.

## 2020-05-29 NOTE — Telephone Encounter (Signed)
Yes she may be having a stronger immune response to the infusion since it was delayed a bit.  The symptoms she is having can be side effects related to infusion especially the headaches/aches and fatigue.  Sometimes these symptoms can be seen when we are not fully hydrated prior to infusion which could be the case if she was recently sick with bronchitis.   Would use pain reliever medications she can take like ibuprofen or tylenol if she tolerate use of either of those and would increase her fluid intake at this time.

## 2020-06-12 ENCOUNTER — Telehealth: Payer: Self-pay | Admitting: Cardiology

## 2020-06-12 NOTE — Telephone Encounter (Signed)
Pt said that they got the ok from Dr. Agustin Cree to have surgery done at [facility]. Said that they need that clearance sent over asap. Please call

## 2020-06-12 NOTE — Telephone Encounter (Signed)
Called and spoke to [place] per dpr. Informed her preop call was forwarded back to preop pool for them to send off. She verbally understood. No further questions.

## 2020-06-13 NOTE — Telephone Encounter (Signed)
Dr. Wendy Poet preop clearance note was resent to the surgeon's office

## 2020-06-18 DIAGNOSIS — Z01818 Encounter for other preprocedural examination: Secondary | ICD-10-CM

## 2020-07-09 ENCOUNTER — Other Ambulatory Visit: Payer: Self-pay | Admitting: Cardiology

## 2020-07-17 ENCOUNTER — Other Ambulatory Visit: Payer: Self-pay | Admitting: Cardiology

## 2020-08-09 ENCOUNTER — Other Ambulatory Visit: Payer: Self-pay | Admitting: Cardiology

## 2020-08-19 ENCOUNTER — Ambulatory Visit: Payer: Medicare Other | Admitting: Allergy

## 2020-08-26 ENCOUNTER — Ambulatory Visit: Payer: Medicare Other | Admitting: Allergy

## 2020-09-16 ENCOUNTER — Telehealth: Payer: Self-pay | Admitting: *Deleted

## 2020-09-16 NOTE — Telephone Encounter (Signed)
Called patient and advised her thatI had spoken to [place] and they will get in touch to have nursing come out to show patients sister how to do infusions since her partner passed away

## 2020-10-02 DIAGNOSIS — F411 Generalized anxiety disorder: Secondary | ICD-10-CM

## 2020-10-02 HISTORY — DX: Generalized anxiety disorder: F41.1

## 2020-10-09 ENCOUNTER — Telehealth: Payer: Self-pay | Admitting: *Deleted

## 2020-10-09 NOTE — Telephone Encounter (Signed)
L/m for patient that her Ins is requiring approval for her Hizentra but she has not been seen in clinic in almost two years so they will not approve until she comes in to see Dr Nelva Bush. Advised to contact clinic or me for appt

## 2020-10-21 ENCOUNTER — Ambulatory Visit (INDEPENDENT_AMBULATORY_CARE_PROVIDER_SITE_OTHER): Payer: Medicare Other | Admitting: Allergy

## 2020-10-21 ENCOUNTER — Encounter: Payer: Self-pay | Admitting: Allergy

## 2020-10-21 ENCOUNTER — Other Ambulatory Visit: Payer: Self-pay

## 2020-10-21 VITALS — BP 108/60 | HR 64 | Resp 16 | Ht 64.5 in | Wt 162.0 lb

## 2020-10-21 DIAGNOSIS — J309 Allergic rhinitis, unspecified: Secondary | ICD-10-CM | POA: Diagnosis not present

## 2020-10-21 DIAGNOSIS — H1013 Acute atopic conjunctivitis, bilateral: Secondary | ICD-10-CM | POA: Diagnosis not present

## 2020-10-21 DIAGNOSIS — J449 Chronic obstructive pulmonary disease, unspecified: Secondary | ICD-10-CM | POA: Diagnosis not present

## 2020-10-21 DIAGNOSIS — H1010 Acute atopic conjunctivitis, unspecified eye: Secondary | ICD-10-CM

## 2020-10-21 DIAGNOSIS — D839 Common variable immunodeficiency, unspecified: Secondary | ICD-10-CM

## 2020-10-21 MED ORDER — OLOPATADINE HCL 0.2 % OP SOLN: OPHTHALMIC | 5 refills | Status: DC

## 2020-10-21 MED ORDER — IPRATROPIUM BROMIDE 0.06 % NA SOLN: NASAL | 5 refills | Status: DC

## 2020-10-21 NOTE — Progress Notes (Signed)
Follow-up Note  RE: Kathy Moss MRN: [id number] DOB: 04/22/1961 Date of Office Visit: 10/21/2020   History of present illness: Kathy Moss is a 60 y.o. female presenting today for follow-up of CVID.  She also has history of allergic rhinoconjunctivitis and COPD with asthma overlap.  She was last in the office on 12/26/2018 by myself.  She has had a very eventful year.  Her partner who helped greatly with her caregiving passed away.  Her sister is now helping her in this capacity and does give her her Hizentra infusion weekly in her abdomen.  She states she gets a headache after the infusion and has to go lay down.  She states when she gets up from resting the headache is gone and she feels better.  She states she is very well-hydrated and can drink a case of water in a day or 2.  She does continue to pretreat with Tylenol and Benadryl like she was with her IV infusions.  Unfortunately she is unable to do NSAIDs due to contraindication.  She is also has had side effects related to Imitrex and amitriptyline use in the past.   She states she has had infections and thought she had PNA her clinicians based on lung exam and was treated with antibiotics and steroid.  This was several weeks ago.  She states she tested negative twice for Covid.  She did not require any hospitalization or IV antibiotics.  She states she has had 2-3 other infections since the last visit all treated outpatient.  She does report more nasal drainage at this time.  She is not sure if she has nasal Atrovent at home but states she does have a nasal spray at home.  She will use Zyrtec as needed.  She does continue on singular daily. She continues to follow with Dr. Carren Rang in pulmonology and is on Trelegy now.  She does feel this is more effective than the Breo and Spiriva she was on at her last visit.  She has rare use of albuterol with Trelegy.  Review of systems: Review of Systems  Constitutional: Positive for fever.  HENT:  Positive for congestion.   Eyes: Negative.   Respiratory: Positive for cough and shortness of breath.   Cardiovascular: Negative.   Gastrointestinal: Negative.   Musculoskeletal: Negative.   Skin: Positive for itching and rash.  Neurological: Positive for headaches.    All other systems negative unless noted above in HPI  Past medical/social/surgical/family history have been reviewed and are unchanged unless specifically indicated below.  No changes  Medication List: Current Outpatient Medications  Medication Sig Dispense Refill  . albuterol (PROVENTIL HFA;VENTOLIN HFA) 108 (90 Base) MCG/ACT inhaler Inhale 1-2 puffs into the lungs every 6 (six) hours as needed for wheezing or shortness of breath.    . ALLERGY RELIEF 25 MG tablet SMARTSIG:2 Tablet(s) By Mouth    . allopurinol (ZYLOPRIM) 100 MG tablet     . ALPRAZolam (XANAX) 0.5 MG tablet Take 0.5 mg by mouth every 8 (eight) hours as needed for anxiety.     Marland Kitchen amLODipine (NORVASC) 5 MG tablet TAKE 1 TABLET BY MOUTH ONCE DAILY. 90 tablet 1  . cetirizine (ZYRTEC) 10 MG tablet Take one tablet once daily 30 tablet 5  . clopidogrel (PLAVIX) 75 MG tablet Take 1 tablet (75 mg total) by mouth daily. 90 tablet 1  . Cyanocobalamin (VITAMIN B-12 IJ) Inject as directed every 30 (thirty) days.    Marland Kitchen dicyclomine (BENTYL) 10  MG capsule     . EPINEPHrine 0.3 mg/0.3 mL IJ SOAJ injection Inject into the muscle.    . ergocalciferol (VITAMIN D2) 1.25 MG (50000 UT) capsule Take 1 capsule by mouth once a week.    . fluticasone (FLONASE) 50 MCG/ACT nasal spray Place 1 spray into both nostrils daily.    . furosemide (LASIX) 20 MG tablet Take 20 mg by mouth daily.    Marland Kitchen gabapentin (NEURONTIN) 300 MG capsule Take 600 mg by mouth 3 (three) times daily.     Marland Kitchen GOODSENSE PAIN RELIEF 650 MG CR tablet SMARTSIG:1 Tablet(s) By Mouth    . HIZENTRA 10 GM/50ML SOLN SMARTSIG:20 SUB-Q Once a Week    . ipratropium-albuterol (DUONEB) 0.5-2.5 (3) MG/3ML SOLN     . isosorbide  mononitrate (IMDUR) 30 MG 24 hr tablet Take 30 mg by mouth daily.    Marland Kitchen lacosamide (VIMPAT) 200 MG TABS tablet Take 200 mg by mouth 2 (two) times daily.    Marland Kitchen lamoTRIgine (LAMICTAL) 25 MG tablet Take 2 tablets by mouth 2 (two) times daily.    Marland Kitchen LINZESS 145 MCG CAPS capsule Take 145 mcg by mouth daily.    Marland Kitchen losartan (COZAAR) 50 MG tablet TAKE 1 TABLET BY MOUTH ONCE DAILY.. 90 tablet 2  . montelukast (SINGULAIR) 10 MG tablet TAKE 1 TABLET BY MOUTH ONCE DAILY 30 tablet 5  . nitroGLYCERIN (NITROSTAT) 0.4 MG SL tablet TAKE 1 TABLET UNDER THE TONGUE AS NEEDEDFOR CHEST PAIN ( MAY REPEAT EVERY 5 MINUTES X 3) 30 tablet 5  . ondansetron (ZOFRAN) 8 MG tablet     . oxyCODONE-acetaminophen (PERCOCET) 10-325 MG tablet One tablet every 4 hours as needed for pain.    . pantoprazole (PROTONIX) 40 MG tablet Take 40 mg by mouth daily.    . ranolazine (RANEXA) 500 MG 12 hr tablet TAKE 1 TABLET BY MOUTH TWICE DAILY. 180 tablet 2  . rOPINIRole (REQUIP) 4 MG tablet 4 mg.     . rosuvastatin (CRESTOR) 5 MG tablet TAKE 1 TABLET BY MOUTH ONCE DAILY. 90 tablet 3  . tiZANidine (ZANAFLEX) 4 MG tablet Take 4 mg by mouth 3 (three) times daily.    . TRELEGY ELLIPTA 100-62.5-25 MCG/INH AEPB SMARTSIG:1 Via Inhaler Every Night    . Vitamin D, Cholecalciferol, 1000 units TABS Take 3 tablets by mouth daily.    . Immune Globulin, Human, (HIZENTRA [place]) Inject into the skin once a week.     No current facility-administered medications for this visit.     Known medication allergies: Allergies  Allergen Reactions  . Carimune [Immune Globulin] Anaphylaxis  . Adhesive [Tape] Other (See Comments)    TAKES SKIN OFF (paper/silk tape) MEDICAL TAPE CAUSES BRUISES AND TEARS SKIN,  PAPER TAPE IS OK  . Amitriptyline Palpitations  . Imitrex [Sumatriptan] Palpitations  . Nsaids Other (See Comments)    Renal insuff  . Tramadol Palpitations  . Buprenorphine Hcl Itching  . Morphine Itching  . Morphine And Related Itching     Physical  examination: Blood pressure 108/60, pulse 64, resp. rate 16, height 5' 4.5" (1.638 m), weight 162 lb (73.5 kg), SpO2 95 %.  General: Alert, interactive, in no acute distress. HEENT: PERRLA< TMs pearly gray, turbinates minimally edematous without discharge, post-pharynx non erythematous. Neck: Supple without lymphadenopathy. Lungs: Clear to auscultation without wheezing, rhonchi or rales. {no increased work of breathing. CV: Normal S1, S2 without murmurs. Abdomen: Nondistended, nontender. Skin: 4 ecchymotic lesions on her abdomen. Extremities:  No clubbing, cyanosis or  edema. Neuro:   Grossly intact.  Diagnositics/Labs: None today  Assessment and plan:   Common Variable Immunodeficiency (CVID)    - Continue subcutaneous immunoglobulin (Hizentra) at this time.  Will discuss options to see if we can decrease side effects (headache) related to Hizentra.  There are some new IgG replacement products that can allow for infusion of less volume and also have less risk of side effects like headache.  Continue weekly Hizentra until you here from my office ([place]) regarding potential change.  Continue use of Tylenol and benadryl prior to your infusions at this time    - Continue Hizentra infusions weekly.  Alternate injection sites over abdomen.      - Check IgG levels to ensure adequate dose of Hizentra  Allergic rhinoconjunctivitis  - use nasal atrovent 2 sprays each nostril up to 3-4 times a day as needed for nasal congestion/drainage  - use Zyrtec 10mg  daily as needed  - continue singulair 10mg  daily   - for itchy/watery/red eyes use Pataday or Pazeo 1 drop each eye daily as needed  COPD/Asthma   - continue your current medications as per Dr. Carren Rang at this time-- Trelegy 1 puff daily  and as needed albuterol    - continue singulair for additional control    Follow-up in 4 months or sooner if needed.    I appreciate the opportunity to take part in [place] care. Please do not hesitate to  contact me with questions.  Sincerely,   Prudy Feeler, MD Allergy/Immunology [facility]

## 2020-10-21 NOTE — Patient Instructions (Addendum)
Common Variable Immunodeficiency (CVID)    - Continue subcutaneous immunoglobulin (Hizentra) at this time.  Will discuss options to see if we can decrease side effects (headache) related to Hizentra.  There are some new IgG replacement products that can allow for infusion of less volume and also have less risk of side effects like headache.  Continue weekly Hizentra until you here from my office ([facility]) regarding potential change.  Continue use of Tylenol and benadryl prior to your infusions at this time    - Continue Hizentra infusions weekly.  Alternate injection sites over abdomen.      - Check IgG levels to ensure adequate dose of Hizentra  Allergies   - use nasal atrovent 2 sprays each nostril up to 3-4 times a day as needed for nasal congestion/drainage  - use Zyrtec 10mg  daily as needed  - continue singulair 10mg  daily   - for itchy/watery/red eyes use Pataday or Pazeo 1 drop each eye daily as needed  COPD/Asthma   - continue your current medications as per Dr. Carren Rang at this time-- Trelegy 1 puff daily  and as needed albuterol    - continue singulair for additional control    Follow-up in 4 months or sooner if needed.

## 2020-10-27 ENCOUNTER — Telehealth: Payer: Self-pay | Admitting: *Deleted

## 2020-10-27 NOTE — Telephone Encounter (Signed)
L/mfor sister to contact me to advise change from Hizentra to [place].

## 2020-10-27 NOTE — Telephone Encounter (Signed)
-----   Message from [place], MD sent at 10/22/2020 11:25 AM EST ----- Hi Kathy Moss.  She lists having headaches with Hizentra.  Was thinking about either Xembify or Cuvitru as they both supposedly have a lesser side effect profile.  Does either have better coverage with her insurance and will be easier to switch from Hizentra?

## 2020-10-29 ENCOUNTER — Ambulatory Visit: Payer: Medicare Other | Admitting: Cardiology

## 2020-10-30 NOTE — Telephone Encounter (Signed)
L/m for patient to contact me  

## 2020-10-31 NOTE — Telephone Encounter (Signed)
Talked to patient sister Joelene Millin that will be helping with infusions and advised change to [facility] with same dose as Hizentra.  Advised her to let us know if patient still experiencing headaches that we may need to split dose into 2 days per week if that is the case (per pharmacist)

## 2020-10-31 NOTE — Telephone Encounter (Signed)
Ok that sounds good.  Hopefully the change to xembify resolves the headaches

## 2020-11-19 ENCOUNTER — Ambulatory Visit (INDEPENDENT_AMBULATORY_CARE_PROVIDER_SITE_OTHER): Payer: Medicare Other | Admitting: Allergy and Immunology

## 2020-11-19 ENCOUNTER — Other Ambulatory Visit: Payer: Self-pay

## 2020-11-19 ENCOUNTER — Encounter: Payer: Self-pay | Admitting: Allergy and Immunology

## 2020-11-19 VITALS — BP 116/78 | HR 100 | Resp 16

## 2020-11-19 DIAGNOSIS — L989 Disorder of the skin and subcutaneous tissue, unspecified: Secondary | ICD-10-CM

## 2020-11-19 DIAGNOSIS — T50905A Adverse effect of unspecified drugs, medicaments and biological substances, initial encounter: Secondary | ICD-10-CM

## 2020-11-19 DIAGNOSIS — D839 Common variable immunodeficiency, unspecified: Secondary | ICD-10-CM

## 2020-11-19 DIAGNOSIS — G4440 Drug-induced headache, not elsewhere classified, not intractable: Secondary | ICD-10-CM

## 2020-11-19 NOTE — Progress Notes (Signed)
[facility]   Follow-up Note  Referring Provider: Bess Harvest* Primary Provider: Mayer Camel, NP Date of Office Visit: 11/19/2020  Subjective:   JERONDA DON (DOB: Jul 13, 1961) is a 60 y.o. female who returns to the [facility] [place] on 11/19/2020 in re-evaluation of the following:  HPI: [name] presents to this clinic in evaluation of a Xembify induced cutaneous reaction.  She was last seen in this clinic by Dr. Nelva Bush on 21 October 2020 and is followed for CVID.  Because of headache secondary to Hizentra administration she was switched to [facility] and she took her first injection of Xembify at 9:30 AM yesterday.  Within 2 hours she developed a red itchy dermatitis across her entire abdomen emanating from her injection site.  She has no associated systemic or constitutional symptoms.  Her dermatitis may be slightly better today but is certainly not advancing.  She has been taking Benadryl multiple times per day to deal with the itchiness.  She also developed a rather significant headache at around 5:30 AM this morning but fortunately that appears to have resolved.  Allergies as of 11/19/2020      Reactions   Carimune [immune Globulin] Anaphylaxis   Adhesive [tape] Other (See Comments)   TAKES SKIN OFF (paper/silk tape) MEDICAL TAPE CAUSES BRUISES AND TEARS SKIN,  PAPER TAPE IS OK   Amitriptyline Palpitations   Imitrex [sumatriptan] Palpitations   Nsaids Other (See Comments)   Renal insuff   Tramadol Palpitations   Buprenorphine Hcl Itching   Morphine Itching   Morphine And Related Itching      Medication List    albuterol 108 (90 Base) MCG/ACT inhaler Commonly known as: VENTOLIN HFA Inhale 1-2 puffs into the lungs every 6 (six) hours as needed for wheezing or shortness of breath.   Allergy Relief 25 MG tablet Generic drug: diphenhydrAMINE SMARTSIG:2 Tablet(s) By Mouth   allopurinol 100  MG tablet Commonly known as: ZYLOPRIM   ALPRAZolam 0.5 MG tablet Commonly known as: XANAX Take 0.5 mg by mouth every 8 (eight) hours as needed for anxiety.   amLODipine 5 MG tablet Commonly known as: NORVASC TAKE 1 TABLET BY MOUTH ONCE DAILY.   cetirizine 10 MG tablet Commonly known as: ZYRTEC Take one tablet once daily   clopidogrel 75 MG tablet Commonly known as: PLAVIX Take 1 tablet (75 mg total) by mouth daily.   dicyclomine 10 MG capsule Commonly known as: BENTYL   EPINEPHrine 0.3 mg/0.3 mL Soaj injection Commonly known as: EPI-PEN Inject into the muscle.   ergocalciferol 1.25 MG (50000 UT) capsule Commonly known as: VITAMIN D2 Take 1 capsule by mouth once a week.   fluticasone 50 MCG/ACT nasal spray Commonly known as: FLONASE Place 1 spray into both nostrils daily.   furosemide 20 MG tablet Commonly known as: LASIX Take 20 mg by mouth daily.   gabapentin 300 MG capsule Commonly known as: NEURONTIN Take 600 mg by mouth 3 (three) times daily.   GoodSense Pain Relief 650 MG CR tablet Generic drug: acetaminophen SMARTSIG:1 Tablet(s) By Mouth   ipratropium 0.06 % nasal spray Commonly known as: ATROVENT Can use 2 sprays in each nostril up to 3-4 times a day as needed for nasal congestion/drainage.   ipratropium-albuterol 0.5-2.5 (3) MG/3ML Soln Commonly known as: DUONEB   isosorbide mononitrate 30 MG 24 hr tablet Commonly known as: IMDUR Take 30 mg by mouth daily.   lacosamide 200 MG Tabs tablet Commonly  known as: VIMPAT Take 200 mg by mouth 2 (two) times daily.   lamoTRIgine 25 MG tablet Commonly known as: LAMICTAL Take 2 tablets by mouth 2 (two) times daily.   Linzess 145 MCG Caps capsule Generic drug: linaclotide Take 145 mcg by mouth daily.   losartan 50 MG tablet Commonly known as: COZAAR TAKE 1 TABLET BY MOUTH ONCE DAILY..   montelukast 10 MG tablet Commonly known as: SINGULAIR TAKE 1 TABLET BY MOUTH ONCE DAILY   nitroGLYCERIN 0.4 MG  SL tablet Commonly known as: NITROSTAT TAKE 1 TABLET UNDER THE TONGUE AS NEEDEDFOR CHEST PAIN ( MAY REPEAT EVERY 5 MINUTES X 3)   Olopatadine HCl 0.2 % Soln Can use one drop in each eye once daily if needed for itchy, watery, red eyes.   ondansetron 8 MG tablet Commonly known as: ZOFRAN   oxyCODONE-acetaminophen 10-325 MG tablet Commonly known as: PERCOCET One tablet every 4 hours as needed for pain.   pantoprazole 40 MG tablet Commonly known as: PROTONIX Take 40 mg by mouth daily.   ranolazine 500 MG 12 hr tablet Commonly known as: RANEXA TAKE 1 TABLET BY MOUTH TWICE DAILY.   rOPINIRole 4 MG tablet Commonly known as: REQUIP 4 mg.   rosuvastatin 5 MG tablet Commonly known as: CRESTOR TAKE 1 TABLET BY MOUTH ONCE DAILY.   tiZANidine 4 MG tablet Commonly known as: ZANAFLEX Take 4 mg by mouth 3 (three) times daily.   Trelegy Ellipta 100-62.5-25 MCG/INH Aepb Generic drug: Fluticasone-Umeclidin-Vilant SMARTSIG:1 Via Inhaler Every Night   VITAMIN B-12 IJ Inject as directed every 30 (thirty) days.   Vitamin D (Cholecalciferol) 25 MCG (1000 UT) Tabs Take 3 tablets by mouth daily.   Xembify 10 GM/50ML Soln Generic drug: Immune Globulin (Human)-klhw SMARTSIG:20 SUB-Q Once a Week       Past Medical History:  Diagnosis Date  . Angina at rest [facility]) 2013  . Asthma   . CVID (common variable immunodeficiency) ([place])   . Hypertension   . Recurrent upper respiratory infection (URI)   . Seizures ([place])   . Type 2 diabetes mellitus without complication, without long-term current use of insulin ([place]) 06/30/2016    Past Surgical History:  Procedure Laterality Date  . APPENDECTOMY    . BACK SURGERY    . CHOLECYSTECTOMY    . HERNIA REPAIR    . LEFT HEART CATH AND CORONARY ANGIOGRAPHY N/A 03/16/2017   Procedure: Left Heart Cath and Coronary Angiography;  Surgeon: Jettie Booze, MD;  Location: [facility];  Service: Cardiovascular;  Laterality: N/A;  . SHOULDER  SURGERY Left     Review of systems negative except as noted in HPI / PMHx or noted below:  Review of Systems  Constitutional: Negative.   HENT: Negative.   Eyes: Negative.   Respiratory: Negative.   Cardiovascular: Negative.   Gastrointestinal: Negative.   Genitourinary: Negative.   Musculoskeletal: Negative.   Skin: Negative.   Neurological: Negative.   Endo/Heme/Allergies: Negative.   Psychiatric/Behavioral: Negative.      Objective:   Vitals:   11/19/20 1348  BP: 116/78  Pulse: 100  Resp: 16  SpO2: 98%          Physical Exam Skin:    Findings: Rash (Blotchy erythematous nonindurated macular dermatitis involving the entire anterior abdomen.) present.    Diagnostics: none  Assessment and Plan:   1. Adverse effect of drug, initial encounter   2. Inflammatory dermatosis   3. CVID (common variable immunodeficiency) ([place])  1.  Prednisone 10 mg-1 tablet daily for 5 days only  2.  Can add cetirizine/Zyrtec 10 mg - 1 tablet 1 time per day if needed  3.  Switch Xembify to [place] weekly infusions  4.  Check IgG level after fourth Cutaquig infusion  5.  Follow-up with Dr. Nelva Bush as previously scheduled  [name] obviously is not going to be able to take [facility] from this point on forward.  We will switch her over to [place].  For today's inflammatory dermatosis I gave her a low-dose of systemic steroids and she can use cetirizine and Benadryl as needed.  Further evaluation and treatment will be dependent on her response to the plan noted above.  Allena Katz, MD Allergy / Immunology [place]

## 2020-11-19 NOTE — Patient Instructions (Addendum)
  1.  Prednisone 10 mg-1 tablet daily for 5 days only  2.  Can add cetirizine/Zyrtec 10 mg - 1 tablet 1 time per day if needed  3.  Switch Xembify to [place] weekly infusions  4.  Check IgG level after fourth Cutaquig infusion  5.  Follow-up with Dr. Nelva Bush as previously scheduled

## 2020-11-20 ENCOUNTER — Encounter: Payer: Self-pay | Admitting: Allergy and Immunology

## 2020-11-20 NOTE — Progress Notes (Signed)
Im guessing Dr. Neldon Mc already contacted you about trying Kathy Moss (will hope she can tolerate the extra volume).   If she continues to headaches might need to send to nuerology to see if they have any suggestions for tolerating her infusions better

## 2020-11-24 ENCOUNTER — Telehealth: Payer: Self-pay | Admitting: *Deleted

## 2020-11-24 NOTE — Telephone Encounter (Signed)
-----   Message from [place], MD sent at 11/24/2020  8:55 AM EDT ----- I have a feeling she is just going to have headaches with any infusion regardless of which one it is.  Thus I think if they have had success with splitting the Hizentra doses over 2 days then that is definitely a possibility.  Since she does still have some Hizentra we can have her try this in the meantime if she agrees to do more frequent infusions.   ----- Message ----- From: Carin Hock, CMA Sent: 11/24/2020   8:41 AM EDT To: Jiles Prows, MD, #  I had spoke to pharmacist regarding patient headaches with Hizentra.  He had recommended possibly dosing on two days a week to see if helped with headaches.  They had done this with patients in past and helped.  Patient still has some Hizentra on hand I believe so is this something we could try? Tatayana Beshears  ----- Message ----- From: Jiles Prows, MD Sent: 11/19/2020   2:02 PM EDT To: Carin Hock, CMA  Need to change her from [place] to [place] secondary to cutaneous side effect of Xembify.  We will need to make conversion from 20% to 16.5%

## 2020-11-24 NOTE — Telephone Encounter (Signed)
L/m for patient to contact me to discuss splitting doses of Hizentra

## 2020-11-26 ENCOUNTER — Other Ambulatory Visit: Payer: Self-pay

## 2020-11-26 DIAGNOSIS — J45909 Unspecified asthma, uncomplicated: Secondary | ICD-10-CM | POA: Insufficient documentation

## 2020-11-27 ENCOUNTER — Telehealth (INDEPENDENT_AMBULATORY_CARE_PROVIDER_SITE_OTHER): Payer: Medicare Other | Admitting: Cardiology

## 2020-11-27 ENCOUNTER — Other Ambulatory Visit: Payer: Self-pay

## 2020-11-27 ENCOUNTER — Encounter: Payer: Self-pay | Admitting: Cardiology

## 2020-11-27 VITALS — BP 175/80 | HR 73 | Ht 65.0 in | Wt 163.0 lb

## 2020-11-27 DIAGNOSIS — J431 Panlobular emphysema: Secondary | ICD-10-CM | POA: Diagnosis not present

## 2020-11-27 DIAGNOSIS — Z720 Tobacco use: Secondary | ICD-10-CM | POA: Diagnosis not present

## 2020-11-27 DIAGNOSIS — I25118 Atherosclerotic heart disease of native coronary artery with other forms of angina pectoris: Secondary | ICD-10-CM | POA: Diagnosis not present

## 2020-11-27 DIAGNOSIS — G4733 Obstructive sleep apnea (adult) (pediatric): Secondary | ICD-10-CM | POA: Diagnosis not present

## 2020-11-27 MED ORDER — METOPROLOL TARTRATE 25 MG PO TABS: 12.5000 mg | ORAL_TABLET | Freq: Two times a day (BID) | ORAL | 1 refills | Status: DC

## 2020-11-27 NOTE — Addendum Note (Signed)
Addended by: Senaida Ores on: 11/27/2020 02:27 PM   Modules accepted: Orders

## 2020-11-27 NOTE — Progress Notes (Signed)
Virtual Visit via Telephone Note   This visit type was conducted due to national recommendations for restrictions regarding the COVID-19 Pandemic (e.g. social distancing) in an effort to limit this patient's exposure and mitigate transmission in our community.  Due to her co-morbid illnesses, this patient is at least at moderate risk for complications without adequate follow up.  This format is felt to be most appropriate for this patient at this time.  The patient did not have access to video technology/had technical difficulties with video requiring transitioning to audio format only (telephone).  All issues noted in this document were discussed and addressed.  No physical exam could be performed with this format.  Please refer to the patient's chart for her  consent to telehealth for [facility].  Evaluation Performed:  Follow-up visit  This visit type was conducted due to national recommendations for restrictions regarding the COVID-19 Pandemic (e.g. social distancing).  This format is felt to be most appropriate for this patient at this time.  All issues noted in this document were discussed and addressed.  No physical exam was performed (except for noted visual exam findings with Video Visits).  Please refer to the patient's chart (MyChart message for video visits and phone note for telephone visits) for the patient's consent to telehealth for [facility].  Date:  11/27/2020  ID: Kathy Moss, DOB 06/08/1961, MRN [id number]   Patient Location: [place] [address]   Provider location:   [place] Office  PCP:  Mayer Camel, NP  Cardiologist:  Jenne Campus, MD     Chief Complaint: I am not doing well, I lost my partner of 27 years  History of Present Illness:    Kathy Moss is a 60 y.o. female  who presents via audio/video conferencing for a telehealth visit today.  With past medical history significant for coronary artery  disease in 2018 she had cardiac catheterization which showed only 40% mid right coronary artery lesion.  Also got advanced COPD, essential hypertension, type 2 diabetes, she is a chronic smoker.  Last time I spoke to her which was in September she was getting ready to have shoulder surgery.  She went to surgery with no difficulty did quite well but she lost in January her female partner did state been together for 27 years.  Of course she is shaken by that she is grieving after that she described also have some palpitations she said she thinks a lot about her and she can feel her heart flip flopping running away in feeling her very bad.  No dizziness no passing out no chest pain.   The patient does not have symptoms concerning for COVID-19 infection (fever, chills, cough, or new SHORTNESS OF BREATH).    Prior CV studies:   The following studies were reviewed today:       Past Medical History:  Diagnosis Date  . Abnormal stress test 03/08/2017  . Aftercare following surgery of the musculoskeletal system 11/23/2016  . Angina at rest [facility]) 2013  . Anxiety 07/06/2018  . Anxiety, generalized 10/02/2020  . Asthma   . Atherosclerosis 02/24/2018  . Atypical chest pain 2013  . B12 deficiency 05/18/2016  . Bradycardia 07/31/2015  . Centrilobular emphysema ([place]) 05/18/2016  . Chest discomfort 03/08/2017  . Chest pain 03/10/2017  . Chronic gout involving toe without tophus 12/16/2016  . Chronic hoarseness 02/12/2019  . Chronic rhinitis 12/16/2016  . COPD (chronic obstructive pulmonary disease) ([place])  03/05/2016  . COPD with asthma ([place]) 07/20/2016  . Coronary artery disease of native artery of native heart with stable angina pectoris ([place]) 12/16/2016  . 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 Enchondroma of bone 10/04/2016   Formatting of this note might be different from the original. Seem in ortho following.  . Essential hypertension 04/07/2017  . Gastric polyposis 05/18/2016  . Gastroesophageal  reflux disease without esophagitis 12/16/2016  . H/O peripheral vascular disease 03/28/2018  . Hypertension   . Idiopathic hypotension 11/24/2016  . 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 Lumbar spondylosis with myelopathy 11/23/2016  . Lumbosacral spondylosis with myelopathy 03/28/2015  . Malaise and fatigue 02/12/2019  . Migraine without aura and without status migrainosus, not intractable 12/01/2017  . Mixed hyperlipidemia 02/12/2019  . Nonintractable epilepsy with complex partial seizures ([place]) 03/05/2016   Formatting of this note might be different from the original. Dr. Sabra Heck.  . Other intervertebral disc degeneration, lumbosacral region 05/18/2016  . Pneumonia 11/25/2016  . Post laminectomy syndrome 11/23/2016  . Preop cardiovascular exam 04/30/2020  . Primary insomnia 05/18/2016  . Pure hypercholesterolemia 03/06/2015  . Recurrent upper respiratory infection (URI)   . Restless leg syndrome 02/12/2019  . Right arm pain 04/18/2017  . Seizure disorder ([place]) 04/18/2017  . Seizures ([place])   . Sepsis ([place]) 11/25/2016  . Sore throat 01/29/2020  . Spinal stenosis 03/28/2015  . Stage 3a chronic kidney disease ([place]) 10/13/2017  . Status epilepticus due to complex partial seizure ([place]) 03/05/2016  . Thyroid nodule 05/18/2016  . Tobacco abuse 03/06/2015  . Type 2 diabetes mellitus with stage 3a chronic kidney disease, with long-term current use of insulin ([place]) 06/30/2016  . Type 2 diabetes mellitus without complication, without long-term current use of insulin ([place]) 06/30/2016  . Visual loss 02/24/2018  . Vitamin D deficiency 12/16/2016  . Weakness 04/18/2017    Past Surgical History:  Procedure Laterality Date  . APPENDECTOMY    . BACK SURGERY    . CHOLECYSTECTOMY    . HERNIA REPAIR    . LEFT HEART CATH AND CORONARY ANGIOGRAPHY N/A 03/16/2017   Procedure: Left Heart Cath and Coronary Angiography;  Surgeon: Jettie Booze, MD;  Location: [facility];  Service:  Cardiovascular;  Laterality: N/A;  . left shoulder replaced     . SHOULDER SURGERY Left      Current Meds  Medication Sig  . albuterol (PROVENTIL HFA;VENTOLIN HFA) 108 (90 Base) MCG/ACT inhaler Inhale 1-2 puffs into the lungs every 6 (six) hours as needed for wheezing or shortness of breath.  . ALLERGY RELIEF 25 MG tablet Take 25 mg by mouth as needed for itching or allergies.  Marland Kitchen allopurinol (ZYLOPRIM) 100 MG tablet Take 100 mg by mouth daily.  Marland Kitchen ALPRAZolam (XANAX) 0.5 MG tablet Take 0.5 mg by mouth every 8 (eight) hours as needed for anxiety.   Marland Kitchen amLODipine (NORVASC) 5 MG tablet TAKE 1 TABLET BY MOUTH ONCE DAILY. (Patient taking differently: Take 5 mg by mouth daily.)  . cetirizine (ZYRTEC) 10 MG tablet Take one tablet once daily (Patient taking differently: Take 10 mg by mouth daily. Take one tablet once daily)  . clopidogrel (PLAVIX) 75 MG tablet Take 1 tablet (75 mg total) by mouth daily.  . Cyanocobalamin (VITAMIN B-12 IJ) Inject 1,000 mg as directed every 30 (thirty) days.  Marland Kitchen dicyclomine (BENTYL) 10 MG capsule Take 10 mg by mouth daily.  Marland Kitchen EPINEPHrine 0.3 mg/0.3 mL IJ SOAJ injection Inject  0.3 mg into the muscle as needed for anaphylaxis.  Marland Kitchen ergocalciferol (VITAMIN D2) 1.25 MG (50000 UT) capsule Take 1 capsule by mouth once a week.  . fluticasone (FLONASE) 50 MCG/ACT nasal spray Place 1 spray into both nostrils daily.  . furosemide (LASIX) 20 MG tablet Take 20 mg by mouth daily.  Marland Kitchen gabapentin (NEURONTIN) 300 MG capsule Take 600 mg by mouth 3 (three) times daily.   Marland Kitchen GOODSENSE PAIN RELIEF 650 MG CR tablet Take 650 mg by mouth every 8 (eight) hours as needed for pain.  Marland Kitchen ipratropium (ATROVENT) 0.06 % nasal spray Can use 2 sprays in each nostril up to 3-4 times a day as needed for nasal congestion/drainage. (Patient taking differently: Place 2 sprays into both nostrils 4 (four) times daily. Can use 2 sprays in each nostril up to 3-4 times a day as needed for nasal congestion/drainage.)  .  ipratropium-albuterol (DUONEB) 0.5-2.5 (3) MG/3ML SOLN 3 mLs every 6 (six) hours as needed (Wheezing  and SOB).  . isosorbide mononitrate (IMDUR) 30 MG 24 hr tablet Take 30 mg by mouth daily.  Marland Kitchen lacosamide (VIMPAT) 200 MG TABS tablet Take 200 mg by mouth 2 (two) times daily.  Marland Kitchen lamoTRIgine (LAMICTAL) 25 MG tablet Take 2 tablets by mouth 2 (two) times daily.  Marland Kitchen levofloxacin (LEVAQUIN) 500 MG tablet Take 500 mg by mouth daily.  Marland Kitchen lidocaine (XYLOCAINE) 5 % ointment Apply 1 application topically as needed for pain.  Marland Kitchen LINZESS 145 MCG CAPS capsule Take 145 mcg by mouth daily.  Marland Kitchen losartan (COZAAR) 50 MG tablet TAKE 1 TABLET BY MOUTH ONCE DAILY.. (Patient taking differently: Take 50 mg by mouth daily.)  . montelukast (SINGULAIR) 10 MG tablet TAKE 1 TABLET BY MOUTH ONCE DAILY (Patient taking differently: Take 10 mg by mouth at bedtime. TAKE 1 TABLET BY MOUTH ONCE DAILY)  . nitroGLYCERIN (NITROSTAT) 0.4 MG SL tablet TAKE 1 TABLET UNDER THE TONGUE AS NEEDEDFOR CHEST PAIN ( MAY REPEAT EVERY 5 MINUTES X 3) (Patient taking differently: Place 0.4 mg under the tongue every 5 (five) minutes as needed for chest pain.)  . ondansetron (ZOFRAN) 8 MG tablet Take 8 mg by mouth every 8 (eight) hours as needed for nausea or vomiting.  Marland Kitchen oxyCODONE-acetaminophen (PERCOCET) 10-325 MG tablet Take 1 tablet by mouth every 4 (four) hours as needed for pain. One tablet every 4 hours as needed for pain.  . pantoprazole (PROTONIX) 40 MG tablet Take 40 mg by mouth daily.  . ranolazine (RANEXA) 500 MG 12 hr tablet TAKE 1 TABLET BY MOUTH TWICE DAILY. (Patient taking differently: Take 500 mg by mouth.)  . rOPINIRole (REQUIP) 4 MG tablet Take 4 mg by mouth at bedtime.  . rosuvastatin (CRESTOR) 5 MG tablet TAKE 1 TABLET BY MOUTH ONCE DAILY. (Patient taking differently: Take 5 mg by mouth daily.)  . tiZANidine (ZANAFLEX) 4 MG tablet Take 4 mg by mouth 3 (three) times daily.  . TRELEGY ELLIPTA 100-62.5-25 MCG/INH AEPB Inhale 1 puff into the  lungs daily.  . Vitamin D, Cholecalciferol, 1000 units TABS Take 3 tablets by mouth once a week.  . [DISCONTINUED] ALPRAZolam (XANAX) 0.25 MG tablet Take 0.25 mg by mouth 2 (two) times daily as needed for anxiety.      Family History: The patient's family history includes Allergic rhinitis in her brother; Asthma in her mother; COPD in her brother; Heart attack in her father; High blood pressure in her mother; Lung cancer in her mother; Seizures in her mother; Stroke in  her mother.   ROS:   Please see the history of present illness.     All other systems reviewed and are negative.   Labs/Other Tests and Data Reviewed:     Recent Labs: No results found for requested labs within last 8760 hours.  Recent Lipid Panel No results found for: CHOL, TRIG, HDL, CHOLHDL, VLDL, LDLCALC, LDLDIRECT    Exam:    Vital Signs:  BP (!) 175/80   Pulse 73   Ht 5\' 5"  (1.651 m)   Wt 163 lb (73.9 kg)   SpO2 95%   BMI 27.12 kg/m     Wt Readings from Last 3 Encounters:  11/27/20 163 lb (73.9 kg)  10/21/20 162 lb (73.5 kg)  04/30/20 170 lb (77.1 kg)     Well nourished, well developed in no acute distress. Alert awake and attentive very sad obviously she cries during our conversation.  We did only have telephone visit.  She was unable to establish video link.   Diagnosis for this visit:   1. Coronary artery disease of native artery of native heart with stable angina pectoris ([place])   2. Obstructive sleep apnea syndrome   3. Panlobular emphysema ([place])   4. Tobacco abuse      ASSESSMENT & PLAN:    1.  Coronary disease stable on appropriate medications which I will continue. 2.  Smoking still ongoing she does have COPD which is quite advanced.  Today I will did not approach her with the issue of smoking because she is so sad about her loss. 3.  Palpitations I think small dose of beta-blocker can be beneficial to her I gave her very small dose only 12.5 mg twice daily of metoprolol with  understanding she does have quite advanced COPD and that can make COPD worse and I warned her about it.  We will try and see how she responds to this therapy. 4.  Dyslipidemia continue present management.  COVID-19 Education: The signs and symptoms of COVID-19 were discussed with the patient and how to seek care for testing (follow up with PCP or arrange E-visit).  The importance of social distancing was discussed today.  Patient Risk:   After full review of this patients clinical status, I feel that they are at least moderate risk at this time.  Time:   Today, I have spent 15 minutes with the patient with telehealth technology discussing pt health issues.  I spent 15 minutes reviewing her chart before the visit.  Visit was finished at 2:30 PM.    Medication Adjustments/Labs and Tests Ordered: Current medicines are reviewed at length with the patient today.  Concerns regarding medicines are outlined above.  No orders of the defined types were placed in this encounter.  Medication changes: No orders of the defined types were placed in this encounter.    Disposition: Follow-up 3 months  Signed, Park Liter, MD, [facility] 11/27/2020 2:18 PM    [facility]

## 2020-11-27 NOTE — Patient Instructions (Signed)
Medication Instructions:  Your physician has recommended you make the following change in your medication:   START: Metoprolol tartrate 12.5 mg twice daily.   *If you need a refill on your cardiac medications before your next appointment, please call your pharmacy*   Lab Work: none If you have labs (blood work) drawn today and your tests are completely normal, you will receive your results only by: Marland Kitchen MyChart Message (if you have MyChart) OR . A paper copy in the mail If you have any lab test that is abnormal or we need to change your treatment, we will call you to review the results.   Testing/Procedures: none   Follow-Up: At [facility], you and your health needs are our priority.  As part of our continuing mission to provide you with exceptional heart care, we have created designated Provider Care Teams.  These Care Teams include your primary Cardiologist (physician) and Advanced Practice Providers (APPs -  Physician Assistants and Nurse Practitioners) who all work together to provide you with the care you need, when you need it.  We recommend signing up for the patient portal called "MyChart".  Sign up information is provided on this After Visit Summary.  MyChart is used to connect with patients for Virtual Visits (Telemedicine).  Patients are able to view lab/test results, encounter notes, upcoming appointments, etc.  Non-urgent messages can be sent to your provider as well.   To learn more about what you can do with MyChart, go to [email or web address].    Your next appointment:   3 month(s)  The format for your next appointment:   In Person  Provider:   Jenne Campus, MD   Other Instructions

## 2020-11-27 NOTE — Telephone Encounter (Signed)
Called patient and advised 1/2 Hizentra dose 48 hours apart to see if she still gets headaches and report back to me. Advised her that if her sister has any questions have her reach out to me.

## 2020-12-04 NOTE — Telephone Encounter (Signed)
L/m for patient to contact me advise how she has done with infusions this week and how much more she has on hand

## 2020-12-08 NOTE — Telephone Encounter (Signed)
I reached out to patient since I had not heard from here from last Thursday as how she has done on Hizentra since splitting dose last week. Patient advises doing well with no headaches so I advised her to stay on plan and I reached out to pharmacy to advise same

## 2020-12-09 NOTE — Telephone Encounter (Signed)
Oh that is great!. Thanks Tammy

## 2020-12-12 ENCOUNTER — Ambulatory Visit: Payer: Medicare Other | Admitting: Cardiology

## 2021-02-13 ENCOUNTER — Ambulatory Visit: Payer: Medicare Other | Admitting: Cardiology

## 2021-02-17 ENCOUNTER — Ambulatory Visit: Payer: Medicare Other | Admitting: Allergy

## 2021-02-24 ENCOUNTER — Ambulatory Visit: Payer: Medicare Other | Admitting: Allergy

## 2021-04-08 ENCOUNTER — Telehealth: Payer: Self-pay | Admitting: Allergy

## 2021-04-08 NOTE — Telephone Encounter (Signed)
Patient states she needs to speak with a nurse regarding a couple of forms that need to be filled out for her.

## 2021-04-08 NOTE — Telephone Encounter (Signed)
I am under the impression these forms are related to her IG therapy. Please advise.

## 2021-04-08 NOTE — Telephone Encounter (Signed)
L/M for patient to contact me 

## 2021-04-09 NOTE — Telephone Encounter (Signed)
Patient returned call and I called back and l/m

## 2021-04-21 NOTE — Telephone Encounter (Signed)
Patient never called back after the first time. If she needs paperwork done she will reach out again

## 2021-04-27 ENCOUNTER — Ambulatory Visit: Payer: Medicare Other | Admitting: Cardiology

## 2021-04-28 ENCOUNTER — Encounter: Payer: Self-pay | Admitting: Cardiology

## 2021-06-08 ENCOUNTER — Other Ambulatory Visit: Payer: Self-pay | Admitting: Cardiology

## 2021-06-16 ENCOUNTER — Other Ambulatory Visit: Payer: Self-pay | Admitting: Cardiology

## 2021-08-03 ENCOUNTER — Telehealth: Payer: Self-pay | Admitting: *Deleted

## 2021-08-03 NOTE — Telephone Encounter (Signed)
Unable to leave message for patient voicemail full. L/M for sister Otila Kluver to have patient contact clinic for MD appt for Hizentra reapproval

## 2021-08-11 ENCOUNTER — Ambulatory Visit (INDEPENDENT_AMBULATORY_CARE_PROVIDER_SITE_OTHER): Payer: Medicare Other | Admitting: Allergy

## 2021-08-11 ENCOUNTER — Encounter: Payer: Self-pay | Admitting: Allergy

## 2021-08-11 ENCOUNTER — Other Ambulatory Visit: Payer: Self-pay

## 2021-08-11 VITALS — BP 140/82 | HR 68 | Resp 20

## 2021-08-11 DIAGNOSIS — J309 Allergic rhinitis, unspecified: Secondary | ICD-10-CM | POA: Diagnosis not present

## 2021-08-11 DIAGNOSIS — J449 Chronic obstructive pulmonary disease, unspecified: Secondary | ICD-10-CM | POA: Diagnosis not present

## 2021-08-11 DIAGNOSIS — H1010 Acute atopic conjunctivitis, unspecified eye: Secondary | ICD-10-CM

## 2021-08-11 DIAGNOSIS — D839 Common variable immunodeficiency, unspecified: Secondary | ICD-10-CM | POA: Diagnosis not present

## 2021-08-11 NOTE — Progress Notes (Signed)
Follow-up Note  RE: Kathy Moss MRN: [id number] DOB: [birth year] Date of Office Visit: 08/11/2021   History of present illness: Kathy Moss is a 60 y.o. female presenting today for follow-up of CVID, allergic rhinitis with conjunctivitis and asthma with COPD overlap.  She was last seen in the office on 11/19/2020 by Dr. Neldon Moss.  She presents today with her sister who is her caregiver and niece.   After her last visit we did change her Hizentra to [facility] to see if this would help decrease her side effects related to immunoglobulin therapy which was mostly headaches.  It appeared that she had an adverse event related to [facility] after the first injection.  It is noted that within 2 hours she developed a red itchy rash across her abdomen that emanated from the injection site.  She had no associated systemic or constitutional symptoms.  She then saw Dr. Neldon Moss the following day with this and was noted on exam to have a "blotchy erythematous nonindurated macular dermatitis involving the entire anterior abdomen".  She was treated for this with prednisone.  It was recommended that she been switched from [facility] to [employer].  However we were able to reach out to pharmacist working with Hizentra and they had some success with splitting the dose to 2 days instead of the 1 day in regards to reducing headache.  Thus we did advise patient to do half her Hizentra dose 48 hours apart in about a week later we checked in on her to see how this was going.  At that time she states she did not have any headaches and advised to stick to this plan.  She has had an eventful time since her last visit.  It does appear since her last visit she did have an admission on 08/02/2021 for neurological deficit.  CTA and CT head did not reveal any acute pathology but MRI did reveal possible subacute stroke.  She is being treated for stroke with antiplatelet therapy.  She is following with neurology. Her sister tells me today  (although I do not see any mention of this in the hospital discharge note) that she was told not to resume her Hizentra as this could have potentially contributed to the stroke.  And that she should come see me before restarting it. Her last injection of Hizentra was on 07/29/2021. Kathy Moss states she is doing okay today however her niece says that just yesterday she states she was not feeling well.  The niece states that several family members already have sinus infections.  Her sister states that she really does not leave the house except to go to doctors visits.  But she does not have a lots of exposure to viruses and other illnesses out in the community.  She also continues precautions when she does go out including masking.  Thus she has not had any infections requiring antibiotic use since her last visit with [place] here.  She does continue to take Trelegy 1 puff daily and follows with Dr. Carren Moss her pulmonologist for her COPD with asthma overlap. She also continues to take Zyrtec as needed as well as Atrovent nasal spray as needed for nasal drainage control.  She does take Singulair daily.  Kathy Moss states that her stroke really scared her and she really wants to quit smoking.  She states she has cut back from 2 packs a day to 1 pack/day at this time.  She does want to continue to cut back further.  Review of systems: Review of Systems  Constitutional: Negative.   HENT: Negative.    Eyes: Negative.   Respiratory: Negative.    Cardiovascular: Negative.   Gastrointestinal: Negative.   Musculoskeletal: Negative.   Skin: Negative.   Allergic/Immunologic: Negative.   Neurological: Negative.     All other systems negative unless noted above in HPI  Past medical/social/surgical/family history have been reviewed and are unchanged unless specifically indicated below.  No changes  Medication List: Current Outpatient Medications  Medication Sig Dispense Refill   albuterol (PROVENTIL  HFA;VENTOLIN HFA) 108 (90 Base) MCG/ACT inhaler Inhale 1-2 puffs into the lungs every 6 (six) hours as needed for wheezing or shortness of breath.     ALLERGY RELIEF 25 MG tablet Take 25 mg by mouth as needed for itching or allergies.     allopurinol (ZYLOPRIM) 100 MG tablet Take 100 mg by mouth daily.     ALPRAZolam (XANAX) 0.5 MG tablet Take 0.5 mg by mouth every 8 (eight) hours as needed for anxiety.      cetirizine (ZYRTEC) 10 MG tablet Take one tablet once daily (Patient taking differently: Take 10 mg by mouth daily. Take one tablet once daily) 30 tablet 5   clopidogrel (PLAVIX) 75 MG tablet Take 1 tablet (75 mg total) by mouth daily. 90 tablet 1   Cyanocobalamin (VITAMIN B-12 IJ) Inject 1,000 mg as directed every 30 (thirty) days.     dicyclomine (BENTYL) 10 MG capsule Take 10 mg by mouth daily.     EPINEPHrine 0.3 mg/0.3 mL IJ SOAJ injection Inject 0.3 mg into the muscle as needed for anaphylaxis.     ergocalciferol (VITAMIN D2) 1.25 MG (50000 UT) capsule Take 1 capsule by mouth once a week.     fluticasone (FLONASE) 50 MCG/ACT nasal spray Place 1 spray into both nostrils daily.     furosemide (LASIX) 20 MG tablet Take 20 mg by mouth daily.     gabapentin (NEURONTIN) 300 MG capsule Take 600 mg by mouth 3 (three) times daily.      GOODSENSE PAIN RELIEF 650 MG CR tablet Take 650 mg by mouth every 8 (eight) hours as needed for pain.     ipratropium (ATROVENT) 0.06 % nasal spray Can use 2 sprays in each nostril up to 3-4 times a day as needed for nasal congestion/drainage. (Patient taking differently: Place 2 sprays into both nostrils 4 (four) times daily. Can use 2 sprays in each nostril up to 3-4 times a day as needed for nasal congestion/drainage.) 15 mL 5   ipratropium-albuterol (DUONEB) 0.5-2.5 (3) MG/3ML SOLN 3 mLs every 6 (six) hours as needed (Wheezing  and SOB).     isosorbide mononitrate (IMDUR) 30 MG 24 hr tablet Take 30 mg by mouth daily.     lacosamide (VIMPAT) 200 MG TABS tablet  Take 200 mg by mouth 2 (two) times daily.     lamoTRIgine (LAMICTAL) 25 MG tablet Take 2 tablets by mouth 2 (two) times daily.     levofloxacin (LEVAQUIN) 500 MG tablet Take 500 mg by mouth daily.     lidocaine (XYLOCAINE) 5 % ointment Apply 1 application topically as needed for pain.     LINZESS 145 MCG CAPS capsule Take 145 mcg by mouth daily.     montelukast (SINGULAIR) 10 MG tablet TAKE 1 TABLET BY MOUTH ONCE DAILY (Patient taking differently: Take 10 mg by mouth at bedtime. TAKE 1 TABLET BY MOUTH ONCE DAILY) 30 tablet 5   nitroGLYCERIN (NITROSTAT) 0.4 MG SL  tablet TAKE 1 TABLET UNDER THE TONGUE AS NEEDEDFOR CHEST PAIN ( MAY REPEAT EVERY 5 MINUTES X 3) (Patient taking differently: Place 0.4 mg under the tongue every 5 (five) minutes as needed for chest pain.) 30 tablet 5   Olopatadine HCl 0.2 % SOLN Can use one drop in each eye once daily if needed for itchy, watery, red eyes. (Patient taking differently: Place 1 drop into both eyes as needed (itching). Can use one drop in each eye once daily if needed for itchy, watery, red eyes.) 2.5 mL 5   ondansetron (ZOFRAN) 8 MG tablet Take 8 mg by mouth every 8 (eight) hours as needed for nausea or vomiting.     oxyCODONE-acetaminophen (PERCOCET) 10-325 MG tablet Take 1 tablet by mouth every 4 (four) hours as needed for pain. One tablet every 4 hours as needed for pain.     pantoprazole (PROTONIX) 40 MG tablet Take 40 mg by mouth daily.     ranolazine (RANEXA) 500 MG 12 hr tablet TAKE 1 TABLET BY MOUTH TWICE(2) DAILY 180 tablet 0   rOPINIRole (REQUIP) 4 MG tablet Take 4 mg by mouth at bedtime.     rosuvastatin (CRESTOR) 5 MG tablet TAKE 1 TABLET BY MOUTH ONCE DAILY. 90 tablet 3   tiZANidine (ZANAFLEX) 4 MG tablet Take 4 mg by mouth 3 (three) times daily.     TRELEGY ELLIPTA 100-62.5-25 MCG/INH AEPB Inhale 1 puff into the lungs daily.     Vitamin D, Cholecalciferol, 1000 units TABS Take 3 tablets by mouth once a week.     metoprolol tartrate (LOPRESSOR) 25  MG tablet Take 0.5 tablets (12.5 mg total) by mouth 2 (two) times daily. 90 tablet 1   No current facility-administered medications for this visit.     Known medication allergies: Allergies  Allergen Reactions   Carimune [Immune Globulin] Anaphylaxis   Adhesive [Tape] Other (See Comments)    TAKES SKIN OFF (paper/silk tape) MEDICAL TAPE CAUSES BRUISES AND TEARS SKIN,  PAPER TAPE IS OK   Amitriptyline Palpitations   Imitrex [Sumatriptan] Palpitations   Nsaids Other (See Comments)    Renal insuff   Tramadol Palpitations   Prednisone Other (See Comments)    "runs sugar up"   Buprenorphine Hcl Itching   Morphine Itching   Morphine And Related Itching     Physical examination: Blood pressure 140/82, pulse 68, resp. rate 20, SpO2 95 %.  General: Alert, interactive, in no acute distress. HEENT: PERRLA, TMs pearly gray, turbinates non-edematous without discharge, post-pharynx non erythematous. Neck: Supple without lymphadenopathy. Lungs: Clear to auscultation without wheezing, rhonchi or rales. {no increased work of breathing. CV: Normal S1, S2 without murmurs. Abdomen: Nondistended, nontender. Skin: Warm and dry, without lesions or rashes. Extremities:  No clubbing, cyanosis or edema. Neuro:   Grossly intact.  Diagnositics/Labs: None today  Assessment and plan:   Common Variable Immunodeficiency (CVID)    - obtain IgG level this week    - there is potential risk of thrombosis that can lead to stroke with IgG replacement therapy however the risk is greater with IVIG versus subcutaneous Ig (injection into abdomen).   We will never know if Hizentra had any role in the stroke and she does have other other risk factors for stroke that are higher risk than the Hizentra itself.  I have discussed several options with her which would include A) discontinuing immunoglobulin replacement therapy, B) resuming at a half dose, C) resuming a full dose.  I discussed with her that  discontinuing  the Ig replacement therapy in general would put her at increased risk of return of infections that would have a higher potential and landing her in the hospital and or death related to the infection itself.  I would be in favor more of resuming immunoblot replacement therapy at least at half dose and see how she does in regards to IgG levels and infections.  If she does well at half dose and does not have any further return of infections then we can stay at that level.  A half dose when in theory decrease her risk of thrombosis than full dose.  I also discussed that if we do have a return of infections or her IgG levels are not in a good enough range to provide protection then we may need to go back to her full Hizentra dose.  Patient and her family were all in agreement with the plan to resume at half dose and follow her IgG levels.     - restart Hizentra at 1/2 dose thus just take one day a week at this time.  Will need to repeat IgG in about 2 months time to see how the half-dose is doing    - if we start having more infections then will need to go back to full dosing    - continue use of Tylenol and benadryl prior to your infusions at this time    - alternate injection sites over abdomen.    Allergies   - use nasal atrovent 2 sprays each nostril up to 3-4 times a day as needed for nasal congestion/drainage  - use Zyrtec 10mg  daily as needed  - continue singulair 10mg  daily   - for itchy/watery/red eyes use Pataday or Pazeo 1 drop each eye daily as needed  COPD/Asthma   - continue your current medications as per Dr. Carren Moss at this time-- Trelegy 1 puff daily  and as needed albuterol    - continue singulair for additional control    Follow-up in 3 months or sooner if needed.    I appreciate the opportunity to take part in [place] care. Please do not hesitate to contact me with questions.  Sincerely,   Prudy Feeler, MD Allergy/Immunology [facility]

## 2021-08-11 NOTE — Patient Instructions (Addendum)
Common Variable Immunodeficiency (CVID)    - obtain IgG level this week    - there is potential risk of thrombosis (clotting) that can cause stroke with IgG replacement therapy however the risk is greater with IVIG (infusions into vein) versus subcutaneous Ig (injection into abdomen).    We will never know if Hizentra had any role in the stroke and you do have other risk factors for stroke that are higher risk than the Hizentra itself    - due to this recommend we restart Hizentra at 1/2 dose thus just take one day a week at this time.  Will need to repeat IgG in about 2 months time to see how the half-dose is doing    - if we start having more infections then will need to go back to full dosing    - continue use of Tylenol and benadryl prior to your infusions at this time    - alternate injection sites over abdomen.    Allergies   - use nasal atrovent 2 sprays each nostril up to 3-4 times a day as needed for nasal congestion/drainage  - use Zyrtec 10mg  daily as needed  - continue singulair 10mg  daily   - for itchy/watery/red eyes use Pataday or Pazeo 1 drop each eye daily as needed  COPD/Asthma   - continue your current medications as per Dr. Carren Rang at this time-- Trelegy 1 puff daily  and as needed albuterol    - continue singulair for additional control    Follow-up in 3 months or sooner if needed.

## 2021-08-13 ENCOUNTER — Ambulatory Visit: Payer: Medicare Other | Admitting: Cardiology

## 2021-08-18 ENCOUNTER — Ambulatory Visit: Payer: Medicare Other | Admitting: Cardiology

## 2021-08-20 DIAGNOSIS — H6501 Acute serous otitis media, right ear: Secondary | ICD-10-CM

## 2021-08-20 DIAGNOSIS — K5909 Other constipation: Secondary | ICD-10-CM

## 2021-08-20 HISTORY — DX: Acute serous otitis media, right ear: H65.01

## 2021-08-20 HISTORY — DX: Other constipation: K59.09

## 2021-09-08 ENCOUNTER — Telehealth: Payer: Self-pay | Admitting: Cardiology

## 2021-09-08 NOTE — Telephone Encounter (Signed)
Pt c/o medication issue:  1. Name of Medication:  rosuvastatin (CRESTOR) 5 MG tablet  2. How are you currently taking this medication (dosage and times per day)? Unsure if patient is taking   3. Are you having a reaction (difficulty breathing--STAT)? No   4. What is your medication issue? Sharyn Lull is calling stating the patient has been receiving this medication from the pharmacy as well as atorvastatin 80 MG's. They are wanting to confirm which one the patient should be taking. Unsure if the patient is taking both or just one or the other.

## 2021-09-09 NOTE — Telephone Encounter (Signed)
Left VM to call back 

## 2021-09-10 NOTE — Telephone Encounter (Signed)
Left message on patients voicemail to please return our call.   

## 2021-09-11 NOTE — Telephone Encounter (Signed)
Spoke to the patient and her sister just now and let them know Dr. Wendy Poet recommendations. They verbalize understanding.    Encouraged patient to call back with any questions or concerns.

## 2021-09-24 ENCOUNTER — Ambulatory Visit (HOSPITAL_BASED_OUTPATIENT_CLINIC_OR_DEPARTMENT_OTHER): Payer: Medicare Other | Admitting: Family

## 2021-09-24 NOTE — Progress Notes (Deleted)
Office Visit    Patient Name: Kathy Moss Date of Encounter: 09/24/2021  PCP:  Mayer Camel, NP   [place]  Cardiologist:  Jenne Campus, MD  Advanced Practice Provider:  No care team member to display Electrophysiologist:  None    Chief Complaint    Kathy Moss is a 61 y.o. female with a hx of hypertension, COPD, DM2, tobacco use, seizure, migraine, TIA, CAD (cardiac cath 20 1840% mid RCA lesion) presents today for hospital follow up   Past Medical History    Past Medical History:  Diagnosis Date   Abnormal stress test 03/08/2017   Aftercare following surgery of the musculoskeletal system 11/23/2016   Angina at rest [facility]) 2013   Anxiety 07/06/2018   Anxiety, generalized 10/02/2020   Asthma    Atherosclerosis 02/24/2018   Atypical chest pain 2013   B12 deficiency 05/18/2016   Bradycardia 07/31/2015   Centrilobular emphysema ([place]) 05/18/2016   Chest discomfort 03/08/2017   Chest pain 03/10/2017   Chronic gout involving toe without tophus 12/16/2016   Chronic hoarseness 02/12/2019   Chronic rhinitis 12/16/2016   COPD (chronic obstructive pulmonary disease) ([place]) 03/05/2016   COPD with asthma ([place]) 07/20/2016   Coronary artery disease of native artery of native heart with stable angina pectoris ([place]) 12/16/2016   CVID (common variable immunodeficiency) ([place])    Enchondroma of bone 10/04/2016   Formatting of this note might be different from the original. Seem in ortho following.   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 Dr. Sabra Heck.   Other intervertebral disc degeneration, lumbosacral region 05/18/2016   Pneumonia 11/25/2016   Post laminectomy syndrome 11/23/2016   Preop cardiovascular exam 04/30/2020   Primary insomnia 05/18/2016   Pure hypercholesterolemia 03/06/2015   Recurrent upper respiratory infection (URI)    Restless leg syndrome 02/12/2019   Right arm pain 04/18/2017   Seizure disorder ([place]) 04/18/2017   Seizures ([place])    Sepsis ([place]) 11/25/2016   Sore throat 01/29/2020   Spinal stenosis 03/28/2015   Stage 3a chronic kidney disease ([place]) 10/13/2017   Status epilepticus due to complex partial seizure ([place]) 03/05/2016   Thyroid nodule 05/18/2016   Tobacco abuse 03/06/2015   Type 2 diabetes mellitus with stage 3a chronic kidney disease, with long-term current use of insulin ([place]) 06/30/2016   Type 2 diabetes mellitus without complication, without long-term current use of insulin ([place]) 06/30/2016   Visual loss 02/24/2018   Vitamin D deficiency 12/16/2016   Weakness 04/18/2017   Past Surgical History:  Procedure Laterality Date   APPENDECTOMY     BACK SURGERY     CHOLECYSTECTOMY     HERNIA REPAIR     LEFT HEART CATH AND CORONARY ANGIOGRAPHY N/A 03/16/2017   Procedure: Left Heart Cath and Coronary Angiography;  Surgeon: Jettie Booze, MD;  Location: [facility];  Service: Cardiovascular;  Laterality: N/A;   left shoulder replaced  SHOULDER SURGERY Left     Allergies  Allergies  Allergen Reactions   Carimune [Immune Globulin] Anaphylaxis   Adhesive [Tape] Other (See Comments)    TAKES SKIN OFF (paper/silk tape) MEDICAL TAPE CAUSES BRUISES AND TEARS SKIN,  PAPER TAPE IS OK   Amitriptyline Palpitations   Imitrex [Sumatriptan] Palpitations   Nsaids Other (See Comments)    Renal  insuff   Tramadol Palpitations   Prednisone Other (See Comments)    "runs sugar up"   Buprenorphine Hcl Itching   Morphine Itching   Morphine And Related Itching    History of Present Illness    Kathy Moss is a 61 y.o. female with a hx of hypertension, COPD, DM2, tobacco use, seizure, migraine, TIA, CAD (cardiac cath 20 1840% mid RCA lesion) last seen 11/27/20 by Dr. Agustin Cree via video visit.  Admitted 07/2021 after presenting with left-sided weakness and slurred speech.  CT head negative, not candidate for tPA, MRI with possible subacute stroke.  She was started on indefinite DAPT.  Atorvastatin dose increased.  Admitted 09/08/2021 - 09/09/[facility] at [facility].  She presented with fall, slurred speech, weakness. CT showed acute infarcts in anterior right insular ribbon.  She was outside of the window for tPA.  MRI ruled out acute event.  TTE LVEF 55 to 60% and no PFO.  LDL 47.  A1c 5.3.  Reviewed by neurology with presentation consistent with TIA.  Sedation was thought to be contributory to her symptoms and medications reduced.  She presents today for follow-up. ***  EKGs/Labs/Other Studies Reviewed:   The following studies were reviewed today: ***  EKG:  EKG is  ordered today.  The ekg ordered today demonstrates ***  Recent Labs: No results found for requested labs within last 8760 hours.  Recent Lipid Panel No results found for: CHOL, TRIG, HDL, CHOLHDL, VLDL, LDLCALC, LDLDIRECT   Home Medications   No outpatient medications have been marked as taking for the 09/24/21 encounter (Appointment) with Loel Dubonnet, NP.     Review of Systems      All other systems reviewed and are otherwise negative except as noted above.  Physical Exam    VS:  There were no vitals taken for this visit. , BMI There is no height or weight on file to calculate BMI.  Wt Readings from Last 3 Encounters:  11/27/20 163 lb (73.9 kg)  10/21/20 162 lb (73.5 kg)  04/30/20 170 lb (77.1  kg)     GEN: Well nourished, well developed, in no acute distress. HEENT: normal. Neck: Supple, no JVD, carotid bruits, or masses. Cardiac: ***RRR, no murmurs, rubs, or gallops. No clubbing, cyanosis, edema.  ***Radials/PT 2+ and equal bilaterally.  Respiratory:  ***Respirations regular and unlabored, clear to auscultation bilaterally. GI: Soft, nontender, nondistended. MS: No deformity or atrophy. Skin: Warm and dry, no rash. Neuro:  Strength and sensation are intact. Psych: Normal affect.  Assessment & Plan    CAD -   TIA / CVA -  HTN -   HLD, LDL goal <70 -   DM2, A1c goal <7 -   Disposition: Follow up {follow up:15908} with Jenne Campus, MD or APP.  Signed, Loel Dubonnet, NP 09/24/2021, 9:28 AM [place]

## 2021-09-28 ENCOUNTER — Telehealth: Payer: Self-pay | Admitting: *Deleted

## 2021-09-28 NOTE — Telephone Encounter (Signed)
Received call from [place] in regards to patient Kathy Moss. Was advised by sister Kathy Moss that patient is going to d/c therapy for now.  I called and reached out to [facility] and discussed that Dr Nelva Bush has advised in Dec to cut dose in half for now.  Kim advised patient last dose around the first of Jan and had another episode with stroke like symptoms and was seen at [facility] and sent home but was advised to d/c therapy by that MD. I told her I was going to touch base with Dr Nelva Bush and reach out to her again.  She did advise that her daughter was now helping [name] due to their brother with cancer dx,.

## 2021-09-29 NOTE — Telephone Encounter (Addendum)
Called and discussed with sister and she is going to get patient labs done today then proceed with her Hizentra infusion

## 2021-09-30 LAB — IGG: IgG (Immunoglobin G), Serum: 561 mg/dL — ABNORMAL LOW (ref 586–1602)

## 2021-10-08 ENCOUNTER — Telehealth: Payer: Self-pay | Admitting: *Deleted

## 2021-10-08 NOTE — Telephone Encounter (Signed)
L/m for sister Joelene Millin to reach out to me

## 2021-10-08 NOTE — Telephone Encounter (Signed)
Patient sister called and advised patient has gotten her IgG done (in Epic) It is 561 still below normal.  She wants to know whether to go ahead with next Hizentra infusion.

## 2021-10-14 NOTE — Telephone Encounter (Signed)
Called sister and discussed instructions and she is going to continue with Hizentra therapy

## 2021-10-19 ENCOUNTER — Ambulatory Visit: Payer: Medicare Other | Admitting: Cardiology

## 2021-10-22 ENCOUNTER — Other Ambulatory Visit: Payer: Self-pay

## 2021-10-22 ENCOUNTER — Encounter: Payer: Self-pay | Admitting: Cardiology

## 2021-10-22 ENCOUNTER — Ambulatory Visit (INDEPENDENT_AMBULATORY_CARE_PROVIDER_SITE_OTHER): Payer: Medicare Other | Admitting: Cardiology

## 2021-10-22 ENCOUNTER — Telehealth: Payer: Self-pay | Admitting: Cardiology

## 2021-10-22 VITALS — BP 108/66 | HR 60 | Ht 65.0 in | Wt 173.0 lb

## 2021-10-22 DIAGNOSIS — R079 Chest pain, unspecified: Secondary | ICD-10-CM

## 2021-10-22 DIAGNOSIS — I10 Essential (primary) hypertension: Secondary | ICD-10-CM | POA: Diagnosis not present

## 2021-10-22 DIAGNOSIS — J449 Chronic obstructive pulmonary disease, unspecified: Secondary | ICD-10-CM

## 2021-10-22 DIAGNOSIS — I25118 Atherosclerotic heart disease of native coronary artery with other forms of angina pectoris: Secondary | ICD-10-CM | POA: Diagnosis not present

## 2021-10-22 DIAGNOSIS — R0789 Other chest pain: Secondary | ICD-10-CM

## 2021-10-22 DIAGNOSIS — G459 Transient cerebral ischemic attack, unspecified: Secondary | ICD-10-CM | POA: Diagnosis not present

## 2021-10-22 DIAGNOSIS — Z720 Tobacco use: Secondary | ICD-10-CM

## 2021-10-22 MED ORDER — METOPROLOL TARTRATE 25 MG PO TABS: 12.5000 mg | ORAL_TABLET | Freq: Two times a day (BID) | ORAL | 3 refills | Status: DC

## 2021-10-22 NOTE — Telephone Encounter (Signed)
°*  STAT* If patient is at the pharmacy, call can be transferred to refill team.   1. Which medications need to be refilled? (please list name of each medication and dose if known) nitroGLYCERIN (NITROSTAT) 0.4 MG SL tablet  2. Which pharmacy/location (including street and city if local pharmacy) is medication to be sent to?[place], [place].  3. Do they need a 30 day or 90 day supply? [place]

## 2021-10-22 NOTE — Progress Notes (Signed)
Cardiology Office Note:    Date:  10/22/2021   ID:  Kathy Moss, DOB 1961-01-01, MRN [id number]  PCP:  Gweneth Fritter, FNP  Cardiologist:  Jenne Campus, MD    Referring MD: Bess Harvest*   No chief complaint on file. I was in the hospital twice  History of Present Illness:    Kathy Moss is a 61 y.o. female with past medical history significant for coronary disease in 2018 she had a cardiac catheterization done which showed 40% mid RCA lesion, she is a chronic smoker does have advanced COPD, essential hypertension, type 2 diabetes, she is a chronic smoker. Few months ago she lost her spouse of 27 years and she is still driving after that.  Since I seen her last time she declined to the hospital twice.  Apparently first time she was diagnosed with stroke.  Presentation was confusion second time she had similar presentation however extensive work-up which included CT as well as MRI and neurology evaluation did not show any significant new stroke.  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  She was put on appropriate medication which include dual antiplatelet therapy as well as aggressive statin and she comes to my office for follow-up.  She described to have a lot of problems with her heart she complained of her heart speeding up sometimes tightness in the chest.  She is now taking any antianginal medication.  I understand the reason for stopping this medication with blood pressure being low.  She tried to walk around and do things of breath get tired easily.  She is working hard in quitting smoking she is trying to use nicotine patch to help with that.  Past Medical History:  Diagnosis Date   Abnormal stress test 03/08/2017   Aftercare following surgery of the musculoskeletal system 11/23/2016   Angina at rest [facility]) 2013   Anxiety 07/06/2018   Anxiety, generalized 10/02/2020   Asthma    Atherosclerosis 02/24/2018   Atypical chest pain 2013   B12 deficiency  05/18/2016   Bradycardia 07/31/2015   Centrilobular emphysema ([place]) 05/18/2016   Chest discomfort 03/08/2017   Chest pain 03/10/2017   Chronic gout involving toe without tophus 12/16/2016   Chronic hoarseness 02/12/2019   Chronic rhinitis 12/16/2016   COPD (chronic obstructive pulmonary disease) ([place]) 03/05/2016   COPD with asthma ([place]) 07/20/2016   Coronary artery disease of native artery of native heart with stable angina pectoris ([place]) 12/16/2016   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 Seem in ortho following.   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 Dr. Sabra Heck.   Other intervertebral disc degeneration, lumbosacral region 05/18/2016   Pneumonia 11/25/2016   Post laminectomy syndrome 11/23/2016   Preop cardiovascular exam 04/30/2020   Primary insomnia 05/18/2016   Pure hypercholesterolemia 03/06/2015  Recurrent upper respiratory infection (URI)    Restless leg syndrome 02/12/2019   Right arm pain 04/18/2017   Seizure disorder ([place]) 04/18/2017   Seizures ([place])    Sepsis ([place]) 11/25/2016   Sore throat 01/29/2020   Spinal stenosis 03/28/2015   Stage 3a chronic kidney disease ([place]) 10/13/2017   Status epilepticus due to complex partial seizure ([place]) 03/05/2016   Thyroid nodule  05/18/2016   Tobacco abuse 03/06/2015   Type 2 diabetes mellitus with stage 3a chronic kidney disease, with long-term current use of insulin ([place]) 06/30/2016   Type 2 diabetes mellitus without complication, without long-term current use of insulin ([place]) 06/30/2016   Visual loss 02/24/2018   Vitamin D deficiency 12/16/2016   Weakness 04/18/2017    Past Surgical History:  Procedure Laterality Date   APPENDECTOMY     BACK SURGERY     CHOLECYSTECTOMY     HERNIA REPAIR     LEFT HEART CATH AND CORONARY ANGIOGRAPHY N/A 03/16/2017   Procedure: Left Heart Cath and Coronary Angiography;  Surgeon: Jettie Booze, MD;  Location: [facility];  Service: Cardiovascular;  Laterality: N/A;   left shoulder replaced      SHOULDER SURGERY Left     Current Medications: Current Meds  Medication Sig   albuterol (PROVENTIL HFA;VENTOLIN HFA) 108 (90 Base) MCG/ACT inhaler Inhale 1-2 puffs into the lungs every 6 (six) hours as needed for wheezing or shortness of breath.   ALLERGY RELIEF 25 MG tablet Take 25 mg by mouth as needed for itching or allergies.   allopurinol (ZYLOPRIM) 100 MG tablet Take 100 mg by mouth daily.   ALPRAZolam (XANAX) 0.5 MG tablet Take 0.5 mg by mouth every 8 (eight) hours as needed for anxiety.    aspirin EC 81 MG tablet Take 81 mg by mouth daily. Swallow whole.   atorvastatin (LIPITOR) 80 MG tablet Take 80 mg by mouth daily.   clopidogrel (PLAVIX) 75 MG tablet Take 1 tablet (75 mg total) by mouth daily.   Cyanocobalamin (VITAMIN B-12 IJ) Inject 1,000 mg as directed every 30 (thirty) days.   dicyclomine (BENTYL) 10 MG capsule Take 10 mg by mouth daily.   EPINEPHrine 0.3 mg/0.3 mL IJ SOAJ injection Inject 0.3 mg into the muscle as needed for anaphylaxis.   ergocalciferol (VITAMIN D2) 1.25 MG (50000 UT) capsule Take 1 capsule by mouth once a week.   fluticasone (FLONASE) 50 MCG/ACT nasal spray Place 1 spray into both nostrils daily.   furosemide (LASIX) 20 MG tablet Take 20 mg by  mouth daily.   gabapentin (NEURONTIN) 300 MG capsule Take 600 mg by mouth 3 (three) times daily.    GOODSENSE PAIN RELIEF 650 MG CR tablet Take 650 mg by mouth every 8 (eight) hours as needed for pain.   ipratropium (ATROVENT) 0.06 % nasal spray Can use 2 sprays in each nostril up to 3-4 times a day as needed for nasal congestion/drainage. (Patient taking differently: Place 2 sprays into both nostrils 4 (four) times daily. Can use 2 sprays in each nostril up to 3-4 times a day as needed for nasal congestion/drainage.)   ipratropium-albuterol (DUONEB) 0.5-2.5 (3) MG/3ML SOLN 3 mLs every 6 (six) hours as needed (Wheezing  and SOB).   lacosamide (VIMPAT) 200 MG TABS tablet Take 200 mg by mouth 2 (two) times daily.   lamoTRIgine (LAMICTAL) 25 MG tablet Take 2 tablets by mouth 2 (two) times daily.   levofloxacin (LEVAQUIN) 500 MG tablet Take 500 mg by mouth daily.  lidocaine (XYLOCAINE) 5 % ointment Apply 1 application topically as needed for pain.   LINZESS 145 MCG CAPS capsule Take 145 mcg by mouth daily.   loratadine (CLARITIN) 10 MG tablet Take 10 mg by mouth daily.   montelukast (SINGULAIR) 10 MG tablet TAKE 1 TABLET BY MOUTH ONCE DAILY (Patient taking differently: Take 10 mg by mouth at bedtime. TAKE 1 TABLET BY MOUTH ONCE DAILY)   nitroGLYCERIN (NITROSTAT) 0.4 MG SL tablet TAKE 1 TABLET UNDER THE TONGUE AS NEEDEDFOR CHEST PAIN ( MAY REPEAT EVERY 5 MINUTES X 3) (Patient taking differently: Place 0.4 mg under the tongue every 5 (five) minutes as needed for chest pain.)   Olopatadine HCl 0.2 % SOLN Can use one drop in each eye once daily if needed for itchy, watery, red eyes. (Patient taking differently: Place 1 drop into both eyes as needed (itching). Can use one drop in each eye once daily if needed for itchy, watery, red eyes.)   ondansetron (ZOFRAN) 8 MG tablet Take 8 mg by mouth every 8 (eight) hours as needed for nausea or vomiting.   oxyCODONE-acetaminophen (PERCOCET) 10-325 MG tablet Take 1  tablet by mouth every 4 (four) hours as needed for pain. One tablet every 4 hours as needed for pain.   pantoprazole (PROTONIX) 40 MG tablet Take 40 mg by mouth daily.   ranolazine (RANEXA) 500 MG 12 hr tablet TAKE 1 TABLET BY MOUTH TWICE(2) DAILY   rOPINIRole (REQUIP) 4 MG tablet Take 4 mg by mouth at bedtime.   rosuvastatin (CRESTOR) 5 MG tablet TAKE 1 TABLET BY MOUTH ONCE DAILY.   tiZANidine (ZANAFLEX) 4 MG tablet Take 4 mg by mouth 3 (three) times daily.   TRELEGY ELLIPTA 100-62.5-25 MCG/INH AEPB Inhale 1 puff into the lungs daily.   Vitamin D, Cholecalciferol, 1000 units TABS Take 3 tablets by mouth once a week.     Allergies:   Carimune [immune globulin], Adhesive [tape], Amitriptyline, Imitrex [sumatriptan], Nsaids, Tramadol, Prednisone, Buprenorphine hcl, Morphine, and Morphine and related   Social History   Socioeconomic History   Marital status: Widowed    Spouse name: Not on file   Number of children: 0   Years of education: HS   Highest education level: Not on file  Occupational History   Occupation: Disabled  Tobacco Use   Smoking status: Every Day    Packs/day: 0.50    Years: 36.00    Pack years: 18.00    Types: Cigarettes   Smokeless tobacco: Never  Vaping Use   Vaping Use: Never used  Substance and Sexual Activity   Alcohol use: No   Drug use: No   Sexual activity: Not on file  Other Topics Concern   Not on file  Social History Narrative   Not on file   Social Determinants of Health   Financial Resource Strain: Not on file  Food Insecurity: Not on file  Transportation Needs: Not on file  Physical Activity: Not on file  Stress: Not on file  Social Connections: Not on file     Family History: The patient's family history includes Allergic rhinitis in her brother; Asthma in her mother; COPD in her brother; Heart attack in her father; High blood pressure in her mother; Lung cancer in her mother; Seizures in her mother; Stroke in her mother. ROS:    Please see the history of present illness.    All 14 point review of systems negative except as described per history of present illness  EKGs/Labs/Other Studies  Reviewed:      Recent Labs: No results found for requested labs within last 8760 hours.  Recent Lipid Panel No results found for: CHOL, TRIG, HDL, CHOLHDL, VLDL, LDLCALC, LDLDIRECT  Physical Exam:    VS:  BP 108/66 (BP Location: Right Arm)    Pulse 60    Ht 5\' 5"  (1.651 m)    Wt 173 lb (78.5 kg)    SpO2 98%    BMI 28.79 kg/m     Wt Readings from Last 3 Encounters:  10/22/21 173 lb (78.5 kg)  11/27/20 163 lb (73.9 kg)  10/21/20 162 lb (73.5 kg)     GEN:  Well nourished, well developed in no acute distress HEENT: Normal NECK: No JVD; No carotid bruits LYMPHATICS: No lymphadenopathy CARDIAC: RRR, no murmurs, no rubs, no gallops RESPIRATORY:  Clear to auscultation without rales, wheezing or rhonchi  ABDOMEN: Soft, non-tender, non-distended MUSCULOSKELETAL:  No edema; No deformity  SKIN: Warm and dry LOWER EXTREMITIES: no swelling NEUROLOGIC:  Alert and oriented x 3 PSYCHIATRIC:  Normal affect   ASSESSMENT:    1. Coronary artery disease of native artery of native heart with stable angina pectoris ([place])   2. Essential hypertension   3. Transient ischemic attack (TIA)   4. COPD with asthma ([place])   5. Tobacco abuse   6. Chest discomfort    PLAN:    In order of problems listed above:  Coronary disease she still having some symptoms just and potentially reactivation of the problem.  I will schedule her to have a Lexiscan in the meantime we will continue antiplatelet therapy.  I will start her back on very small dose of beta-blocker it will be only 12.5 of Toprol twice daily hoping to accomplish antianginal effect as well as prevent her from spitting up too much. COPD stable she is trying to work on quitting smoking History of CVA/TIA.  As she is on antiplatelet therapy she is also on statin which we will  continue. Chest discomfort: Stress test will be scheduled   Medication Adjustments/Labs and Tests Ordered: Current medicines are reviewed at length with the patient today.  Concerns regarding medicines are outlined above.  No orders of the defined types were placed in this encounter.  Medication changes: No orders of the defined types were placed in this encounter.   Signed, Park Liter, MD, [facility] 10/22/2021 1:30 PM    [place]

## 2021-10-22 NOTE — Patient Instructions (Addendum)
Medication Instructions: Your physician has recommended you make the following change in your medication:   STOP: Crestor STOP: Lasix START: Metoprolol tartrate 12.5 mg twice daily  *If you need a refill on your cardiac medications before your next appointment, please call your pharmacy*   Lab Work: None  If you have labs (blood work) drawn today and your tests are completely normal, you will receive your results only by: [place] (if you have MyChart) OR A paper copy in the mail If you have any lab test that is abnormal or we need to change your treatment, we will call you to review the results.   Testing/Procedures:   The test will take approximately 3 to 4 hours to complete; you may bring reading material.  If someone comes with you to your appointment, they will need to remain in the main lobby due to limited space in the testing area. **If you are pregnant or breastfeeding, please notify the nuclear lab prior to your appointment**  How to prepare for your Myocardial Perfusion Test: Do not eat or drink 3 hours prior to your test, except you may have water. Do not consume products containing caffeine (regular or decaffeinated) 12 hours prior to your test. (ex: coffee, chocolate, sodas, tea). Do bring a list of your current medications with you.  If not listed below, you may take your medications as normal. Do wear comfortable clothes (no dresses or overalls) and walking shoes, tennis shoes preferred (No heels or open toe shoes are allowed). Do NOT wear cologne, perfume, aftershave, or lotions (deodorant is allowed). If these instructions are not followed, your test will have to be rescheduled.   Follow-Up: At [facility], you and your health needs are our priority.  As part of our continuing mission to provide you with exceptional heart care, we have created designated Provider Care Teams.  These Care Teams include your primary Cardiologist (physician) and Advanced  Practice Providers (APPs -  Physician Assistants and Nurse Practitioners) who all work together to provide you with the care you need, when you need it.  We recommend signing up for the patient portal called "MyChart".  Sign up information is provided on this After Visit Summary.  MyChart is used to connect with patients for Virtual Visits (Telemedicine).  Patients are able to view lab/test results, encounter notes, upcoming appointments, etc.  Non-urgent messages can be sent to your provider as well.   To learn more about what you can do with MyChart, go to [email or web address].    Your next appointment:   6 week(s)  The format for your next appointment:   In Person  Provider:   Jenne Campus, MD    Other Instructions

## 2021-10-23 MED ORDER — NITROGLYCERIN 0.4 MG SL SUBL: 0.4000 mg | SUBLINGUAL_TABLET | SUBLINGUAL | 11 refills | Status: DC | PRN

## 2021-10-23 NOTE — Telephone Encounter (Signed)
Refill of Nitroglycerin 0.4 mg sent to [facility].

## 2021-10-27 ENCOUNTER — Other Ambulatory Visit: Payer: Self-pay

## 2021-10-27 ENCOUNTER — Encounter: Payer: Self-pay | Admitting: *Deleted

## 2021-10-27 ENCOUNTER — Telehealth: Payer: Self-pay | Admitting: *Deleted

## 2021-10-27 DIAGNOSIS — R079 Chest pain, unspecified: Secondary | ICD-10-CM

## 2021-10-27 NOTE — Telephone Encounter (Signed)
Left message on voicemail per DPR in reference to upcoming appointment scheduled on 11/03/21 at 0745 with detailed instructions given per Myocardial Perfusion Study Information Sheet for the test. LM to arrive 15 minutes early, and that it is imperative to arrive on time for appointment to keep from having the test rescheduled. If you need to cancel or reschedule your appointment, please call the office within 24 hours of your appointment. Failure to do so may result in a cancellation of your appointment, and a $50 no show fee. Phone number given for call back for any questions.  Mychart letter sent. Jolisa Intriago, [place]

## 2021-10-28 NOTE — Progress Notes (Signed)
c 

## 2021-11-10 ENCOUNTER — Telehealth: Payer: Self-pay | Admitting: *Deleted

## 2021-11-10 NOTE — Telephone Encounter (Signed)
Patient sister advised pharmacy that they were advised to hold immunoglobulin therapy until return to see cardiologist. I look at the notes and was unable to find anything to hold therapy but to hold some other medications ?

## 2021-11-11 ENCOUNTER — Telehealth (HOSPITAL_COMMUNITY): Payer: Self-pay | Admitting: *Deleted

## 2021-11-11 NOTE — Telephone Encounter (Signed)
Called [facility] and advised to check back with patient in couple weeks she does have cardio appt tomorrow so maybe they will get answers to restart Hizentra. I have spoken to patient sister Kathy Moss and advised her numerous times risk of her being off her Hizentra ?

## 2021-11-11 NOTE — Telephone Encounter (Signed)
Left message on voicemail per DPR in reference to upcoming appointment scheduled on  11/12/21 with detailed instructions given per Myocardial Perfusion Study Information Sheet for the test. LM to arrive 15 minutes early, and that it is imperative to arrive on time for appointment to keep from having the test rescheduled. If you need to cancel or reschedule your appointment, please call the office within 24 hours of your appointment. Failure to do so may result in a cancellation of your appointment, and a $50 no show fee. Phone number given for call back for any questions. Kirstie Peri ? ? ?

## 2021-11-20 ENCOUNTER — Other Ambulatory Visit: Payer: Self-pay | Admitting: Cardiology

## 2021-12-01 ENCOUNTER — Encounter: Payer: Self-pay | Admitting: *Deleted

## 2021-12-02 ENCOUNTER — Ambulatory Visit (INDEPENDENT_AMBULATORY_CARE_PROVIDER_SITE_OTHER): Payer: Medicare Other

## 2021-12-02 DIAGNOSIS — R079 Chest pain, unspecified: Secondary | ICD-10-CM | POA: Diagnosis not present

## 2021-12-02 MED ORDER — TECHNETIUM TC 99M TETROFOSMIN IV KIT
31.2000 | PACK | Freq: Once | INTRAVENOUS | Status: AC | PRN
  Administered 2021-12-02: 31.2 via INTRAVENOUS

## 2021-12-02 MED ORDER — TECHNETIUM TC 99M TETROFOSMIN IV KIT
10.7000 | PACK | Freq: Once | INTRAVENOUS | Status: AC | PRN
  Administered 2021-12-02: 10.7 via INTRAVENOUS

## 2021-12-02 MED ORDER — ADENOSINE (DIAGNOSTIC) 3 MG/ML IV SOLN: 0.5600 mg/kg | Freq: Once | INTRAVENOUS | Status: AC

## 2021-12-03 LAB — MYOCARDIAL PERFUSION IMAGING
LV dias vol: 90 mL (ref 46–106)
LV sys vol: 28 mL
Nuc Stress EF: 68 %
Peak HR: 76 {beats}/min
Rest HR: 54 {beats}/min
Rest Nuclear Isotope Dose: 10.7 mCi
SDS: 1
SRS: 2
SSS: 3
ST Depression (mm): 0 mm
Stress Nuclear Isotope Dose: 31.2 mCi
TID: 1

## 2021-12-04 ENCOUNTER — Ambulatory Visit: Payer: Medicare Other | Admitting: Cardiology

## 2022-02-12 ENCOUNTER — Telehealth: Payer: Self-pay

## 2022-02-12 NOTE — Telephone Encounter (Signed)
   [facility] Pre-operative Risk Assessment    Request for surgical clearance:  What type of surgery is being performed?  Revision T10-L5 fusion   When is this surgery scheduled? TBD   What type of clearance is required (medical clearance vs. Pharmacy clearance to hold med vs. Both)? Both  Are there any medications that need to be held prior to surgery and how long? Not specified    Practice name and name of physician performing surgery? Dr. Rennis Harding at [facility]   What is your office phone number: [phone]    7.   What is your office fax number: [phone]  8.   Anesthesia type (None, local, MAC, general) ? Not specified    Basil Dess Happy Begeman 02/12/2022, 3:59 PM  _________________________________________________________________   (provider comments below)

## 2022-02-12 NOTE — Telephone Encounter (Signed)
Kathy Moss 61 year old female is requesting preoperative cardiac evaluation for revision T10-L5 fusion.  She was last seen in the clinic on 10/22/2021.  During that time she noted episodes of increased heart rate and chest tightness.  She underwent stress testing which showed low risk and no ischemia.  Her past medical history includes coronary artery disease, HTN, COPD, transient ischemic attack, tobacco abuse, and type 2 diabetes.  She underwent LHC 03/16/2017.  Her catheterization showed 40% mid RCA stenosis.    May her aspirin and Plavix be held prior to her procedure?   Thank you for your help.  Please direct your response to CV DIV preop pool.  Jossie Ng. Lorre Opdahl NP-C    02/12/2022, [facility] [place] [address] Office [phone] Fax [phone]

## 2022-02-17 ENCOUNTER — Telehealth: Payer: Self-pay | Admitting: *Deleted

## 2022-02-17 ENCOUNTER — Other Ambulatory Visit: Payer: Self-pay | Admitting: Cardiology

## 2022-02-17 MED ORDER — RANOLAZINE ER 500 MG PO TB12: 500.0000 mg | ORAL_TABLET | Freq: Two times a day (BID) | ORAL | 0 refills | Status: DC

## 2022-02-17 NOTE — Telephone Encounter (Signed)
Pt has been scheduled for a televisit, 02/18/22, 1:00, clearance will be addressed at that time.

## 2022-02-17 NOTE — Telephone Encounter (Signed)
1st attempt to reach pt regarding surgical clearance and the need for a televisit, left a message for pt to call back.

## 2022-02-17 NOTE — Telephone Encounter (Signed)
Pt has been scheduled for a televisit, 02/18/22, 1:00, medications reconciled / consent on file.    Patient Consent for Virtual Visit        Kathy Moss has provided verbal consent on 02/17/2022 for a virtual visit (video or telephone).   CONSENT FOR VIRTUAL VISIT FOR:  [place]  By participating in this virtual visit I agree to the following:  I hereby voluntarily request, consent and authorize [facility] and its employed or contracted physicians, physician assistants, nurse practitioners or other licensed health care professionals (the Practitioner), to provide me with telemedicine health care services (the "Services") as deemed necessary by the treating Practitioner. I acknowledge and consent to receive the Services by the Practitioner via telemedicine. I understand that the telemedicine visit will involve communicating with the Practitioner through live audiovisual communication technology and the disclosure of certain medical information by electronic transmission. I acknowledge that I have been given the opportunity to request an in-person assessment or other available alternative prior to the telemedicine visit and am voluntarily participating in the telemedicine visit.  I understand that I have the right to withhold or withdraw my consent to the use of telemedicine in the course of my care at any time, without affecting my right to future care or treatment, and that the Practitioner or I may terminate the telemedicine visit at any time. I understand that I have the right to inspect all information obtained and/or recorded in the course of the telemedicine visit and may receive copies of available information for a reasonable fee.  I understand that some of the potential risks of receiving the Services via telemedicine include:  Delay or interruption in medical evaluation due to technological equipment failure or disruption; Information transmitted may not be sufficient (e.g. poor  resolution of images) to allow for appropriate medical decision making by the Practitioner; and/or  In rare instances, security protocols could fail, causing a breach of personal health information.  Furthermore, I acknowledge that it is my responsibility to provide information about my medical history, conditions and care that is complete and accurate to the best of my ability. I acknowledge that Practitioner's advice, recommendations, and/or decision may be based on factors not within their control, such as incomplete or inaccurate data provided by me or distortions of diagnostic images or specimens that may result from electronic transmissions. I understand that the practice of medicine is not an exact science and that Practitioner makes no warranties or guarantees regarding treatment outcomes. I acknowledge that a copy of this consent can be made available to me via my patient portal ([place]), or I can request a printed copy by calling the office of [place].    I understand that my insurance will be billed for this visit.   I have read or had this consent read to me. I understand the contents of this consent, which adequately explains the benefits and risks of the Services being provided via telemedicine.  I have been provided ample opportunity to ask questions regarding this consent and the Services and have had my questions answered to my satisfaction. I give my informed consent for the services to be provided through the use of telemedicine in my medical care

## 2022-02-17 NOTE — Telephone Encounter (Signed)
    Name: Kathy Moss  DOB: [birth year]  MRN: [id number]  Primary Cardiologist: Jenne Campus, MD   Preoperative team, please contact this patient and set up a phone call appointment for further preoperative risk assessment. Please obtain consent and complete medication review. Thank you for your help.  I confirm that guidance regarding antiplatelet and oral anticoagulation therapy has been completed and, if necessary, noted below.  She is on ASA and plavix for recent stroke. Previously on plavix monotherapy for nonobstructive CAD.   Ledora Bottcher, [place] 02/17/2022, 10:29 AM [place] [address] [place],  [address]

## 2022-02-18 ENCOUNTER — Ambulatory Visit (INDEPENDENT_AMBULATORY_CARE_PROVIDER_SITE_OTHER): Payer: Medicare Other | Admitting: Physician Assistant

## 2022-02-18 DIAGNOSIS — Z01810 Encounter for preprocedural cardiovascular examination: Secondary | ICD-10-CM | POA: Diagnosis not present

## 2022-06-03 ENCOUNTER — Other Ambulatory Visit: Payer: Self-pay | Admitting: Cardiology

## 2022-06-03 NOTE — Telephone Encounter (Signed)
Refill for Rosuvastatin denied, not on patients profile

## 2022-08-30 DIAGNOSIS — Q2112 Patent foramen ovale: Secondary | ICD-10-CM | POA: Insufficient documentation

## 2022-08-30 HISTORY — DX: Patent foramen ovale: Q21.12

## 2022-10-27 ENCOUNTER — Other Ambulatory Visit: Payer: Self-pay | Admitting: Cardiology

## 2022-11-02 ENCOUNTER — Other Ambulatory Visit: Payer: Self-pay

## 2022-11-02 MED ORDER — RANOLAZINE ER 500 MG PO TB12: 500.0000 mg | ORAL_TABLET | Freq: Two times a day (BID) | ORAL | 0 refills | Status: DC

## 2022-11-23 ENCOUNTER — Other Ambulatory Visit: Payer: Self-pay | Admitting: Cardiology

## 2022-11-29 ENCOUNTER — Other Ambulatory Visit: Payer: Self-pay | Admitting: Cardiology

## 2022-11-29 NOTE — Telephone Encounter (Signed)
Rx refill sent to pharmacy. 

## 2022-12-14 ENCOUNTER — Telehealth: Payer: Self-pay | Admitting: Cardiology

## 2022-12-14 ENCOUNTER — Other Ambulatory Visit: Payer: Self-pay

## 2022-12-14 MED ORDER — CLOPIDOGREL BISULFATE 75 MG PO TABS: 75.0000 mg | ORAL_TABLET | Freq: Every day | ORAL | 0 refills | Status: DC

## 2022-12-14 NOTE — Telephone Encounter (Signed)
*  STAT* If patient is at the pharmacy, call can be transferred to refill team.   1. Which medications need to be refilled? (please list name of each medication and dose if known) clopidogrel (PLAVIX) 75 MG tablet   2. Which pharmacy/location (including street and city if local pharmacy) is medication to be sent to?  [facility] - [place], [place] - [address]    3. Do they need a 30 day or 90 day supply? 90   Out of medication and has appt on 5/16

## 2022-12-27 ENCOUNTER — Other Ambulatory Visit: Payer: Self-pay | Admitting: Cardiology

## 2023-01-04 ENCOUNTER — Other Ambulatory Visit: Payer: Self-pay | Admitting: Cardiology

## 2023-01-13 ENCOUNTER — Ambulatory Visit: Payer: 59 | Attending: Cardiology | Admitting: Cardiology

## 2023-01-21 ENCOUNTER — Other Ambulatory Visit: Payer: Self-pay | Admitting: Cardiology

## 2023-02-01 ENCOUNTER — Other Ambulatory Visit: Payer: Self-pay | Admitting: Cardiology

## 2023-02-01 NOTE — Telephone Encounter (Signed)
Rx refill sent to pharmacy. 

## 2023-02-15 ENCOUNTER — Other Ambulatory Visit: Payer: Self-pay | Admitting: Cardiology

## 2023-03-22 ENCOUNTER — Other Ambulatory Visit: Payer: Self-pay | Admitting: Cardiology

## 2023-03-24 ENCOUNTER — Other Ambulatory Visit: Payer: Self-pay | Admitting: Cardiology

## 2023-03-24 ENCOUNTER — Telehealth: Payer: Self-pay | Admitting: Cardiology

## 2023-03-24 NOTE — Telephone Encounter (Signed)
error 

## 2023-04-11 ENCOUNTER — Other Ambulatory Visit: Payer: Self-pay | Admitting: Cardiology

## 2023-04-12 ENCOUNTER — Other Ambulatory Visit: Payer: Self-pay | Admitting: Cardiology

## 2023-04-18 ENCOUNTER — Encounter: Payer: Self-pay | Admitting: Cardiology

## 2023-04-18 ENCOUNTER — Ambulatory Visit (INDEPENDENT_AMBULATORY_CARE_PROVIDER_SITE_OTHER): Payer: 59

## 2023-04-18 ENCOUNTER — Ambulatory Visit: Payer: 59 | Attending: Cardiology | Admitting: Cardiology

## 2023-04-18 VITALS — BP 180/100 | HR 60 | Ht 65.0 in | Wt 170.0 lb

## 2023-04-18 DIAGNOSIS — I25118 Atherosclerotic heart disease of native coronary artery with other forms of angina pectoris: Secondary | ICD-10-CM

## 2023-04-18 DIAGNOSIS — R002 Palpitations: Secondary | ICD-10-CM

## 2023-04-18 DIAGNOSIS — R072 Precordial pain: Secondary | ICD-10-CM | POA: Diagnosis not present

## 2023-04-18 DIAGNOSIS — I10 Essential (primary) hypertension: Secondary | ICD-10-CM | POA: Diagnosis not present

## 2023-04-18 MED ORDER — VARENICLINE TARTRATE (STARTER) 0.5 MG X 11 & 1 MG X 42 PO TBPK: 1.0000 | ORAL_TABLET | ORAL | 0 refills | Status: DC

## 2023-04-18 MED ORDER — AMLODIPINE BESYLATE 10 MG PO TABS: 10.0000 mg | ORAL_TABLET | Freq: Every day | ORAL | 3 refills | Status: DC

## 2023-04-18 NOTE — Patient Instructions (Addendum)
Medication Instructions:  Your physician has recommended you make the following change in your medication:  Start Norvasc 10 mg once daily for BP Start Chantix as directed for smoking cessation   *If you need a refill on your cardiac medications before your next appointment, please call your pharmacy*   Lab Work: Your physician recommends that you return for lab work in: Today for CMP, CBC and Direct LDL  If you have labs (blood work) drawn today and your tests are completely normal, you will receive your results only by: MyChart Message (if you have MyChart) OR A paper copy in the mail If you have any lab test that is abnormal or we need to change your treatment, we will call you to review the results.   Testing/Procedures: You have been asked to wear a Zio Heart Monitor today. It is to be worn for 14 days. Please remove the monitor on Sept 2nd and mail back in the box provided.  If you have any questions about the monitor please call the company at [phone]    Your cardiac CT will be scheduled at one of the below locations:   [facility] [address] [place], [place] [address] [phone]   If scheduled at [facility], please arrive at the [facility] and Children's Entrance (Entrance C2) of [facility] 30 minutes prior to test start time. You can use the FREE valet parking offered at entrance C (encouraged to control the heart rate for the test)  Proceed to the [facility] Radiology Department (first floor) to check-in and test prep.  All radiology patients and guests should use entrance C2 at [facility], accessed from [facility], even though the hospital's physical address listed is [address]     Please follow these instructions carefully (unless otherwise directed):   On the Night Before the Test: Be sure to Drink plenty of water. Do not consume any caffeinated/decaffeinated beverages or chocolate 12  hours prior to your test. Do not take any antihistamines 12 hours prior to your test. On the Day of the Test: Drink plenty of water until 1 hour prior to the test. Do not eat any food 4 hours prior to the test. You may take your regular medications prior to the test.  Take metoprolol (Lopressor) two hours prior to test. Take your Metoprolol 25 mg once that day 2 hours prior to CT HOLD Furosemide/Hydrochlorothiazide morning of the test. FEMALES- please wear underwire-free bra if available, avoid dresses & tight clothing   *For Clinical Staff only. Please instruct patient the following:* Heart Rate Medication Recommendations for Cardiac CT  Resting HR < 50 bpm  No medication  Resting HR 50-60 bpm and BP >110/50 mmHG   Consider Metoprolol tartrate 25 mg PO 90-120 min prior to scan  Resting HR 60-65 bpm and BP >110/50 mmHG  Metoprolol tartrate 50 mg PO 90-120 minutes prior to scan   Resting HR > 65 bpm and BP >110/50 mmHG  Metoprolol tartrate 100 mg PO 90-120 minutes prior to scan  Consider Ivabradine 10-15 mg PO or a calcium channel blocker for resting HR >60 bpm and contraindication to metoprolol tartrate  Consider Ivabradine 10-15 mg PO in combination with metoprolol tartrate for HR >80 bpm         After the Test: Drink plenty of water. After receiving IV contrast, you may experience a mild flushed feeling. This is normal. On occasion, you may experience a mild rash  up to 24 hours after the test. This is not dangerous. If this occurs, you can take Benadryl 25 mg and increase your fluid intake. If you experience trouble breathing, this can be serious. If it is severe call 911 IMMEDIATELY. If it is mild, please call our office. If you take any of these medications: Glipizide/Metformin, Avandament, Glucavance, please do not take 48 hours after completing test unless otherwise instructed.  We will call to schedule your test 2-4 weeks out understanding that some insurance companies will  need an authorization prior to the service being performed.   For non-scheduling related questions, please contact the cardiac imaging nurse navigator should you have any questions/concerns: Rockwell Alexandria, Cardiac Imaging Nurse Navigator Larey Brick, Cardiac Imaging Nurse Navigator [facility] Direct Office Dial: [phone]   For scheduling needs, including cancellations and rescheduling, please call [place], [phone].     Follow-Up: At [facility], you and your health needs are our priority.  As part of our continuing mission to provide you with exceptional heart care, we have created designated Provider Care Teams.  These Care Teams include your primary Cardiologist (physician) and Advanced Practice Providers (APPs -  Physician Assistants and Nurse Practitioners) who all work together to provide you with the care you need, when you need it.  We recommend signing up for the patient portal called "MyChart".  Sign up information is provided on this After Visit Summary.  MyChart is used to connect with patients for Virtual Visits (Telemedicine).  Patients are able to view lab/test results, encounter notes, upcoming appointments, etc.  Non-urgent messages can be sent to your provider as well.   To learn more about what you can do with MyChart, go to [email or web address].    Your next appointment:   4 week(s)  Provider:   Wallis Bamberg, NP Rosalita Levan)    Other Instructions Check and record BP twice daily for 2 weeks and send to [facility] using MyChart

## 2023-04-18 NOTE — Progress Notes (Signed)
Cardiology Office Note:  .   Date:  04/18/2023  ID:  Kathy Moss, DOB [birth year], MRN [id number] PCP: Audie Pinto, FNP  [facility] Providers Cardiologist:  Gypsy Balsam, MD    History of Present Illness: .    Kathy Moss is a 62 y.o. female with a past medical history of CAD nonobstructive per left heart cath in 2018, hypertension, hypotension, migraine, TIA, COPD 2 lpm , DM 2, CKD stage IIIa, seizure disorder, dyslipidemia, tobacco abuse.  12/03/2021 Lexiscan low risk study 03/16/2017 left heart cath nonobstructive CAD  Evaluated by Dr. Bing Matter on 10/22/2021, she was stable from a cardiac perspective, trying to stop smoking.  Lexiscan was arranged which revealed low risk study.   She presents today for follow-up of her CAD.  She has several complaints, she is accompanied by her caregiver [name].  She has noticed that she has episodes where her heart " gets out of whack"--can start racing or feel like it slows down, this is accompanied by a flushing sensation, and anxiety.  She also has some DOE, she says it feels more significant than her COPD.  Her blood pressure is very elevated in the office today, she is not currently on any antihypertensive agents.  She has some anxiety surrounding doctors, and has tried to avoid them over the last few years.  She continues to smoke however would like to quit.  She does have some memory issues, relies on her caregiver [name] to help answer questions. She denies chest pain,pnd, orthopnea, n, v, dizziness, syncope, edema, weight gain, or early satiety.    ROS: Review of Systems  Constitutional: Negative.   HENT: Negative.    Eyes: Negative.   Respiratory:  Positive for shortness of breath.   Cardiovascular:  Positive for palpitations.  Gastrointestinal: Negative.   Genitourinary: Negative.   Musculoskeletal: Negative.   Skin: Negative.   Neurological:  Positive for headaches.  Endo/Heme/Allergies: Negative.    Psychiatric/Behavioral:  Positive for memory loss. The patient is nervous/anxious.      Studies Reviewed: .        Cardiac Studies & Procedures   CARDIAC CATHETERIZATION  CARDIAC CATHETERIZATION 03/16/2017  Narrative  Mid RCA lesion, 40 %stenosed.  1st Mrg lesion, 10 %stenosed.  The left ventricular systolic function is normal.  LV end diastolic pressure is normal.  The left ventricular ejection fraction is 55-65% by visual estimate.  There is no aortic valve stenosis.  Nonobstructive CAD.  Continue aggressive preventive therapy including smoking cessation.  Findings Coronary Findings Diagnostic  Dominance: Right  Ramus Intermedius Vessel is small.  Left Circumflex  First Obtuse Marginal Branch  Right Coronary Artery  Intervention  No interventions have been documented.   STRESS TESTS  MYOCARDIAL PERFUSION IMAGING 12/02/2021  Narrative   Findings are consistent with no ischemia and no prior myocardial infarction. The study is low risk.   No ST deviation was noted.   Left ventricular function is normal. Nuclear stress EF: 68 %. The left ventricular ejection fraction is hyperdynamic (>65%). End diastolic cavity size is normal.   Prior study not available for comparison.              Risk Assessment/Calculations:     HYPERTENSION CONTROL Vitals:   04/18/23 1532 04/18/23 1648  BP: (!) 160/100 (!) 180/100    The patient's blood pressure is elevated above target today.  In order to address the patient's elevated BP: Blood pressure will be monitored at home to determine if  medication changes need to be made.; A new medication was prescribed today.          Physical Exam:   VS:  BP (!) 180/100   Pulse 60   Ht 5\' 5"  (1.651 m)   Wt 170 lb (77.1 kg)   SpO2 94%   BMI 28.29 kg/m    Wt Readings from Last 3 Encounters:  04/18/23 170 lb (77.1 kg)  12/02/21 173 lb (78.5 kg)  10/22/21 173 lb (78.5 kg)    GEN: Well nourished, well developed in no acute  distress NECK: No JVD; No carotid bruits CARDIAC: RRR, no murmurs, rubs, gallops RESPIRATORY:  Clear to auscultation without rales, wheezing or rhonchi  ABDOMEN: Soft, non-tender, non-distended EXTREMITIES:  No edema; No deformity   ASSESSMENT AND PLAN: .   CAD -nonobstructive per left heart cath in 2018, continue aspirin 81 mg daily, continue Lipitor 80 mg daily, continue metoprolol 12.5 mg twice daily, continue nitroglycerin as needed, continue Ranexa 500 mg twice daily.  She has some DOE, which could be an anginal equivalent, will arrange for coronary CTA.  Hypertension-her blood pressure is fairly elevated in the office today, she is not currently on any antihypertensive agents.  Will start her on amlodipine 10 mg daily.  Her caregiver will keep a blood pressure log x 2 weeks and send these to me via MyChart.  Palpitations-will arrange for a 2-week monitor, check CBC, CMET  Tobacco abuse-continues to smoke however is trying to cut down.  She request help, will start her on Chantix.  Hyperlipidemia - on a statin, but no recent lab work. Will check LFTs and direct LDL.        Dispo: cCTA, 2 week monitor, labs, Chantix, return in 4 weeks.  Signed, Flossie Dibble, NP

## 2023-04-19 LAB — COMPREHENSIVE METABOLIC PANEL WITH GFR
ALT: 19 IU/L (ref 0–32)
AST: 19 IU/L (ref 0–40)
Albumin: 4.5 g/dL (ref 3.8–4.9)
Alkaline Phosphatase: 108 IU/L (ref 44–121)
BUN/Creatinine Ratio: 15 (ref 12–28)
BUN: 18 mg/dL (ref 8–27)
Bilirubin Total: <0.2 mg/dL (ref 0.0–1.2)
CO2: 20 mmol/L (ref 20–29)
Calcium: 9.6 mg/dL (ref 8.7–10.3)
Chloride: 106 mmol/L (ref 96–106)
Creatinine, Ser: 1.2 mg/dL — ABNORMAL HIGH (ref 0.57–1.00)
Globulin, Total: 2.1 g/dL (ref 1.5–4.5)
Glucose: 84 mg/dL (ref 70–99)
Potassium: 4.7 mmol/L (ref 3.5–5.2)
Sodium: 142 mmol/L (ref 134–144)
Total Protein: 6.6 g/dL (ref 6.0–8.5)
eGFR: 52 mL/min/1.73 — ABNORMAL LOW

## 2023-04-19 LAB — CBC WITH DIFFERENTIAL/PLATELET
Basophils Absolute: 0.1 x10E3/uL (ref 0.0–0.2)
Basos: 1 %
EOS (ABSOLUTE): 0.2 x10E3/uL (ref 0.0–0.4)
Eos: 3 %
Hematocrit: 45.3 % (ref 34.0–46.6)
Hemoglobin: 15.6 g/dL (ref 11.1–15.9)
Immature Grans (Abs): 0 x10E3/uL (ref 0.0–0.1)
Immature Granulocytes: 0 %
Lymphocytes Absolute: 2 x10E3/uL (ref 0.7–3.1)
Lymphs: 26 %
MCH: 32.6 pg (ref 26.6–33.0)
MCHC: 34.4 g/dL (ref 31.5–35.7)
MCV: 95 fL (ref 79–97)
Monocytes Absolute: 0.6 x10E3/uL (ref 0.1–0.9)
Monocytes: 8 %
Neutrophils Absolute: 4.8 x10E3/uL (ref 1.4–7.0)
Neutrophils: 62 %
Platelets: 231 x10E3/uL (ref 150–450)
RBC: 4.78 x10E6/uL (ref 3.77–5.28)
RDW: 13.3 % (ref 11.7–15.4)
WBC: 7.8 x10E3/uL (ref 3.4–10.8)

## 2023-04-19 LAB — LDL CHOLESTEROL, DIRECT: LDL Direct: 207 mg/dL — ABNORMAL HIGH (ref 0–99)

## 2023-04-20 ENCOUNTER — Other Ambulatory Visit (HOSPITAL_COMMUNITY): Payer: Self-pay

## 2023-04-20 ENCOUNTER — Telehealth: Payer: Self-pay

## 2023-04-20 MED ORDER — ATORVASTATIN CALCIUM 80 MG PO TABS
80.0000 mg | ORAL_TABLET | Freq: Every day | ORAL | 3 refills | Status: DC
  Filled 2023-04-20 (×2): qty 90, 90d supply, fill #0
  Filled 2023-07-15: qty 90, 90d supply, fill #1
  Filled 2023-10-04: qty 90, 90d supply, fill #2
  Filled 2023-12-13: qty 90, 90d supply, fill #3

## 2023-04-20 MED ORDER — VARENICLINE TARTRATE (STARTER) 0.5 MG X 11 & 1 MG X 42 PO TBPK
1.0000 | ORAL_TABLET | ORAL | 0 refills | Status: DC
  Filled 2023-04-20 (×2): qty 53, 30d supply, fill #0

## 2023-04-20 NOTE — Telephone Encounter (Signed)
-----   Message from Flossie Dibble sent at 04/19/2023  7:18 AM EDT ----- Stable kidney dysfunction. Normal electrolytes. Normal blood count. Cholesterol is over double what it should be. I want to confirm she is taking Lipitor 80 mg daily, her cholesterol suggests maybe she is not taking it.

## 2023-04-20 NOTE — Telephone Encounter (Signed)
Patient called.  Left message for patient to call back.

## 2023-04-21 ENCOUNTER — Encounter (HOSPITAL_COMMUNITY): Payer: Self-pay

## 2023-04-21 ENCOUNTER — Other Ambulatory Visit: Payer: Self-pay

## 2023-04-25 ENCOUNTER — Ambulatory Visit (HOSPITAL_COMMUNITY)
Admission: RE | Admit: 2023-04-25 | Discharge: 2023-04-25 | Disposition: A | Discharge status: Home / Self Care | Payer: 59 | Source: Ambulatory Visit | Attending: Cardiology | Admitting: Cardiology

## 2023-04-25 DIAGNOSIS — R072 Precordial pain: Secondary | ICD-10-CM | POA: Insufficient documentation

## 2023-04-25 DIAGNOSIS — I2510 Atherosclerotic heart disease of native coronary artery without angina pectoris: Secondary | ICD-10-CM | POA: Diagnosis not present

## 2023-04-25 MED ORDER — NITROGLYCERIN 0.4 MG SL SUBL
SUBLINGUAL_TABLET | SUBLINGUAL | Status: AC
  Filled 2023-04-25: qty 2

## 2023-04-25 MED ORDER — IOHEXOL 350 MG/ML SOLN
100.0000 mL | Freq: Once | INTRAVENOUS | Status: AC | PRN
  Administered 2023-04-25: 100 mL via INTRAVENOUS

## 2023-04-25 MED ORDER — NITROGLYCERIN 0.4 MG SL SUBL
0.8000 mg | SUBLINGUAL_TABLET | SUBLINGUAL | Status: DC | PRN
  Administered 2023-04-25: 0.8 mg via SUBLINGUAL

## 2023-04-26 ENCOUNTER — Telehealth: Payer: Self-pay | Admitting: Cardiology

## 2023-04-26 ENCOUNTER — Ambulatory Visit (HOSPITAL_COMMUNITY)
Admission: RE | Admit: 2023-04-26 | Discharge: 2023-04-26 | Disposition: A | Discharge status: Home / Self Care | Payer: 59 | Source: Ambulatory Visit | Attending: Cardiology | Admitting: Cardiology

## 2023-04-26 ENCOUNTER — Other Ambulatory Visit: Payer: Self-pay | Admitting: Cardiology

## 2023-04-26 DIAGNOSIS — R931 Abnormal findings on diagnostic imaging of heart and coronary circulation: Secondary | ICD-10-CM

## 2023-04-26 NOTE — Telephone Encounter (Signed)
[name] called in on behalf on patient regarding CT results. She is requesting a call back to discuss.

## 2023-04-27 NOTE — Telephone Encounter (Signed)
Left vm to call back

## 2023-04-28 NOTE — Telephone Encounter (Signed)
Left vm to call back

## 2023-04-29 NOTE — Telephone Encounter (Signed)
Left vm to call back

## 2023-05-03 NOTE — Telephone Encounter (Signed)
Results reviewed with [name] per DPR as per Wallis Bamberg PA's note. [name] verbalized understanding and had no additional questions. Routed to PCP.

## 2023-05-11 ENCOUNTER — Telehealth: Payer: Self-pay | Admitting: Cardiology

## 2023-05-11 NOTE — Telephone Encounter (Signed)
[facility] called with a report of Symptomatic Bradycardia 39 BPM lasting 30sec on Apr 29, 2023 Page 18, Strip 10. Report in is system.

## 2023-05-11 NOTE — Telephone Encounter (Signed)
Office calling to report abnormal results

## 2023-05-15 ENCOUNTER — Encounter: Payer: Self-pay | Admitting: Cardiology

## 2023-05-15 NOTE — Progress Notes (Deleted)
Cardiology Office Note:  .   Date:  05/15/2023  ID:  Kathy Moss, DOB 09/27/1960, MRN [id number] PCP: Audie Pinto, FNP  [facility] Providers Cardiologist:  Gypsy Balsam, MD    History of Present Illness: .    Kathy Moss is a 62 y.o. female with a past medical history of CAD nonobstructive per left heart cath in 2018, bradycardia, hypertension, hypotension, migraine, TIA, COPD 2 lpm , DM 2, CKD stage IIIa, seizure disorder, dyslipidemia, tobacco abuse, PFO.  04/18/2023 monitor average heart rate of 55 bpm, predominant rhythm is sinus, 8 episodes of SVT 04/26/2023 coronary CTA revealed a calcium score of 436, 90th percentile, moderate stenosis in the proximal RCA however FFR revealed likely no hemodynamic significance. 12/03/2021 Lexiscan low risk study 03/16/2017 left heart cath nonobstructive CAD  Evaluated by Dr. Bing Matter on 10/22/2021, she was stable from a cardiac perspective, trying to stop smoking.  Lexiscan was arranged which revealed low risk study.   She presents today for follow-up of her CAD.  She has several complaints, she is accompanied by her caregiver [name].  She has noticed that she has episodes where her heart " gets out of whack"--can start racing or feel like it slows down, this is accompanied by a flushing sensation, and anxiety.  She also has some DOE, she says it feels more significant than her COPD.  Her blood pressure is very elevated in the office today, she is not currently on any antihypertensive agents.  She has some anxiety surrounding doctors, and has tried to avoid them over the last few years.  She continues to smoke however would like to quit.  She does have some memory issues, relies on her caregiver [name] to help answer questions. She denies chest pain,pnd, orthopnea, n, v, dizziness, syncope, edema, weight gain, or early satiety.   Decrease metoprolol   ROS: Review of Systems  Constitutional: Negative.   HENT: Negative.    Eyes:  Negative.   Respiratory:  Positive for shortness of breath.   Cardiovascular:  Positive for palpitations.  Gastrointestinal: Negative.   Genitourinary: Negative.   Musculoskeletal: Negative.   Skin: Negative.   Neurological:  Positive for headaches.  Endo/Heme/Allergies: Negative.   Psychiatric/Behavioral:  Positive for memory loss. The patient is nervous/anxious.      Studies Reviewed: .        Cardiac Studies & Procedures   CARDIAC CATHETERIZATION  CARDIAC CATHETERIZATION 03/16/2017  Narrative  Mid RCA lesion, 40 %stenosed.  1st Mrg lesion, 10 %stenosed.  The left ventricular systolic function is normal.  LV end diastolic pressure is normal.  The left ventricular ejection fraction is 55-65% by visual estimate.  There is no aortic valve stenosis.  Nonobstructive CAD.  Continue aggressive preventive therapy including smoking cessation.  Findings Coronary Findings Diagnostic  Dominance: Right  Ramus Intermedius Vessel is small.  Left Circumflex  First Obtuse Marginal Branch  Right Coronary Artery  Intervention  No interventions have been documented.   STRESS TESTS  MYOCARDIAL PERFUSION IMAGING 12/02/2021  Narrative   Findings are consistent with no ischemia and no prior myocardial infarction. The study is low risk.   No ST deviation was noted.   Left ventricular function is normal. Nuclear stress EF: 68 %. The left ventricular ejection fraction is hyperdynamic (>65%). End diastolic cavity size is normal.   Prior study not available for comparison.      CT SCANS  CT CORONARY MORPH W/CTA COR W/SCORE 04/25/2023  Addendum 05/04/2023 10:18 AM  ADDENDUM REPORT: 05/04/2023 10:15  EXAM: OVER-READ INTERPRETATION  CT CHEST  The following report is an over-read performed by radiologist Dr. Curly Shores [facility] on 05/04/2023. This over-read does not include interpretation of cardiac or coronary anatomy or pathology. The coronary CTA  interpretation by the cardiologist is attached.  COMPARISON:  09/29/2021.  FINDINGS: Cardiovascular: Atheromatous calcifications of the aorta. See findings discussed in the body of the report.  Mediastinum/Nodes: No suspicious adenopathy identified. Imaged mediastinal structures are unremarkable.  Lungs/Pleura: There is dependent basilar subsegmental atelectasis. No pneumonia or pulmonary edema. No pleural effusion or pneumothorax.  Upper Abdomen: No acute abnormality.  Musculoskeletal: No chest wall abnormality. No acute or significant osseous findings.  IMPRESSION: No significant extracardiac incidental findings identified.   Electronically Signed By: Layla Maw M.D. On: 05/04/2023 10:15  Narrative CLINICAL DATA:  60F with DOE  EXAM: Cardiac/Coronary CTA  TECHNIQUE: The patient was scanned on a [employer].  FINDINGS: A 100 kV prospective scan was triggered in the descending thoracic aorta at 111 HU's. Axial non-contrast 3 mm slices were carried out through the heart. The data set was analyzed on a dedicated work station and scored using the Agatson method. Gantry rotation speed was 250 msecs and collimation was .6 mm. No beta blockade and 0.8 mg of sl NTG was given. The 3D data set was reconstructed in 5% intervals of the 35-75% of the R-R cycle. Phases were analyzed on a dedicated work station using MPR, MIP and VRT modes. The patient received 100 cc of contrast.  Coronary Arteries:  Normal coronary origin.  Right dominance.  RCA is a large dominant artery that gives rise to PDA and PLA. Mixed plaque in ostial RCA causes 0-24% stenosis. Mixed plaque in proximal RCA causes 50-69% stenosis. Calcified plaque in mid RCA causes 0-24% stenosis. Calcified plaque in distal RCA causes 0-24% stenosis  Left main is a large artery that gives rise to LAD and LCX arteries. Calcified plaque in left main causes 0-24% stenosis  LAD is a large vessel.  Calcified plaque in proximal LAD causes 25-49% stenosis  LCX is a non-dominant artery that gives rise to one large OM1 branch. There is no plaque.  Other findings:  Left Ventricle: Normal size  Left Atrium: Mild enlargement. PFO  Pulmonary Veins: Normal configuration  Right Ventricle: Normal size  Right Atrium: Normal size  Cardiac valves: No calcifications  Thoracic aorta: Normal size  Pulmonary Arteries: Normal size  Systemic Veins: Normal drainage  Pericardium: Normal thickness  IMPRESSION: 1. Coronary calcium score of 436. This was 98th percentile for age and sex matched control.  2. Total plaque volume 320mm3 which is 82nd percentile for age and sex-matched controls (calcified plaque 15mm3; noncalcified plaque 2106mm3). 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  Normal coronary origin with right dominance.  4.  Moderate (50-69%) stenosis in proximal RCA  5.  Mild (25-49%) stenosis in proximal LAD  6. Minimal (0-24%) stenosis in left main and ostial, mid, and distal RCA  7.  PFO  8.  Will send study for CTFFR  CAD-RADS 3. Moderate stenosis. Consider symptom-guided anti-ischemic pharmacotherapy as well as risk factor modification per guideline directed care. Additional analysis with CT FFR will be submitted.  Electronically Signed: By: Epifanio Lesches M.D. On: 04/26/2023 12:29          Risk Assessment/Calculations:     No BP recorded.  {Refresh Note OR Click here to enter BP  :1}***       Physical  Exam:   VS:  There were no vitals taken for this visit.   Wt Readings from Last 3 Encounters:  04/18/23 170 lb (77.1 kg)  12/02/21 173 lb (78.5 kg)  10/22/21 173 lb (78.5 kg)    GEN: Well nourished, well developed in no acute distress NECK: No JVD; No carotid bruits CARDIAC: RRR, no murmurs, rubs, gallops RESPIRATORY:  Clear to auscultation without rales, wheezing or rhonchi  ABDOMEN: Soft, non-tender, non-distended EXTREMITIES:  No edema; No deformity    ASSESSMENT AND PLAN: .   CAD -nonobstructive per left heart cath in 2018, continue aspirin 81 mg daily, continue Lipitor 80 mg daily, continue metoprolol 12.5 mg twice daily, continue nitroglycerin as needed, continue Ranexa 500 mg twice daily.  She has some DOE, which could be an anginal equivalent, will arrange for coronary CTA.  Hypertension-her blood pressure is fairly elevated in the office today, she is not currently on any antihypertensive agents.  Will start her on amlodipine 10 mg daily.  Her caregiver will keep a blood pressure log x 2 weeks and send these to me via MyChart.  Palpitations-will arrange for a 2-week monitor, check CBC, CMET  Tobacco abuse-continues to smoke however is trying to cut down.  She request help, will start her on Chantix.  Hyperlipidemia - on a statin, but no recent lab work. Will check LFTs and direct LDL.        Dispo: cCTA, 2 week monitor, labs, Chantix, return in 4 weeks.  Signed, Flossie Dibble, NP

## 2023-05-16 ENCOUNTER — Ambulatory Visit: Payer: 59 | Attending: Cardiology | Admitting: Cardiology

## 2023-05-16 DIAGNOSIS — I25118 Atherosclerotic heart disease of native coronary artery with other forms of angina pectoris: Secondary | ICD-10-CM

## 2023-05-16 DIAGNOSIS — Q2112 Patent foramen ovale: Secondary | ICD-10-CM

## 2023-05-16 DIAGNOSIS — R002 Palpitations: Secondary | ICD-10-CM

## 2023-05-16 DIAGNOSIS — I10 Essential (primary) hypertension: Secondary | ICD-10-CM

## 2023-05-18 ENCOUNTER — Other Ambulatory Visit: Payer: Self-pay | Admitting: Cardiology

## 2023-05-23 ENCOUNTER — Telehealth: Payer: Self-pay

## 2023-05-23 NOTE — Telephone Encounter (Signed)
Patient called.  Left message for patient to call back.

## 2023-05-23 NOTE — Telephone Encounter (Signed)
-----   Message from Flossie Dibble sent at 05/22/2023  5:31 PM EDT ----- Monitor revealed you are predominantly in sinus rhythm, which is normal, you did have several episodes where your heart speeds up however it was not from a dangerous location and your heart.  This is not necessarily anything we need to treat at this time.

## 2023-05-26 ENCOUNTER — Telehealth: Payer: Self-pay

## 2023-05-26 NOTE — Telephone Encounter (Signed)
Letter mailed to patient with results as patient has been called 3 times and have not gotten a hold of her.

## 2023-07-15 ENCOUNTER — Other Ambulatory Visit: Payer: Self-pay | Admitting: Cardiology

## 2023-07-15 ENCOUNTER — Other Ambulatory Visit: Payer: Self-pay

## 2023-07-15 ENCOUNTER — Other Ambulatory Visit (HOSPITAL_COMMUNITY): Payer: Self-pay

## 2023-07-15 MED ORDER — NITROGLYCERIN 0.4 MG SL SUBL
0.4000 mg | SUBLINGUAL_TABLET | SUBLINGUAL | 3 refills | Status: DC | PRN
  Filled 2023-07-15: qty 25, 9d supply, fill #0

## 2023-10-05 ENCOUNTER — Other Ambulatory Visit: Payer: Self-pay

## 2023-10-05 MED ORDER — AMLODIPINE BESYLATE 10 MG PO TABS
10.0000 mg | ORAL_TABLET | Freq: Every day | ORAL | 2 refills | Status: DC
  Filled 2023-10-05: qty 90, 90d supply, fill #0
  Filled 2023-12-13: qty 90, 90d supply, fill #1

## 2023-10-20 ENCOUNTER — Other Ambulatory Visit (HOSPITAL_COMMUNITY): Payer: Self-pay

## 2023-11-16 ENCOUNTER — Other Ambulatory Visit: Payer: Self-pay | Admitting: Cardiology

## 2023-12-13 ENCOUNTER — Other Ambulatory Visit (HOSPITAL_COMMUNITY): Payer: Self-pay

## 2023-12-14 ENCOUNTER — Telehealth: Payer: Self-pay

## 2023-12-14 ENCOUNTER — Telehealth: Payer: Self-pay | Admitting: Cardiology

## 2023-12-14 ENCOUNTER — Other Ambulatory Visit: Payer: Self-pay

## 2023-12-14 MED ORDER — METOPROLOL TARTRATE 25 MG PO TABS: 12.5000 mg | ORAL_TABLET | Freq: Two times a day (BID) | ORAL | 0 refills | Status: DC

## 2023-12-14 NOTE — Telephone Encounter (Signed)
 Pt called needing Metoprolol until appt on 12-30-23. #20 Metoprolol 25mg  1/2 BID sent to [facility]

## 2023-12-14 NOTE — Telephone Encounter (Signed)
*  STAT* If patient is at the pharmacy, call can be transferred to refill team.   1. Which medications need to be refilled? (please list name of each medication and dose if known) metoprolol tartrate (LOPRESSOR) 25 MG tablet   2. Which pharmacy/location (including street and city if local pharmacy) is medication to be sent to? [facility] [phone] - [place], [place] - [address] AT [facility] [address] Phone: [phone]  Fax: [phone]   3. Do they need a 30 day or 90 day supply? Pt has made appt for 5/2 please refill til than

## 2023-12-27 ENCOUNTER — Other Ambulatory Visit: Payer: Self-pay

## 2023-12-27 DIAGNOSIS — I10 Essential (primary) hypertension: Secondary | ICD-10-CM | POA: Insufficient documentation

## 2023-12-29 NOTE — Progress Notes (Signed)
 Cardiology Office Note:  .   Date:  12/30/2023  ID:  Kathy Moss, DOB Apr 21, 1961, MRN [id number] PCP: Mina Alter, FNP  [facility] Providers Cardiologist:  Ralene Burger, MD    History of Present Illness: .    Kathy Moss is a 63 y.o. female with a past medical history of CAD nonobstructive per left heart cath in 2018, hypertension, hypotension, migraine, TIA, COPD 2 lpm, DM 2, CKD stage IIIa, seizure disorder, dyslipidemia, tobacco abuse.  04/26/2023 coronary CTA calcium  score 436, 90th percentile, T PV with severe, FFR revealed likely no hemodynamic significance 04/18/2023 monitor average heart rate 85 bpm, predominant rhythm was sinus, 8 episodes of SVT, VE burden 1.2% 12/03/2021 Lexiscan  low risk study 03/16/2017 left heart cath nonobstructive CAD  Evaluated by Dr. Gordan Latina on 10/22/2021, she was stable from a cardiac perspective, trying to stop smoking.  Lexiscan  was arranged which revealed low risk study.  Evaluated by myself most recently in August to 2024 she was accompanied by her caregiver, she had some DOE but felt it was related to her COPD.  She was bothered of feeling like her heart was racing at time.  We arranged for a monitor which was overall unremarkable, as well as a coronary CTA revealed a calcium  score of 436, FFR revealed not hemodynamically significant.  We also started her on Chantix  to help with smoking cessation and advised her to follow-up in 4 weeks.  She presents today accompanied by her caregiver/niece, since she was last evaluated in our office she was able to completely stop smoking however she is now using oral nicotine, she is also gained approximately 15 pounds and she contributes this to increase caloric intake.  She has have had episodes of chest pain as well that sound to be consistent with angina, was able to take nitroglycerin  with relief.  Relatively complicated visit with multiple concerns that were addressed.  She also mention she notes her  heart rate has been dropping, EKG today her heart rate is in the 40s sinus bradycardia, this is accompanied by lightheadedness.  She denies  palpitations, dyspnea, pnd, orthopnea, n, v,  syncope, edema,  or early satiety.    ROS: Review of Systems  Constitutional: Negative.   HENT: Negative.    Eyes: Negative.   Respiratory:  Positive for shortness of breath.   Cardiovascular:  Positive for chest pain.  Gastrointestinal: Negative.   Genitourinary: Negative.   Musculoskeletal: Negative.   Skin: Negative.   Neurological:  Positive for dizziness.  Endo/Heme/Allergies: Negative.   Psychiatric/Behavioral:  Positive for memory loss.      Studies Reviewed: Aaron Aas   EKG Interpretation Date/Time:  Friday Dec 30 2023 09:57:46 EDT Ventricular Rate:  48 PR Interval:  152 QRS Duration:  80 QT Interval:  488 QTC Calculation: 435 R Axis:   60  Text Interpretation: Sinus bradycardia When compared with ECG of 18-Apr-2023 15:42, No significant change was found Confirmed by Pattricia Bores [phone]) on 12/30/2023 9:59:32 AM    Cardiac Studies & Procedures   ______________________________________________________________________________________________ CARDIAC CATHETERIZATION  CARDIAC CATHETERIZATION 03/16/2017  Conclusion  Mid RCA lesion, 40 %stenosed.  1st Mrg lesion, 10 %stenosed.  The left ventricular systolic function is normal.  LV end diastolic pressure is normal.  The left ventricular ejection fraction is 55-65% by visual estimate.  There is no aortic valve stenosis.  Nonobstructive CAD.  Continue aggressive preventive therapy including smoking cessation.  Findings Coronary Findings Diagnostic  Dominance: Right  Ramus Intermedius Vessel is  small.  Left Circumflex  First Obtuse Marginal Branch  Right Coronary Artery  Intervention  No interventions have been documented.   STRESS TESTS  MYOCARDIAL PERFUSION IMAGING 12/02/2021  Narrative   Findings are consistent with no  ischemia and no prior myocardial infarction. The study is low risk.   No ST deviation was noted.   Left ventricular function is normal. Nuclear stress EF: 68 %. The left ventricular ejection fraction is hyperdynamic (>65%). End diastolic cavity size is normal.   Prior study not available for comparison.      MONITORS  LONG TERM MONITOR (3-14 DAYS) 05/12/2023  Narrative Patch Wear Time:  13 days and 23 hours (2024-08-19T16:37:23-0400 to 2024-09-02T15:56:09-398)  Patient had a min HR of 38 bpm, max HR of 164 bpm, and avg HR of 55 bpm. Predominant underlying rhythm was Sinus Rhythm. 8 Supraventricular Tachycardia runs occurred, the run with the fastest interval lasting 4 beats with a max rate of 164 bpm, the longest lasting 19 beats with an avg rate of 96 bpm. Isolated SVEs were rare (<1.0%), SVE Couplets were rare (<1.0%), and SVE Triplets were rare (<1.0%). Isolated VEs were occasional (1.2%, 13094), VE Couplets were rare (<1.0%, 1), and VE Triplets were rare (<1.0%, 1). Ventricular Trigeminy was present. MD notification criteria for Symptomatic Bradycardia met - report posted prior to notification per account request (MB).  Summary and conclusions: 8 episodes of supraventricular tachycardia longest episode 19 beats at rate of 96. Isolated ventricular ectopy with total burden of 1.2%.   CT SCANS  CT CORONARY MORPH W/CTA COR W/SCORE 04/25/2023  Addendum 05/04/2023 10:18 AM ADDENDUM REPORT: 05/04/2023 10:15  EXAM: OVER-READ INTERPRETATION  CT CHEST  The following report is an over-read performed by radiologist Dr. Cyndia Drape [facility] on 05/04/2023. This over-read does not include interpretation of cardiac or coronary anatomy or pathology. The coronary CTA interpretation by the cardiologist is attached.  COMPARISON:  09/29/2021.  FINDINGS: Cardiovascular: Atheromatous calcifications of the aorta. See findings discussed in the body of the  report.  Mediastinum/Nodes: No suspicious adenopathy identified. Imaged mediastinal structures are unremarkable.  Lungs/Pleura: There is dependent basilar subsegmental atelectasis. No pneumonia or pulmonary edema. No pleural effusion or pneumothorax.  Upper Abdomen: No acute abnormality.  Musculoskeletal: No chest wall abnormality. No acute or significant osseous findings.  IMPRESSION: No significant extracardiac incidental findings identified.   Electronically Signed By: Sydell Eva M.D. On: 05/04/2023 10:15  Narrative CLINICAL DATA:  80F with DOE  EXAM: Cardiac/Coronary CTA  TECHNIQUE: The patient was scanned on a [employer].  FINDINGS: A 100 kV prospective scan was triggered in the descending thoracic aorta at 111 HU's. Axial non-contrast 3 mm slices were carried out through the heart. The data set was analyzed on a dedicated work station and scored using the Agatson method. Gantry rotation speed was 250 msecs and collimation was .6 mm. No beta blockade and 0.8 mg of sl NTG was given. The 3D data set was reconstructed in 5% intervals of the 35-75% of the R-R cycle. Phases were analyzed on a dedicated work station using MPR, MIP and VRT modes. The patient received 100 cc of contrast.  Coronary Arteries:  Normal coronary origin.  Right dominance.  RCA is a large dominant artery that gives rise to PDA and PLA. Mixed plaque in ostial RCA causes 0-24% stenosis. Mixed plaque in proximal RCA causes 50-69% stenosis. Calcified plaque in mid RCA causes 0-24% stenosis. Calcified plaque in distal RCA causes 0-24% stenosis  Left  main is a large artery that gives rise to LAD and LCX arteries. Calcified plaque in left main causes 0-24% stenosis  LAD is a large vessel. Calcified plaque in proximal LAD causes 25-49% stenosis  LCX is a non-dominant artery that gives rise to one large OM1 branch. There is no plaque.  Other findings:  Left Ventricle: Normal  size  Left Atrium: Mild enlargement. PFO  Pulmonary Veins: Normal configuration  Right Ventricle: Normal size  Right Atrium: Normal size  Cardiac valves: No calcifications  Thoracic aorta: Normal size  Pulmonary Arteries: Normal size  Systemic Veins: Normal drainage  Pericardium: Normal thickness  IMPRESSION: 1. Coronary calcium  score of 436. This was 98th percentile for age and sex matched control.  2. Total plaque volume 318mm3 which is 82nd percentile for age and sex-matched controls (calcified plaque 76mm3; noncalcified plaque 239mm3). 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  Normal coronary origin with right dominance.  4.  Moderate (50-69%) stenosis in proximal RCA  5.  Mild (25-49%) stenosis in proximal LAD  6. Minimal (0-24%) stenosis in left main and ostial, mid, and distal RCA  7.  PFO  8.  Will send study for CTFFR  CAD-RADS 3. Moderate stenosis. Consider symptom-guided anti-ischemic pharmacotherapy as well as risk factor modification per guideline directed care. Additional analysis with CT FFR will be submitted.  Electronically Signed: By: Carson Clara M.D. On: 04/26/2023 12:29     ______________________________________________________________________________________________      Risk Assessment/Calculations:             Physical Exam:   VS:  BP 110/70   Pulse (!) 46   Ht 5\' 5"  (1.651 m)   Wt 185 lb (83.9 kg)   SpO2 94%   BMI 30.79 kg/m    Wt Readings from Last 3 Encounters:  12/30/23 185 lb (83.9 kg)  04/18/23 170 lb (77.1 kg)  12/02/21 173 lb (78.5 kg)    GEN: Well nourished, well developed in no acute distress NECK: No JVD; No carotid bruits CARDIAC: RRR, no murmurs, rubs, gallops RESPIRATORY:  Clear to auscultation without rales, wheezing or rhonchi  ABDOMEN: Soft, non-tender, non-distended EXTREMITIES:  No edema; No deformity   ASSESSMENT AND PLAN: .   CAD -nonobstructive per left heart cath in 2018 >> coronary CTA 2024 calcium  score  436, 90th percentile, TPV with severe, FFR revealed likely no hemodynamic significance, continue aspirin  81 mg daily, continue Lipitor 80 mg daily, continue nitroglycerin  as needed, continue Ranexa  but will increase to 1000 mg twice daily. On metoprolol  but we will d/c this as she is bradycardiac and symptomatic.   Hypertension-her blood pressure is well-controlled, continue Norvasc  10 mg daily.  Bradycardia-she is also symptomatic, we will simply discontinue her metoprolol , will wean her off it and have her take it every other day for a week, then twice a day for 1 week and then stop.  Tobacco abuse/chewing tobacco-complete cessation since we gave her Chantix  last year however she has replaced this with oral nicotine which she is also trying to stop.  Congratulated her on stopping cigarettes.  Hyperlipidemia -  Her most recent LDL was elevated at 207 and we increased her Lipitor to 80 mg but she did not come back for repeat testing.  Will repeat LFTs and direct LDL today, as well as LP(a).     DM2-she has not seen her PCP in a while, will check her A1c, she has also had weight gain of 15 pounds which is most likely from increased caloric  intake after stopping smoking.  I want a put her on Ozempic  for cardiovascular benefits as well as DM2.  We discussed side effects of Ozempic , and that she can now eat and outs that any medication that we give her so encouraged her to start exercising to the best of her ability and watching her caloric intake.  Dispo: Stop metoprolol .  Start Ozempic  0.25 Milligrams injected in the skin once a week x 4, then 0.5 mg injected in skin once a week x 4 weeks, then 1 mg injected into the skin once weekly x 4 weeks.  Increase Ranexa  to 1000 mg twice daily.  CBC, CMET, direct LDL, LP(a), hemoglobin A1c.  Follow-up in 3 months. I spent > 40 minutes of direct patient care today.   Signed, Terrance Ferretti, NP

## 2023-12-30 ENCOUNTER — Ambulatory Visit: Attending: Cardiology | Admitting: Cardiology

## 2023-12-30 ENCOUNTER — Other Ambulatory Visit (HOSPITAL_BASED_OUTPATIENT_CLINIC_OR_DEPARTMENT_OTHER): Payer: Self-pay

## 2023-12-30 ENCOUNTER — Encounter: Payer: Self-pay | Admitting: Cardiology

## 2023-12-30 VITALS — BP 110/70 | HR 46 | Ht 65.0 in | Wt 185.0 lb

## 2023-12-30 DIAGNOSIS — I10 Essential (primary) hypertension: Secondary | ICD-10-CM

## 2023-12-30 DIAGNOSIS — R002 Palpitations: Secondary | ICD-10-CM

## 2023-12-30 DIAGNOSIS — E118 Type 2 diabetes mellitus with unspecified complications: Secondary | ICD-10-CM

## 2023-12-30 DIAGNOSIS — I25118 Atherosclerotic heart disease of native coronary artery with other forms of angina pectoris: Secondary | ICD-10-CM

## 2023-12-30 DIAGNOSIS — Z794 Long term (current) use of insulin: Secondary | ICD-10-CM

## 2023-12-30 DIAGNOSIS — F17220 Nicotine dependence, chewing tobacco, uncomplicated: Secondary | ICD-10-CM

## 2023-12-30 DIAGNOSIS — R001 Bradycardia, unspecified: Secondary | ICD-10-CM

## 2023-12-30 DIAGNOSIS — Z720 Tobacco use: Secondary | ICD-10-CM

## 2023-12-30 MED ORDER — RANOLAZINE ER 1000 MG PO TB12
1000.0000 mg | ORAL_TABLET | Freq: Two times a day (BID) | ORAL | 3 refills | Status: DC
  Filled 2023-12-30: qty 180, 90d supply, fill #0

## 2023-12-30 MED ORDER — NITROGLYCERIN 0.4 MG SL SUBL: 0.4000 mg | SUBLINGUAL_TABLET | SUBLINGUAL | 3 refills | Status: DC | PRN

## 2023-12-30 MED ORDER — OZEMPIC (0.25 OR 0.5 MG/DOSE) 2 MG/3ML ~~LOC~~ SOPN
PEN_INJECTOR | SUBCUTANEOUS | 0 refills | Status: DC
  Filled 2023-12-30: qty 6, 56d supply, fill #0

## 2023-12-30 MED ORDER — CLOPIDOGREL BISULFATE 75 MG PO TABS: 75.0000 mg | ORAL_TABLET | Freq: Every day | ORAL | 3 refills | Status: DC

## 2023-12-30 MED ORDER — METOPROLOL TARTRATE 25 MG PO TABS: 12.5000 mg | ORAL_TABLET | Freq: Two times a day (BID) | ORAL | 3 refills | Status: DC

## 2023-12-30 NOTE — Patient Instructions (Signed)
 Medication Instructions:  Your physician has recommended you make the following change in your medication:   START: Ranexa  1000 mg two times daily START: Ozempic  take as prescribed STOP: Metoprolol  (Take the medication every other day for 1 week, then 2 days for week 2, then stop the medication.)  *If you need a refill on your cardiac medications before your next appointment, please call your pharmacy*  Lab Work: Your physician recommends that you return for lab work in:  Labs today: CMP, CBC, Direct LDL, Lpa, Hbg A1c  If you have labs (blood work) drawn today and your tests are completely normal, you will receive your results only by: MyChart Message (if you have MyChart) OR A paper copy in the mail If you have any lab test that is abnormal or we need to change your treatment, we will call you to review the results.  Testing/Procedures: None  Follow-Up: At [facility], you and your health needs are our priority.  As part of our continuing mission to provide you with exceptional heart care, our providers are all part of one team.  This team includes your primary Cardiologist (physician) and Advanced Practice Providers or APPs (Physician Assistants and Nurse Practitioners) who all work together to provide you with the care you need, when you need it.  Your next appointment:   3 month(s)  Provider:   Pattricia Bores, NP Georgeana Kindler)    We recommend signing up for the patient portal called "MyChart".  Sign up information is provided on this After Visit Summary.  MyChart is used to connect with patients for Virtual Visits (Telemedicine).  Patients are able to view lab/test results, encounter notes, upcoming appointments, etc.  Non-urgent messages can be sent to your provider as well.   To learn more about what you can do with MyChart, go to [email or web address].   Other Instructions None

## 2024-01-02 LAB — CBC
Hematocrit: 45.2 % (ref 34.0–46.6)
Hemoglobin: 15.1 g/dL (ref 11.1–15.9)
MCH: 32.2 pg (ref 26.6–33.0)
MCHC: 33.4 g/dL (ref 31.5–35.7)
MCV: 96 fL (ref 79–97)
Platelets: 229 x10E3/uL (ref 150–450)
RBC: 4.69 x10E6/uL (ref 3.77–5.28)
RDW: 13.3 % (ref 11.7–15.4)
WBC: 7.7 x10E3/uL (ref 3.4–10.8)

## 2024-01-02 LAB — COMPREHENSIVE METABOLIC PANEL WITH GFR
ALT: 11 IU/L (ref 0–32)
AST: 12 IU/L (ref 0–40)
Albumin: 4.3 g/dL (ref 3.9–4.9)
Alkaline Phosphatase: 120 IU/L (ref 44–121)
BUN/Creatinine Ratio: 21 (ref 12–28)
BUN: 27 mg/dL (ref 8–27)
Bilirubin Total: 0.3 mg/dL (ref 0.0–1.2)
CO2: 21 mmol/L (ref 20–29)
Calcium: 9.4 mg/dL (ref 8.7–10.3)
Chloride: 107 mmol/L — ABNORMAL HIGH (ref 96–106)
Creatinine, Ser: 1.31 mg/dL — ABNORMAL HIGH (ref 0.57–1.00)
Globulin, Total: 2.1 g/dL (ref 1.5–4.5)
Glucose: 87 mg/dL (ref 70–99)
Potassium: 5 mmol/L (ref 3.5–5.2)
Sodium: 145 mmol/L — ABNORMAL HIGH (ref 134–144)
Total Protein: 6.4 g/dL (ref 6.0–8.5)
eGFR: 46 mL/min/1.73 — ABNORMAL LOW

## 2024-01-02 LAB — LIPOPROTEIN A (LPA): Lipoprotein (a): 89.2 nmol/L — ABNORMAL HIGH

## 2024-01-02 LAB — HEMOGLOBIN A1C
Est. average glucose Bld gHb Est-mCnc: 120 mg/dL
Hgb A1c MFr Bld: 5.8 % — ABNORMAL HIGH (ref 4.8–5.6)

## 2024-01-02 LAB — LDL CHOLESTEROL, DIRECT: LDL Direct: 80 mg/dL (ref 0–99)

## 2024-01-20 ENCOUNTER — Telehealth: Payer: Self-pay | Admitting: Cardiology

## 2024-01-20 NOTE — Telephone Encounter (Signed)
 Spoke with Camilo Cella per DPR who states that the pt feels her BP is low and C/O being lightheaded. BP is 102/77 and 116/78. Advised to stay hydrated and check blood sugar, Camilo Cella states that she feels she is okay and was saying this to get out of her pap smear this am. Advised to callback for other concerns.  Camilo Cella verbalized understanding and had no additional questions.

## 2024-01-20 NOTE — Telephone Encounter (Signed)
 Pt c/o medication issue:  1. Name of Medication:   Semaglutide ,0.25 or 0.5MG /DOS, (OZEMPIC , 0.25 OR 0.5 MG/DOSE,) 2 MG/3ML SOPN  ranolazine  (RANEXA ) 1000 MG SR tablet   2. How are you currently taking this medication (dosage and times per day)? As written  3. Are you having a reaction (difficulty breathing--STAT)? no  4. What is your medication issue? Per pt she feels like she is going to pass out  and feels like it is one of these medications

## 2024-01-29 ENCOUNTER — Other Ambulatory Visit: Payer: Self-pay | Admitting: Cardiology

## 2024-02-17 ENCOUNTER — Other Ambulatory Visit: Payer: Self-pay | Admitting: Cardiology

## 2024-02-21 ENCOUNTER — Other Ambulatory Visit: Payer: Self-pay

## 2024-02-21 ENCOUNTER — Other Ambulatory Visit: Payer: Self-pay | Admitting: Cardiology

## 2024-02-21 DIAGNOSIS — Z794 Long term (current) use of insulin: Secondary | ICD-10-CM

## 2024-02-21 MED ORDER — RANOLAZINE ER 1000 MG PO TB12: 1000.0000 mg | ORAL_TABLET | Freq: Two times a day (BID) | ORAL | 3 refills | Status: DC

## 2024-02-21 MED ORDER — AMLODIPINE BESYLATE 10 MG PO TABS: 10.0000 mg | ORAL_TABLET | Freq: Every day | ORAL | 3 refills | Status: DC

## 2024-02-21 MED ORDER — ATORVASTATIN CALCIUM 80 MG PO TABS: 80.0000 mg | ORAL_TABLET | Freq: Every day | ORAL | 3 refills | Status: DC

## 2024-02-22 ENCOUNTER — Other Ambulatory Visit (HOSPITAL_BASED_OUTPATIENT_CLINIC_OR_DEPARTMENT_OTHER): Payer: Self-pay

## 2024-02-22 MED ORDER — OZEMPIC (0.25 OR 0.5 MG/DOSE) 2 MG/3ML ~~LOC~~ SOPN
0.5000 mg | PEN_INJECTOR | SUBCUTANEOUS | 0 refills | Status: DC
  Filled 2024-02-22: qty 3, 28d supply, fill #0

## 2024-03-06 ENCOUNTER — Other Ambulatory Visit (HOSPITAL_BASED_OUTPATIENT_CLINIC_OR_DEPARTMENT_OTHER): Payer: Self-pay

## 2024-04-03 ENCOUNTER — Other Ambulatory Visit: Payer: Self-pay

## 2024-04-03 NOTE — Progress Notes (Deleted)
 Cardiology Office Note:  .   Date:  04/03/2024  ID:  Kathy Moss, DOB 1960/10/21, MRN [id number] PCP: Jackolyn Darice BROCKS, FNP  [facility] Providers Cardiologist:  Lamar Fitch, MD    History of Present Illness: .    Kathy Moss is a 63 y.o. female with a past medical history of CAD nonobstructive per left heart cath in 2018, hypertension, hypotension, migraine, TIA, COPD 2 lpm, DM 2, CKD stage IIIa, seizure disorder, dyslipidemia, tobacco abuse.  04/26/2023 coronary CTA calcium  score 436, 90th percentile, T PV with severe, FFR revealed likely no hemodynamic significance 04/18/2023 monitor average heart rate 85 bpm, predominant rhythm was sinus, 8 episodes of SVT, VE burden 1.2% 12/03/2021 Lexiscan  low risk study 03/16/2017 left heart cath nonobstructive CAD  Evaluated by Dr. Fitch on 10/22/2021, she was stable from a cardiac perspective, trying to stop smoking.  Lexiscan  was arranged which revealed low risk study.  Evaluated by myself most recently in August to 2024 she was accompanied by her caregiver, she had some DOE but felt it was related to her COPD.  She was bothered of feeling like her heart was racing at time.  We arranged for a monitor which was overall unremarkable, as well as a coronary CTA revealed a calcium  score of 436, FFR revealed not hemodynamically significant.  We also started her on Chantix  to help with smoking cessation and advised her to follow-up in 4 weeks.  She presents today accompanied by her caregiver/niece, since she was last evaluated in our office she was able to completely stop smoking however she is now using oral nicotine, she is also gained approximately 15 pounds and she contributes this to increase caloric intake.  She has have had episodes of chest pain as well that sound to be consistent with angina, was able to take nitroglycerin  with relief.  Relatively complicated visit with multiple concerns that were addressed.  She also mention she notes her  heart rate has been dropping, EKG today her heart rate is in the 40s sinus bradycardia, this is accompanied by lightheadedness.  She denies  palpitations, dyspnea, pnd, orthopnea, n, v,  syncope, edema,  or early satiety.    ROS: Review of Systems  Constitutional: Negative.   HENT: Negative.    Eyes: Negative.   Respiratory:  Positive for shortness of breath.   Cardiovascular:  Positive for chest pain.  Gastrointestinal: Negative.   Genitourinary: Negative.   Musculoskeletal: Negative.   Skin: Negative.   Neurological:  Positive for dizziness.  Endo/Heme/Allergies: Negative.   Psychiatric/Behavioral:  Positive for memory loss.      Studies Reviewed: .        Cardiac Studies & Procedures   ______________________________________________________________________________________________ CARDIAC CATHETERIZATION  CARDIAC CATHETERIZATION 03/16/2017  Conclusion  Mid RCA lesion, 40 %stenosed.  1st Mrg lesion, 10 %stenosed.  The left ventricular systolic function is normal.  LV end diastolic pressure is normal.  The left ventricular ejection fraction is 55-65% by visual estimate.  There is no aortic valve stenosis.  Nonobstructive CAD.  Continue aggressive preventive therapy including smoking cessation.  Findings Coronary Findings Diagnostic  Dominance: Right  Ramus Intermedius Vessel is small.  Left Circumflex  First Obtuse Marginal Branch  Right Coronary Artery  Intervention  No interventions have been documented.   STRESS TESTS  MYOCARDIAL PERFUSION IMAGING 12/02/2021  Interpretation Summary   Findings are consistent with no ischemia and no prior myocardial infarction. The study is low risk.   No ST deviation was noted.  Left ventricular function is normal. Nuclear stress EF: 68 %. The left ventricular ejection fraction is hyperdynamic (>65%). End diastolic cavity size is normal.   Prior study not available for comparison.      MONITORS  LONG TERM  MONITOR (3-14 DAYS) 05/12/2023  Narrative Patch Wear Time:  13 days and 23 hours (2024-08-19T16:37:23-0400 to 2024-09-02T15:56:09-398)  Patient had a min HR of 38 bpm, max HR of 164 bpm, and avg HR of 55 bpm. Predominant underlying rhythm was Sinus Rhythm. 8 Supraventricular Tachycardia runs occurred, the run with the fastest interval lasting 4 beats with a max rate of 164 bpm, the longest lasting 19 beats with an avg rate of 96 bpm. Isolated SVEs were rare (<1.0%), SVE Couplets were rare (<1.0%), and SVE Triplets were rare (<1.0%). Isolated VEs were occasional (1.2%, 13094), VE Couplets were rare (<1.0%, 1), and VE Triplets were rare (<1.0%, 1). Ventricular Trigeminy was present. MD notification criteria for Symptomatic Bradycardia met - report posted prior to notification per account request (MB).  Summary and conclusions: 8 episodes of supraventricular tachycardia longest episode 19 beats at rate of 96. Isolated ventricular ectopy with total burden of 1.2%.   CT SCANS  CT CORONARY MORPH W/CTA COR W/SCORE 04/25/2023  Addendum 05/04/2023 10:18 AM ADDENDUM REPORT: 05/04/2023 10:15  EXAM: OVER-READ INTERPRETATION  CT CHEST  The following report is an over-read performed by radiologist Dr. Fonda Mom [facility] on 05/04/2023. This over-read does not include interpretation of cardiac or coronary anatomy or pathology. The coronary CTA interpretation by the cardiologist is attached.  COMPARISON:  09/29/2021.  FINDINGS: Cardiovascular: Atheromatous calcifications of the aorta. See findings discussed in the body of the report.  Mediastinum/Nodes: No suspicious adenopathy identified. Imaged mediastinal structures are unremarkable.  Lungs/Pleura: There is dependent basilar subsegmental atelectasis. No pneumonia or pulmonary edema. No pleural effusion or pneumothorax.  Upper Abdomen: No acute abnormality.  Musculoskeletal: No chest wall abnormality. No acute or  significant osseous findings.  IMPRESSION: No significant extracardiac incidental findings identified.   Electronically Signed By: Fonda Field M.D. On: 05/04/2023 10:15  Narrative CLINICAL DATA:  51F with DOE  EXAM: Cardiac/Coronary CTA  TECHNIQUE: The patient was scanned on a [employer].  FINDINGS: A 100 kV prospective scan was triggered in the descending thoracic aorta at 111 HU's. Axial non-contrast 3 mm slices were carried out through the heart. The data set was analyzed on a dedicated work station and scored using the Agatson method. Gantry rotation speed was 250 msecs and collimation was .6 mm. No beta blockade and 0.8 mg of sl NTG was given. The 3D data set was reconstructed in 5% intervals of the 35-75% of the R-R cycle. Phases were analyzed on a dedicated work station using MPR, MIP and VRT modes. The patient received 100 cc of contrast.  Coronary Arteries:  Normal coronary origin.  Right dominance.  RCA is a large dominant artery that gives rise to PDA and PLA. Mixed plaque in ostial RCA causes 0-24% stenosis. Mixed plaque in proximal RCA causes 50-69% stenosis. Calcified plaque in mid RCA causes 0-24% stenosis. Calcified plaque in distal RCA causes 0-24% stenosis  Left main is a large artery that gives rise to LAD and LCX arteries. Calcified plaque in left main causes 0-24% stenosis  LAD is a large vessel. Calcified plaque in proximal LAD causes 25-49% stenosis  LCX is a non-dominant artery that gives rise to one large OM1 branch. There is no plaque.  Other findings:  Left Ventricle:  Normal size  Left Atrium: Mild enlargement. PFO  Pulmonary Veins: Normal configuration  Right Ventricle: Normal size  Right Atrium: Normal size  Cardiac valves: No calcifications  Thoracic aorta: Normal size  Pulmonary Arteries: Normal size  Systemic Veins: Normal drainage  Pericardium: Normal thickness  IMPRESSION: 1. Coronary calcium   score of 436. This was 98th percentile for age and sex matched control.  2. Total plaque volume 329mm3 which is 82nd percentile for age and sex-matched controls (calcified plaque 76mm3; noncalcified plaque 226mm3). 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  Normal coronary origin with right dominance.  4.  Moderate (50-69%) stenosis in proximal RCA  5.  Mild (25-49%) stenosis in proximal LAD  6. Minimal (0-24%) stenosis in left main and ostial, mid, and distal RCA  7.  PFO  8.  Will send study for CTFFR  CAD-RADS 3. Moderate stenosis. Consider symptom-guided anti-ischemic pharmacotherapy as well as risk factor modification per guideline directed care. Additional analysis with CT FFR will be submitted.  Electronically Signed: By: Lonni Nanas M.D. On: 04/26/2023 12:29     ______________________________________________________________________________________________      Risk Assessment/Calculations:     No BP recorded.  {Refresh Note OR Click here to enter BP  :1}***       Physical Exam:   VS:  There were no vitals taken for this visit.   Wt Readings from Last 3 Encounters:  12/30/23 185 lb (83.9 kg)  04/18/23 170 lb (77.1 kg)  12/02/21 173 lb (78.5 kg)    GEN: Well nourished, well developed in no acute distress NECK: No JVD; No carotid bruits CARDIAC: RRR, no murmurs, rubs, gallops RESPIRATORY:  Clear to auscultation without rales, wheezing or rhonchi  ABDOMEN: Soft, non-tender, non-distended EXTREMITIES:  No edema; No deformity   ASSESSMENT AND PLAN: .   CAD -nonobstructive per left heart cath in 2018 >> coronary CTA 2024 calcium  score 436, 90th percentile, TPV with severe, FFR revealed likely no hemodynamic significance, continue aspirin  81 mg daily, continue Lipitor 80 mg daily, continue nitroglycerin  as needed, continue Ranexa  but will increase to 1000 mg twice daily. On metoprolol  but we will d/c this as she is bradycardiac and symptomatic.   Hypertension-her blood pressure  is well-controlled, continue Norvasc  10 mg daily.  Bradycardia-she is also symptomatic, we will simply discontinue her metoprolol , will wean her off it and have her take it every other day for a week, then twice a day for 1 week and then stop.  Tobacco abuse/chewing tobacco-complete cessation since we gave her Chantix  last year however she has replaced this with oral nicotine which she is also trying to stop.  Congratulated her on stopping cigarettes.  Hyperlipidemia -  Her most recent LDL was elevated at 207 and we increased her Lipitor to 80 mg but she did not come back for repeat testing.  Will repeat LFTs and direct LDL today, as well as LP(a).     DM2-she has not seen her PCP in a while, will check her A1c, she has also had weight gain of 15 pounds which is most likely from increased caloric intake after stopping smoking.  I want a put her on Ozempic  for cardiovascular benefits as well as DM2.  We discussed side effects of Ozempic , and that she can now eat and outs that any medication that we give her so encouraged her to start exercising to the best of her ability and watching her caloric intake.  Dispo: Stop metoprolol .  Start Ozempic  0.25 Milligrams injected in the  skin once a week x 4, then 0.5 mg injected in skin once a week x 4 weeks, then 1 mg injected into the skin once weekly x 4 weeks.  Increase Ranexa  to 1000 mg twice daily.  CBC, CMET, direct LDL, LP(a), hemoglobin A1c.  Follow-up in 3 months. I spent > 40 minutes of direct patient care today.   Signed, Delon JAYSON Hoover, NP

## 2024-04-04 ENCOUNTER — Ambulatory Visit: Admitting: Cardiology

## 2024-04-04 ENCOUNTER — Other Ambulatory Visit: Payer: Self-pay | Admitting: Cardiology

## 2024-04-11 ENCOUNTER — Other Ambulatory Visit (HOSPITAL_BASED_OUTPATIENT_CLINIC_OR_DEPARTMENT_OTHER): Payer: Self-pay

## 2024-04-30 NOTE — Progress Notes (Deleted)
 Cardiology Office Note:  .   Date:  05/01/2024  ID:  HETAL PROANO, DOB 09-25-1960, MRN [id number] PCP: Jackolyn Darice BROCKS, FNP  [facility] Providers Cardiologist:  Lamar Fitch, MD    History of Present Illness: .    Kathy Moss is a 63 y.o. female with a past medical history of CAD nonobstructive per left heart cath in 2018, hypertension, hypotension, migraine, TIA, COPD 2 lpm, DM 2, CKD stage IIIa, seizure disorder, dyslipidemia, tobacco abuse.  04/26/2023 coronary CTA calcium  score 436, 90th percentile, T PV with severe, FFR revealed likely no hemodynamic significance 04/18/2023 monitor average heart rate 85 bpm, predominant rhythm was sinus, 8 episodes of SVT, VE burden 1.2% 12/03/2021 Lexiscan  low risk study 03/16/2017 left heart cath nonobstructive CAD  Evaluated by Dr. Fitch on 10/22/2021, she was stable from a cardiac perspective, trying to stop smoking.  Lexiscan  was arranged which revealed low risk study.  Evaluated by myself most recently in August to 2024 she was accompanied by her caregiver, she had some DOE but felt it was related to her COPD.  She was bothered of feeling like her heart was racing at time.  We arranged for a monitor which was overall unremarkable, as well as a coronary CTA revealed a calcium  score of 436, FFR revealed not hemodynamically significant.  We also started her on Chantix  to help with smoking cessation and advised her to follow-up in 4 weeks.  Most recently evaluated by myself on 12/30/2023, she had completely stop smoking but also gained 15 pounds, she was bradycardic and symptomatic so we stopped her beta-blocker, we increased her Ranexa  to see if that would help with episodes of chest pain, and started her on Ozempic  with plans to follow-up in 3 months.       ROS: Review of Systems  Constitutional: Negative.   HENT: Negative.    Eyes: Negative.   Respiratory:  Positive for shortness of breath.   Cardiovascular:  Positive for chest  pain.  Gastrointestinal: Negative.   Genitourinary: Negative.   Musculoskeletal: Negative.   Skin: Negative.   Neurological:  Positive for dizziness.  Endo/Heme/Allergies: Negative.   Psychiatric/Behavioral:  Positive for memory loss.      Studies Reviewed: .        Cardiac Studies & Procedures   ______________________________________________________________________________________________ CARDIAC CATHETERIZATION  CARDIAC CATHETERIZATION 03/16/2017  Conclusion  Mid RCA lesion, 40 %stenosed.  1st Mrg lesion, 10 %stenosed.  The left ventricular systolic function is normal.  LV end diastolic pressure is normal.  The left ventricular ejection fraction is 55-65% by visual estimate.  There is no aortic valve stenosis.  Nonobstructive CAD.  Continue aggressive preventive therapy including smoking cessation.  Findings Coronary Findings Diagnostic  Dominance: Right  Ramus Intermedius Vessel is small.  Left Circumflex  First Obtuse Marginal Branch  Right Coronary Artery  Intervention  No interventions have been documented.   STRESS TESTS  MYOCARDIAL PERFUSION IMAGING 12/02/2021  Interpretation Summary   Findings are consistent with no ischemia and no prior myocardial infarction. The study is low risk.   No ST deviation was noted.   Left ventricular function is normal. Nuclear stress EF: 68 %. The left ventricular ejection fraction is hyperdynamic (>65%). End diastolic cavity size is normal.   Prior study not available for comparison.      MONITORS  LONG TERM MONITOR (3-14 DAYS) 05/12/2023  Narrative Patch Wear Time:  13 days and 23 hours (2024-08-19T16:37:23-0400 to 2024-09-02T15:56:09-398)  Patient had a min HR  of 38 bpm, max HR of 164 bpm, and avg HR of 55 bpm. Predominant underlying rhythm was Sinus Rhythm. 8 Supraventricular Tachycardia runs occurred, the run with the fastest interval lasting 4 beats with a max rate of 164 bpm, the longest lasting 19  beats with an avg rate of 96 bpm. Isolated SVEs were rare (<1.0%), SVE Couplets were rare (<1.0%), and SVE Triplets were rare (<1.0%). Isolated VEs were occasional (1.2%, 13094), VE Couplets were rare (<1.0%, 1), and VE Triplets were rare (<1.0%, 1). Ventricular Trigeminy was present. MD notification criteria for Symptomatic Bradycardia met - report posted prior to notification per account request (MB).  Summary and conclusions: 8 episodes of supraventricular tachycardia longest episode 19 beats at rate of 96. Isolated ventricular ectopy with total burden of 1.2%.   CT SCANS  CT CORONARY MORPH W/CTA COR W/SCORE 04/25/2023  Addendum 05/04/2023 10:18 AM ADDENDUM REPORT: 05/04/2023 10:15  EXAM: OVER-READ INTERPRETATION  CT CHEST  The following report is an over-read performed by radiologist Dr. Fonda Mom [facility] on 05/04/2023. This over-read does not include interpretation of cardiac or coronary anatomy or pathology. The coronary CTA interpretation by the cardiologist is attached.  COMPARISON:  09/29/2021.  FINDINGS: Cardiovascular: Atheromatous calcifications of the aorta. See findings discussed in the body of the report.  Mediastinum/Nodes: No suspicious adenopathy identified. Imaged mediastinal structures are unremarkable.  Lungs/Pleura: There is dependent basilar subsegmental atelectasis. No pneumonia or pulmonary edema. No pleural effusion or pneumothorax.  Upper Abdomen: No acute abnormality.  Musculoskeletal: No chest wall abnormality. No acute or significant osseous findings.  IMPRESSION: No significant extracardiac incidental findings identified.   Electronically Signed By: Fonda Field M.D. On: 05/04/2023 10:15  Narrative CLINICAL DATA:  76F with DOE  EXAM: Cardiac/Coronary CTA  TECHNIQUE: The patient was scanned on a [employer].  FINDINGS: A 100 kV prospective scan was triggered in the descending thoracic aorta at  111 HU's. Axial non-contrast 3 mm slices were carried out through the heart. The data set was analyzed on a dedicated work station and scored using the Agatson method. Gantry rotation speed was 250 msecs and collimation was .6 mm. No beta blockade and 0.8 mg of sl NTG was given. The 3D data set was reconstructed in 5% intervals of the 35-75% of the R-R cycle. Phases were analyzed on a dedicated work station using MPR, MIP and VRT modes. The patient received 100 cc of contrast.  Coronary Arteries:  Normal coronary origin.  Right dominance.  RCA is a large dominant artery that gives rise to PDA and PLA. Mixed plaque in ostial RCA causes 0-24% stenosis. Mixed plaque in proximal RCA causes 50-69% stenosis. Calcified plaque in mid RCA causes 0-24% stenosis. Calcified plaque in distal RCA causes 0-24% stenosis  Left main is a large artery that gives rise to LAD and LCX arteries. Calcified plaque in left main causes 0-24% stenosis  LAD is a large vessel. Calcified plaque in proximal LAD causes 25-49% stenosis  LCX is a non-dominant artery that gives rise to one large OM1 branch. There is no plaque.  Other findings:  Left Ventricle: Normal size  Left Atrium: Mild enlargement. PFO  Pulmonary Veins: Normal configuration  Right Ventricle: Normal size  Right Atrium: Normal size  Cardiac valves: No calcifications  Thoracic aorta: Normal size  Pulmonary Arteries: Normal size  Systemic Veins: Normal drainage  Pericardium: Normal thickness  IMPRESSION: 1. Coronary calcium  score of 436. This was 98th percentile for age and sex matched control.  2. Total plaque volume 328mm3 which is 82nd percentile for age and sex-matched controls (calcified plaque 72mm3; noncalcified plaque 222mm3). 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  Normal coronary origin with right dominance.  4.  Moderate (50-69%) stenosis in proximal RCA  5.  Mild (25-49%) stenosis in proximal LAD  6. Minimal (0-24%) stenosis in left  main and ostial, mid, and distal RCA  7.  PFO  8.  Will send study for CTFFR  CAD-RADS 3. Moderate stenosis. Consider symptom-guided anti-ischemic pharmacotherapy as well as risk factor modification per guideline directed care. Additional analysis with CT FFR will be submitted.  Electronically Signed: By: Lonni Nanas M.D. On: 04/26/2023 12:29     ______________________________________________________________________________________________      Risk Assessment/Calculations:     No BP recorded.  {Refresh Note OR Click here to enter BP  :1}***       Physical Exam:   VS:  There were no vitals taken for this visit.   Wt Readings from Last 3 Encounters:  12/30/23 185 lb (83.9 kg)  04/18/23 170 lb (77.1 kg)  12/02/21 173 lb (78.5 kg)    GEN: Well nourished, well developed in no acute distress NECK: No JVD; No carotid bruits CARDIAC: RRR, no murmurs, rubs, gallops RESPIRATORY:  Clear to auscultation without rales, wheezing or rhonchi  ABDOMEN: Soft, non-tender, non-distended EXTREMITIES:  No edema; No deformity   ASSESSMENT AND PLAN: .   CAD -nonobstructive per left heart cath in 2018 >> coronary CTA 2024 calcium  score 436, 90th percentile, TPV with severe, FFR revealed likely no hemodynamic significance, continue aspirin  81 mg daily, continue Lipitor 80 mg daily, continue nitroglycerin  as needed, continue Ranexa  but will increase to 1000 mg twice daily. On metoprolol  but we will d/c this as she is bradycardiac and symptomatic.   Hypertension-her blood pressure is well-controlled, continue Norvasc  10 mg daily.  Bradycardia-she is also symptomatic, we will simply discontinue her metoprolol , will wean her off it and have her take it every other day for a week, then twice a day for 1 week and then stop.  Tobacco abuse/chewing tobacco-complete cessation since we gave her Chantix  last year however she has replaced this with oral nicotine which she is also trying to stop.   Congratulated her on stopping cigarettes.  Hyperlipidemia -  Her most recent LDL was elevated at 207 and we increased her Lipitor to 80 mg but she did not come back for repeat testing.  Will repeat LFTs and direct LDL today, as well as LP(a).     DM2-she has not seen her PCP in a while, will check her A1c, she has also had weight gain of 15 pounds which is most likely from increased caloric intake after stopping smoking.  I want a put her on Ozempic  for cardiovascular benefits as well as DM2.  We discussed side effects of Ozempic , and that she can now eat and outs that any medication that we give her so encouraged her to start exercising to the best of her ability and watching her caloric intake.  Dispo: Stop metoprolol .  Start Ozempic  0.25 Milligrams injected in the skin once a week x 4, then 0.5 mg injected in skin once a week x 4 weeks, then 1 mg injected into the skin once weekly x 4 weeks.  Increase Ranexa  to 1000 mg twice daily.  CBC, CMET, direct LDL, LP(a), hemoglobin A1c.  Follow-up in 3 months. I spent > 40 minutes of direct patient care today.   Signed, Delon BROCKS  [place], NP

## 2024-05-02 ENCOUNTER — Ambulatory Visit: Admitting: Cardiology

## 2024-05-02 DIAGNOSIS — I10 Essential (primary) hypertension: Secondary | ICD-10-CM

## 2024-05-02 DIAGNOSIS — Z794 Long term (current) use of insulin: Secondary | ICD-10-CM

## 2024-05-02 DIAGNOSIS — R001 Bradycardia, unspecified: Secondary | ICD-10-CM

## 2024-05-02 DIAGNOSIS — I25118 Atherosclerotic heart disease of native coronary artery with other forms of angina pectoris: Secondary | ICD-10-CM

## 2024-05-02 DIAGNOSIS — Z720 Tobacco use: Secondary | ICD-10-CM

## 2024-05-02 DIAGNOSIS — E782 Mixed hyperlipidemia: Secondary | ICD-10-CM

## 2024-05-14 NOTE — Progress Notes (Unsigned)
 Cardiology Office Note:  .   Date:  05/16/2024  ID:  Kathy Moss, DOB Feb 27, 1961, MRN [id number] PCP: Jackolyn Darice BROCKS, FNP  [facility] Providers Cardiologist:  Lamar Fitch, MD    History of Present Illness: .    Kathy Moss is a 63 y.o. female with a past medical history of CAD nonobstructive per left heart cath in 2018, hypertension, hypotension, migraine, TIA, COPD 2 lpm, DM 2, CKD stage IIIa, seizure disorder, dyslipidemia, tobacco abuse.  04/26/2023 coronary CTA calcium  score 436, 90th percentile, T PV with severe, FFR revealed likely no hemodynamic significance 04/18/2023 monitor average heart rate 85 bpm, predominant rhythm was sinus, 8 episodes of SVT, VE burden 1.2% 12/03/2021 Lexiscan  low risk study 03/16/2017 left heart cath nonobstructive CAD  Evaluated by Dr. Fitch on 10/22/2021, she was stable from a cardiac perspective, trying to stop smoking.  Lexiscan  was arranged which revealed low risk study.  Evaluated by myself most recently in August to 2024 she was accompanied by her caregiver, she had some DOE but felt it was related to her COPD.  She was bothered of feeling like her heart was racing at time.  We arranged for a monitor which was overall unremarkable, as well as a coronary CTA revealed a calcium  score of 436, FFR revealed not hemodynamically significant.  We also started her on Chantix  to help with smoking cessation and advised her to follow-up in 4 weeks.  Most recently evaluated by myself on 12/30/2023, she had completely stop smoking but also gained 15 pounds, she was bradycardic and symptomatic so we stopped her beta-blocker, we increased her Ranexa  to see if that would help with episodes of chest pain, and started her on Ozempic  with plans to follow-up in 3 months.  She presents today for follow-up.  Previously started her on Ozempic  however this caused her significant GI distress and she was not able to tolerate this.  She has lost approximately 7 pounds  though.  Overall, she is feeling well, she has occasional episodes of chest pain however she mention she started taking her Ranexa  once daily as opposed to twice daily.  She is not very physically active, she is limited secondary to joint and back pain.  Follows closely with her PCP. She denies palpitations, dyspnea, pnd, orthopnea, n, v, dizziness, syncope, edema, weight gain, or early satiety.    ROS: Review of Systems  Constitutional: Negative.   HENT: Negative.    Eyes: Negative.   Cardiovascular:  Positive for chest pain.  Gastrointestinal: Negative.   Genitourinary: Negative.   Musculoskeletal: Negative.   Skin: Negative.   Endo/Heme/Allergies: Negative.      Studies Reviewed: .        Cardiac Studies & Procedures   ______________________________________________________________________________________________ CARDIAC CATHETERIZATION  CARDIAC CATHETERIZATION 03/16/2017  Conclusion  Mid RCA lesion, 40 %stenosed.  1st Mrg lesion, 10 %stenosed.  The left ventricular systolic function is normal.  LV end diastolic pressure is normal.  The left ventricular ejection fraction is 55-65% by visual estimate.  There is no aortic valve stenosis.  Nonobstructive CAD.  Continue aggressive preventive therapy including smoking cessation.  Findings Coronary Findings Diagnostic  Dominance: Right  Ramus Intermedius Vessel is small.  Left Circumflex  First Obtuse Marginal Branch  Right Coronary Artery  Intervention  No interventions have been documented.   STRESS TESTS  MYOCARDIAL PERFUSION IMAGING 12/02/2021  Interpretation Summary   Findings are consistent with no ischemia and no prior myocardial infarction. The study is low  risk.   No ST deviation was noted.   Left ventricular function is normal. Nuclear stress EF: 68 %. The left ventricular ejection fraction is hyperdynamic (>65%). End diastolic cavity size is normal.   Prior study not available for comparison.       MONITORS  LONG TERM MONITOR (3-14 DAYS) 05/12/2023  Narrative Patch Wear Time:  13 days and 23 hours (2024-08-19T16:37:23-0400 to 2024-09-02T15:56:09-398)  Patient had a min HR of 38 bpm, max HR of 164 bpm, and avg HR of 55 bpm. Predominant underlying rhythm was Sinus Rhythm. 8 Supraventricular Tachycardia runs occurred, the run with the fastest interval lasting 4 beats with a max rate of 164 bpm, the longest lasting 19 beats with an avg rate of 96 bpm. Isolated SVEs were rare (<1.0%), SVE Couplets were rare (<1.0%), and SVE Triplets were rare (<1.0%). Isolated VEs were occasional (1.2%, 13094), VE Couplets were rare (<1.0%, 1), and VE Triplets were rare (<1.0%, 1). Ventricular Trigeminy was present. MD notification criteria for Symptomatic Bradycardia met - report posted prior to notification per account request (MB).  Summary and conclusions: 8 episodes of supraventricular tachycardia longest episode 19 beats at rate of 96. Isolated ventricular ectopy with total burden of 1.2%.   CT SCANS  CT CORONARY MORPH W/CTA COR W/SCORE 04/25/2023  Addendum 05/04/2023 10:18 AM ADDENDUM REPORT: 05/04/2023 10:15  EXAM: OVER-READ INTERPRETATION  CT CHEST  The following report is an over-read performed by radiologist Dr. Fonda Mom [facility] on 05/04/2023. This over-read does not include interpretation of cardiac or coronary anatomy or pathology. The coronary CTA interpretation by the cardiologist is attached.  COMPARISON:  09/29/2021.  FINDINGS: Cardiovascular: Atheromatous calcifications of the aorta. See findings discussed in the body of the report.  Mediastinum/Nodes: No suspicious adenopathy identified. Imaged mediastinal structures are unremarkable.  Lungs/Pleura: There is dependent basilar subsegmental atelectasis. No pneumonia or pulmonary edema. No pleural effusion or pneumothorax.  Upper Abdomen: No acute abnormality.  Musculoskeletal: No chest wall  abnormality. No acute or significant osseous findings.  IMPRESSION: No significant extracardiac incidental findings identified.   Electronically Signed By: Fonda Field M.D. On: 05/04/2023 10:15  Narrative CLINICAL DATA:  23F with DOE  EXAM: Cardiac/Coronary CTA  TECHNIQUE: The patient was scanned on a [employer].  FINDINGS: A 100 kV prospective scan was triggered in the descending thoracic aorta at 111 HU's. Axial non-contrast 3 mm slices were carried out through the heart. The data set was analyzed on a dedicated work station and scored using the Agatson method. Gantry rotation speed was 250 msecs and collimation was .6 mm. No beta blockade and 0.8 mg of sl NTG was given. The 3D data set was reconstructed in 5% intervals of the 35-75% of the R-R cycle. Phases were analyzed on a dedicated work station using MPR, MIP and VRT modes. The patient received 100 cc of contrast.  Coronary Arteries:  Normal coronary origin.  Right dominance.  RCA is a large dominant artery that gives rise to PDA and PLA. Mixed plaque in ostial RCA causes 0-24% stenosis. Mixed plaque in proximal RCA causes 50-69% stenosis. Calcified plaque in mid RCA causes 0-24% stenosis. Calcified plaque in distal RCA causes 0-24% stenosis  Left main is a large artery that gives rise to LAD and LCX arteries. Calcified plaque in left main causes 0-24% stenosis  LAD is a large vessel. Calcified plaque in proximal LAD causes 25-49% stenosis  LCX is a non-dominant artery that gives rise to one large OM1 branch.  There is no plaque.  Other findings:  Left Ventricle: Normal size  Left Atrium: Mild enlargement. PFO  Pulmonary Veins: Normal configuration  Right Ventricle: Normal size  Right Atrium: Normal size  Cardiac valves: No calcifications  Thoracic aorta: Normal size  Pulmonary Arteries: Normal size  Systemic Veins: Normal drainage  Pericardium: Normal  thickness  IMPRESSION: 1. Coronary calcium  score of 436. This was 98th percentile for age and sex matched control.  2. Total plaque volume 361mm3 which is 82nd percentile for age and sex-matched controls (calcified plaque 80mm3; noncalcified plaque 255mm3). 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  Normal coronary origin with right dominance.  4.  Moderate (50-69%) stenosis in proximal RCA  5.  Mild (25-49%) stenosis in proximal LAD  6. Minimal (0-24%) stenosis in left main and ostial, mid, and distal RCA  7.  PFO  8.  Will send study for CTFFR  CAD-RADS 3. Moderate stenosis. Consider symptom-guided anti-ischemic pharmacotherapy as well as risk factor modification per guideline directed care. Additional analysis with CT FFR will be submitted.  Electronically Signed: By: Lonni Nanas M.D. On: 04/26/2023 12:29     ______________________________________________________________________________________________      Risk Assessment/Calculations:             Physical Exam:   VS:  BP 120/72   Pulse 62   Ht 5' 5 (1.651 m)   Wt 178 lb (80.7 kg)   SpO2 94%   BMI 29.62 kg/m    Wt Readings from Last 3 Encounters:  05/16/24 178 lb (80.7 kg)  12/30/23 185 lb (83.9 kg)  04/18/23 170 lb (77.1 kg)    GEN: Well nourished, well developed in no acute distress NECK: No JVD; No carotid bruits CARDIAC: RRR, no murmurs, rubs, gallops RESPIRATORY:  Clear to auscultation without rales, wheezing or rhonchi  ABDOMEN: Soft, non-tender, non-distended EXTREMITIES:  No edema; No deformity   ASSESSMENT AND PLAN: .   CAD -nonobstructive per left heart cath in 2018 >> coronary CTA 2024 calcium  score 436, 90th percentile, TPV with severe, FFR revealed likely no hemodynamic significance, continue aspirin  81 mg daily, continue Lipitor 80 mg daily, continue nitroglycerin  as needed, continue Ranexa  500 mg twice daily. Bradycardiac and symptomatic on beta blocker, d/c'd as last OV and is feeling better.    Hypertension-her blood pressure is well-controlled at 120/72, continue Norvasc  10 mg daily.  Tobacco abuse/chewing tobacco-complete cessation since we gave her Chantix  last year however she has replaced this with oral nicotine which she is also trying to stop.  Congratulated her on stopping cigarettes.  Hyperlipidemia -  Her most recent LDL 80, continue Lipitor 80 mg daily.      Dm2-managed by PCP.   Dispo: Decrease Ranexa  to 500 mg twice daily, follow up in 6 months.   Signed, Delon JAYSON Hoover, NP

## 2024-05-16 ENCOUNTER — Ambulatory Visit: Attending: Cardiology | Admitting: Cardiology

## 2024-05-16 ENCOUNTER — Encounter: Payer: Self-pay | Admitting: Cardiology

## 2024-05-16 VITALS — BP 120/72 | HR 62 | Ht 65.0 in | Wt 178.0 lb

## 2024-05-16 DIAGNOSIS — Z720 Tobacco use: Secondary | ICD-10-CM

## 2024-05-16 DIAGNOSIS — I10 Essential (primary) hypertension: Secondary | ICD-10-CM

## 2024-05-16 DIAGNOSIS — R001 Bradycardia, unspecified: Secondary | ICD-10-CM | POA: Diagnosis not present

## 2024-05-16 DIAGNOSIS — I25118 Atherosclerotic heart disease of native coronary artery with other forms of angina pectoris: Secondary | ICD-10-CM | POA: Diagnosis not present

## 2024-05-16 DIAGNOSIS — E118 Type 2 diabetes mellitus with unspecified complications: Secondary | ICD-10-CM

## 2024-05-16 DIAGNOSIS — E782 Mixed hyperlipidemia: Secondary | ICD-10-CM

## 2024-05-16 DIAGNOSIS — Z794 Long term (current) use of insulin: Secondary | ICD-10-CM

## 2024-05-16 MED ORDER — RANOLAZINE ER 1000 MG PO TB12: 1000.0000 mg | ORAL_TABLET | ORAL | 3 refills | Status: DC

## 2024-05-16 NOTE — Patient Instructions (Signed)

## 2024-06-04 ENCOUNTER — Telehealth: Payer: Self-pay | Admitting: *Deleted

## 2024-06-04 MED ORDER — RANOLAZINE ER 1000 MG PO TB12: 1000.0000 mg | ORAL_TABLET | ORAL | 3 refills | Status: DC

## 2024-06-04 NOTE — Telephone Encounter (Signed)
 Rx refill sent to pharmacy.

## 2024-06-20 ENCOUNTER — Telehealth (HOSPITAL_BASED_OUTPATIENT_CLINIC_OR_DEPARTMENT_OTHER): Payer: Self-pay | Admitting: *Deleted

## 2024-06-20 NOTE — Telephone Encounter (Signed)
   Patient Name: Kathy Moss  DOB: 09-Jun-1961 MRN: [id number]  Primary Cardiologist: Lamar Fitch, MD  Chart reviewed as part of pre-operative protocol coverage. Given past medical history and time since last visit, based on ACC/AHA guidelines, CHASTITY NOLAND is at acceptable risk for the planned procedure without further cardiovascular testing.   Yes, she is able to meet greater than 4 METS of physical activity and she is optimized from a cardiac perspective to undergo her colonoscopy. May hold ASA for 7 days and Plavix  for 5 days, restart ASAP after procedure if hemodynamically stable.   The patient was advised that if she develops new symptoms prior to surgery to contact our office to arrange for a follow-up visit, and she verbalized understanding.  I will route this recommendation to the requesting party via Epic fax function and remove from pre-op  pool.  Please call with questions.  Lamarr Satterfield, NP 06/20/2024, 10:23 AM

## 2024-06-20 NOTE — Telephone Encounter (Signed)
 Delon,  You saw this patient on 05/16/2024. Per protocol we request that you comment on his cardiac risk to proceed with colonoscopy and hold Plavix  and ASA for 5 days since it has been less than 2 months since evaluated in the office. Please send your comment to P CV Pre-Op  Pool.  Thank you, Lamarr Satterfield DNP, ANP, AACC.

## 2024-06-20 NOTE — Telephone Encounter (Signed)
   Pre-operative Risk Assessment    Patient Name: Kathy Moss  DOB: [birth year] MRN: [id number]   Date of last office visit: 05/16/2024 Date of next office visit: None   Request for Surgical Clearance    Procedure:  Colonoscopy  Date of Surgery:  Clearance TBD                                 Surgeon:  Dr. Murriel Socks Group or Practice Name:  [facility] Phone number:  [phone] Fax number:  [phone]   Type of Clearance Requested:   - Medical  - Pharmacy:  Hold Aspirin  and Clopidogrel  (Plavix ) 5-7 days   Type of Anesthesia:  Not Indicated   Additional requests/questions:    Signed, Edsel Grayce Sanders   06/20/2024, 8:11 AM

## 2024-07-03 ENCOUNTER — Telehealth: Payer: Self-pay | Admitting: *Deleted

## 2024-07-03 MED ORDER — RANOLAZINE ER 500 MG PO TB12: 1000.0000 mg | ORAL_TABLET | ORAL | 3 refills | Status: DC

## 2024-07-03 NOTE — Telephone Encounter (Signed)
 Rx refill sent to pharmacy.

## 2024-07-24 ENCOUNTER — Telehealth: Payer: Self-pay | Admitting: *Deleted

## 2024-07-24 MED ORDER — RANOLAZINE ER 500 MG PO TB12: 500.0000 mg | ORAL_TABLET | Freq: Two times a day (BID) | ORAL | 2 refills | Status: AC

## 2024-07-24 NOTE — Telephone Encounter (Signed)
 Medication clarification: Ranexa  500 mg bid. The way RX was sent in was confusing. Will send in the 500 mg bid again to clarify the RX.

## 2024-10-04 ENCOUNTER — Telehealth: Payer: Self-pay | Admitting: *Deleted

## 2024-10-04 MED ORDER — ATORVASTATIN CALCIUM 80 MG PO TABS: 80.0000 mg | ORAL_TABLET | Freq: Every day | ORAL | 2 refills | Status: AC

## 2024-10-04 MED ORDER — CLOPIDOGREL BISULFATE 75 MG PO TABS: 75.0000 mg | ORAL_TABLET | Freq: Every day | ORAL | 2 refills | Status: AC

## 2024-10-04 MED ORDER — AMLODIPINE BESYLATE 10 MG PO TABS: 10.0000 mg | ORAL_TABLET | Freq: Every day | ORAL | 2 refills | Status: AC

## 2024-10-04 NOTE — Telephone Encounter (Signed)
 Rx refill sent to pharmacy.
